# Patient Record
Sex: Male | Born: 1960 | Race: White | Hispanic: No | Marital: Single | Smoking: Never smoker
Health system: Southern US, Community
[De-identification: ages and names within clinical notes are randomized; demographics above are authoritative.]

## PROBLEM LIST (undated history)

## (undated) ENCOUNTER — Emergency Department (HOSPITAL_COMMUNITY): Payer: Self-pay

## (undated) DIAGNOSIS — Z973 Presence of spectacles and contact lenses: Secondary | ICD-10-CM

## (undated) DIAGNOSIS — M545 Low back pain, unspecified: Secondary | ICD-10-CM

## (undated) DIAGNOSIS — S37019A Minor contusion of unspecified kidney, initial encounter: Secondary | ICD-10-CM

## (undated) DIAGNOSIS — F419 Anxiety disorder, unspecified: Secondary | ICD-10-CM

## (undated) DIAGNOSIS — F319 Bipolar disorder, unspecified: Secondary | ICD-10-CM

## (undated) DIAGNOSIS — M25471 Effusion, right ankle: Secondary | ICD-10-CM

## (undated) DIAGNOSIS — G8929 Other chronic pain: Secondary | ICD-10-CM

## (undated) DIAGNOSIS — G4730 Sleep apnea, unspecified: Secondary | ICD-10-CM

## (undated) DIAGNOSIS — I5032 Chronic diastolic (congestive) heart failure: Secondary | ICD-10-CM

## (undated) DIAGNOSIS — K219 Gastro-esophageal reflux disease without esophagitis: Secondary | ICD-10-CM

## (undated) DIAGNOSIS — G4700 Insomnia, unspecified: Secondary | ICD-10-CM

## (undated) DIAGNOSIS — M25474 Effusion, right foot: Secondary | ICD-10-CM

## (undated) DIAGNOSIS — I10 Essential (primary) hypertension: Secondary | ICD-10-CM

## (undated) DIAGNOSIS — B1920 Unspecified viral hepatitis C without hepatic coma: Secondary | ICD-10-CM

## (undated) DIAGNOSIS — M25472 Effusion, left ankle: Secondary | ICD-10-CM

## (undated) DIAGNOSIS — Z86010 Personal history of colonic polyps: Secondary | ICD-10-CM

## (undated) DIAGNOSIS — R519 Headache, unspecified: Secondary | ICD-10-CM

## (undated) DIAGNOSIS — M25475 Effusion, left foot: Secondary | ICD-10-CM

## (undated) DIAGNOSIS — R51 Headache: Secondary | ICD-10-CM

## (undated) DIAGNOSIS — R296 Repeated falls: Secondary | ICD-10-CM

## (undated) DIAGNOSIS — R413 Other amnesia: Secondary | ICD-10-CM

## (undated) DIAGNOSIS — D131 Benign neoplasm of stomach: Secondary | ICD-10-CM

## (undated) HISTORY — PX: COLONOSCOPY: SHX174

## (undated) HISTORY — DX: Benign neoplasm of stomach: D13.1

## (undated) HISTORY — PX: FRACTURE SURGERY: SHX138

## (undated) HISTORY — DX: Bipolar disorder, unspecified: F31.9

## (undated) HISTORY — DX: Insomnia, unspecified: G47.00

## (undated) HISTORY — DX: Personal history of colonic polyps: Z86.010

## (undated) HISTORY — DX: Sleep apnea, unspecified: G47.30

---
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
## 1999-04-24 HISTORY — PX: ANKLE FRACTURE SURGERY: SHX122

## 1999-04-24 HISTORY — PX: FOREARM SURGERY: SHX651

## 2011-11-07 ENCOUNTER — Emergency Department (HOSPITAL_COMMUNITY): Payer: Self-pay

## 2011-11-07 ENCOUNTER — Emergency Department (HOSPITAL_COMMUNITY)
Admission: EM | Admit: 2011-11-07 | Discharge: 2011-11-07 | Disposition: A | Discharge status: Home / Self Care | Payer: Self-pay | Attending: Emergency Medicine | Admitting: Emergency Medicine

## 2011-11-07 ENCOUNTER — Encounter (HOSPITAL_COMMUNITY): Payer: Self-pay | Admitting: *Deleted

## 2011-11-07 DIAGNOSIS — K219 Gastro-esophageal reflux disease without esophagitis: Secondary | ICD-10-CM | POA: Insufficient documentation

## 2011-11-07 DIAGNOSIS — M542 Cervicalgia: Secondary | ICD-10-CM | POA: Insufficient documentation

## 2011-11-07 DIAGNOSIS — M549 Dorsalgia, unspecified: Secondary | ICD-10-CM | POA: Insufficient documentation

## 2011-11-07 DIAGNOSIS — B1920 Unspecified viral hepatitis C without hepatic coma: Secondary | ICD-10-CM | POA: Insufficient documentation

## 2011-11-07 DIAGNOSIS — M1990 Unspecified osteoarthritis, unspecified site: Secondary | ICD-10-CM

## 2011-11-07 DIAGNOSIS — W19XXXA Unspecified fall, initial encounter: Secondary | ICD-10-CM

## 2011-11-07 HISTORY — DX: Unspecified viral hepatitis C without hepatic coma: B19.20

## 2011-11-07 HISTORY — DX: Gastro-esophageal reflux disease without esophagitis: K21.9

## 2011-11-07 MED ORDER — CEPHALEXIN 500 MG PO CAPS: 500.0000 mg | ORAL_CAPSULE | Freq: Four times a day (QID) | ORAL | Status: DC

## 2011-11-07 MED ORDER — CARISOPRODOL 350 MG PO TABS: 350.0000 mg | ORAL_TABLET | Freq: Three times a day (TID) | ORAL | Status: AC

## 2011-11-07 MED ORDER — HYDROCODONE-ACETAMINOPHEN 10-325 MG PO TABS: 1.0000 | ORAL_TABLET | Freq: Three times a day (TID) | ORAL | Status: DC | PRN

## 2011-11-07 NOTE — ED Provider Notes (Signed)
History   This chart was scribed for Dot Lanes, MD by Kathreen Cornfield. The patient was seen in room TR07C/TR07C and the patient's care was started at 11:06 AM     CSN: [id number]  Arrival date & time 11/07/11  1011   First MD Initiated Contact with Patient 11/07/11 1105      Chief Complaint  Patient presents with  . Fall  . Back Pain  . Neck Pain    (Consider location/radiation/quality/duration/timing/severity/associated sxs/prior treatment) Patient is a 51 y.o. male presenting with fall, back pain, and neck pain. The history is provided by the patient. No language interpreter was used.  Fall The accident occurred more than 1 week ago. The fall occurred in unknown circumstances. He fell from an unknown height. He landed on a hard floor. There was no blood loss. The point of impact was the head. The pain is present in the neck, right shoulder and left shoulder. The pain is moderate. He was ambulatory at the scene. There was no entrapment after the fall. There was no drug use involved in the accident. There was no alcohol use involved in the accident. Pertinent negatives include no fever, no bowel incontinence, no vomiting and no loss of consciousness. The symptoms are aggravated by extension. He has tried nothing for the symptoms. The treatment provided no relief.  Back Pain  This is a new problem. The current episode started more than 1 week ago. The problem occurs constantly. The problem has been gradually worsening. The pain is associated with falling. The pain is moderate. The symptoms are aggravated by certain positions. The pain is the same all the time. Pertinent negatives include no fever, no bowel incontinence and no bladder incontinence. He has tried nothing for the symptoms. The treatment provided no relief.  Neck Pain  This is a new problem. The current episode started more than 1 week ago. The problem occurs constantly. The problem has been gradually worsening. The pain is  associated with a fall. There has been no fever. The pain is moderate. The pain is the same all the time. Pertinent negatives include no bowel incontinence and no bladder incontinence. He has tried nothing for the symptoms. The treatment provided no relief.    Christian Guerra is a 51 y.o. male who presents to the Emergency Department complaining of moderate, episodic fall onset three weeks ago with associated symptoms of left sided neck pain, radiating  shoulder pain. The pt informs the EDP that he is experiencing neck pain which radiates down into his shoulder blades. Modifying factors include certain positions or movements which intensify the pain. Pt also complains of moderate, episodic rash onset three weeks ago.   Pt denies loss of bowel control.  The pt is visiting from [place].      Past Medical History  Diagnosis Date  . Hepatitis C   . GERD (gastroesophageal reflux disease)     Past Surgical History  Procedure Date  . Appendectomy   . Ankle surgery   . Cosmetic surgery     No family history on file.  History  Substance Use Topics  . Smoking status: Never Smoker   . Smokeless tobacco: Not on file  . Alcohol Use: Yes      Review of Systems  Constitutional: Negative for fever.  HENT: Positive for neck pain.   Gastrointestinal: Negative for vomiting and bowel incontinence.  Genitourinary: Negative for bladder incontinence.  Musculoskeletal: Positive for back pain.  Neurological: Negative for loss of consciousness.  All other systems reviewed and are negative.   10 Systems reviewed and all are negative for acute change except as noted in the HPI.    Allergies  Codeine  Home Medications   Current Outpatient Rx  Name Route Sig Dispense Refill  . CELECOXIB 200 MG PO CAPS Oral Take 200 mg by mouth daily.    Marland Kitchen CLINDAMYCIN PHOSPHATE 1 % EX LOTN Topical Apply 1 application topically 2 (two) times daily.    Marland Kitchen DIPHENHYDRAMINE HCL 25 MG PO TABS Oral Take 25 mg by  mouth every 6 (six) hours as needed. For allergies    . HYDROCODONE-ACETAMINOPHEN 10-325 MG PO TABS Oral Take 1 tablet by mouth every 8 (eight) hours as needed.    . IBUPROFEN 200 MG PO TABS Oral Take 800 mg by mouth every 6 (six) hours as needed. For pain    . OMEPRAZOLE 40 MG PO CPDR Oral Take 40 mg by mouth daily.    . SERTRALINE HCL 50 MG PO TABS Oral Take 50 mg by mouth daily.    Marland Kitchen TIZANIDINE HCL 4 MG PO TABS Oral Take 4 mg by mouth every 8 (eight) hours as needed.      BP 160/86  Pulse 93  Temp 98.3 F (36.8 C) (Oral)  Resp 16  SpO2 100%  Physical Exam  Nursing note and vitals reviewed. Constitutional: He is oriented to person, place, and time. He appears well-developed and well-nourished.  HENT:  Head: Atraumatic.  Nose: Nose normal.  Neck:    Pulmonary/Chest: Effort normal.  Neurological: He is alert and oriented to person, place, and time. He has normal strength. No sensory deficit. GCS eye subscore is 4. GCS verbal subscore is 5. GCS motor subscore is 6.  Skin: Skin is warm and dry.  Psychiatric: He has a normal mood and affect. His behavior is normal.    ED Course  Procedures (including critical care time)  DIAGNOSTIC STUDIES: Oxygen Saturation is 100% on room air, normal by my interpretation.    COORDINATION OF CARE:     11:09AM- EDP at bedside discusses treatment plan concerning x-rays and pain management.   Labs Reviewed - No data to display No results found.   No diagnosis found.    MDM        I personally performed the services described in this documentation, which was scribed in my presence. The recorded information has been reviewed and considered.     Dot Lanes, MD 11/07/11 [phone]

## 2011-11-07 NOTE — ED Notes (Signed)
Patient states he fell 3 weeks ago.  [address] face forward and hit his face.  He states since the fall his neck and both shoulders are hurting since the fall.  He also complaining of back pain from his neck to mid back.  Patient is also concerned about "jock itch"  He is also requesting a refill on his keflex

## 2011-11-15 ENCOUNTER — Encounter (HOSPITAL_COMMUNITY): Payer: Self-pay | Admitting: Emergency Medicine

## 2011-11-15 ENCOUNTER — Emergency Department (HOSPITAL_COMMUNITY)
Admission: EM | Admit: 2011-11-15 | Discharge: 2011-11-15 | Disposition: A | Discharge status: Home / Self Care | Payer: Self-pay | Attending: Emergency Medicine | Admitting: Emergency Medicine

## 2011-11-15 ENCOUNTER — Emergency Department (HOSPITAL_COMMUNITY): Payer: Self-pay

## 2011-11-15 DIAGNOSIS — S139XXA Sprain of joints and ligaments of unspecified parts of neck, initial encounter: Secondary | ICD-10-CM | POA: Insufficient documentation

## 2011-11-15 DIAGNOSIS — Z79899 Other long term (current) drug therapy: Secondary | ICD-10-CM | POA: Insufficient documentation

## 2011-11-15 DIAGNOSIS — M542 Cervicalgia: Secondary | ICD-10-CM | POA: Insufficient documentation

## 2011-11-15 DIAGNOSIS — K219 Gastro-esophageal reflux disease without esophagitis: Secondary | ICD-10-CM | POA: Insufficient documentation

## 2011-11-15 DIAGNOSIS — S161XXA Strain of muscle, fascia and tendon at neck level, initial encounter: Secondary | ICD-10-CM

## 2011-11-15 DIAGNOSIS — W010XXA Fall on same level from slipping, tripping and stumbling without subsequent striking against object, initial encounter: Secondary | ICD-10-CM | POA: Insufficient documentation

## 2011-11-15 DIAGNOSIS — Z9089 Acquired absence of other organs: Secondary | ICD-10-CM | POA: Insufficient documentation

## 2011-11-15 MED ORDER — OXYCODONE HCL 5 MG PO TABS
10.0000 mg | ORAL_TABLET | Freq: Once | ORAL | Status: AC
  Administered 2011-11-15: 10 mg via ORAL
  Filled 2011-11-15: qty 2

## 2011-11-15 MED ORDER — OXYCODONE HCL 5 MG PO CAPS: 5.0000 mg | ORAL_CAPSULE | ORAL | Status: AC | PRN

## 2011-11-15 MED ORDER — NYSTATIN-TRIAMCINOLONE 100000-0.1 UNIT/GM-% EX CREA: TOPICAL_CREAM | CUTANEOUS | Status: DC

## 2011-11-15 NOTE — ED Provider Notes (Signed)
History  This chart was scribed for Orlie Dakin, MD by Lovena Le Day. This patient was seen in room TR08C/TR08C and the patient's care was started at 1105.   CSN: [id number]  Arrival date & time 11/15/11  1105   None     Chief Complaint  Patient presents with  . Back Pain  . Neck Pain    The history is provided by the patient. No language interpreter was used.   Christian Guerra is a 51 y.o. male who presents to the Emergency Department complaining of constant neck/back pain after he fell four weeks ago , when he tripped and fell, landing on his face. Complains of posterior neck pain and mid thoracic back pain. Pain is nonradiating seen here 11/07/2011 had plain films of cervical spine prescribed Vicodin which she states gives him no relief. No loss of bladder or bowel control no focal numbness or weakness while walking out of his shed. He is currently moving here from [place] and has no PCP yet. He states has tried using ice packs at home without any relief from pain and trouble sleeping. He also states multiple itchy insect bites to his BLE from two weeks ago and using OTC topical treatments. He denies any medical history. He does not smoke and states quit drinking about a week and a half ago.      Past Medical History  Diagnosis Date  . Hepatitis C   . GERD (gastroesophageal reflux disease)     Past Surgical History  Procedure Date  . Appendectomy   . Ankle surgery   . Cosmetic surgery     No family history on file.  History  Substance Use Topics  . Smoking status: Never Smoker   . Smokeless tobacco: Not on file  . Alcohol Use: Yes      Review of Systems  Constitutional: Negative.  Negative for fever.  HENT: Positive for neck pain.   Respiratory: Negative.   Cardiovascular: Negative.  Negative for chest pain.  Gastrointestinal: Negative.  Negative for abdominal pain.  Musculoskeletal: Positive for back pain.  Skin: Positive for rash. Wound: multiple insect bites  BLE.       Insect bites over lower legs which are improving with time treated with peroxide. Also complains of jock itch for several days.  Neurological: Negative.   Hematological: Negative.   Psychiatric/Behavioral: Negative.     Allergies  Codeine  Home Medications   Current Outpatient Rx  Name Route Sig Dispense Refill  . BC FAST PAIN RELIEF PO Oral Take 1 packet by mouth 2 (two) times daily as needed. For pain    . CARISOPRODOL 350 MG PO TABS Oral Take 1 tablet (350 mg total) by mouth 3 (three) times daily. 30 tablet 0  . CELECOXIB 200 MG PO CAPS Oral Take 200 mg by mouth daily.    Marland Kitchen CLINDAMYCIN PHOSPHATE 1 % EX LOTN Topical Apply 1 application topically 2 (two) times daily.    Marland Kitchen DIPHENHYDRAMINE HCL 25 MG PO TABS Oral Take 25 mg by mouth every 6 (six) hours as needed. For allergies    . FAMOTIDINE 10 MG PO TABS Oral Take 10 mg by mouth 2 (two) times daily.    Marland Kitchen HYDROCODONE-ACETAMINOPHEN 10-325 MG PO TABS Oral Take 1 tablet by mouth every 8 (eight) hours as needed. 30 tablet 0  . IBUPROFEN 200 MG PO TABS Oral Take 800 mg by mouth every 6 (six) hours as needed. For pain    . BENGAY ULTRA STRENGTH  EX Apply externally Apply 1 application topically 4 (four) times daily.    Marland Kitchen BACITRACIN-NEOMYCIN-POLYMYXIN 400-08-4998 EX OINT Topical Apply 1 application topically every 12 (twelve) hours.    . NYSTATIN-TRIAMCINOLONE 100000-0.1 UNIT/GM-% EX CREA Topical Apply 1 application topically 2 (two) times daily.    Marland Kitchen OMEPRAZOLE 40 MG PO CPDR Oral Take 40 mg by mouth daily.      BP 156/88  Pulse 107  Temp 97.8 F (36.6 C) (Oral)  Resp 16  SpO2 96%  Physical Exam  Nursing note and vitals reviewed. Constitutional: He appears well-developed and well-nourished. He appears distressed (appears uncomfortable).       Appears uncomfortable Glasgow Coma Score 15  HENT:  Head: Normocephalic and atraumatic.  Eyes: Conjunctivae are normal. Pupils are equal, round, and reactive to light.  Neck: Neck  supple. No tracheal deviation present. No thyromegaly present.  Cardiovascular: Normal rate and regular rhythm.   No murmur heard. Pulmonary/Chest: Effort normal and breath sounds normal.  Abdominal: Soft. Bowel sounds are normal. He exhibits no distension. There is no tenderness.       Obese  Musculoskeletal: Normal range of motion. He exhibits tenderness. He exhibits no edema.       Tender over cervical spine tender over mid thoracic spine lumbar spine nontender pelvis stable  Neurological: He is alert. He displays normal reflexes. Coordination normal.       Gait normal  Skin: Skin is warm and dry. No rash noted.       Pinkish rash in groin consistent with tinea cruris. Pinkish papules on lower legs consistent with healing insect bites.  Psychiatric: He has a normal mood and affect.    ED Course  Procedures (including critical care time) DIAGNOSTIC STUDIES: Oxygen Saturation is 96% on room air, adequate by my interpretation.    COORDINATION OF CARE: At 1210 PM Discussed treatment plan with patient which includes CT head/neck. Patient agrees.   Labs Reviewed - No data to display No results found.  1 50 p.m. feels improved after treatment with oxycodone. X-rays reviewed by me. No diagnosis found.    MDM  Plan prescription oxyir, Mycolog II cream referral resource guide, blood pressure recheck  Diagnosis #1 cervical strain #2 thoracic back pain #3 tinea cruris #4 elevated blood pressure   I personally performed the services described in this documentation, which was scribed in my presence. The recorded information has been reviewed and considered.        Orlie Dakin, MD 11/15/11 1355

## 2011-11-15 NOTE — ED Notes (Signed)
Pt reports fall 4 week ago continues to have neck and back pain. Pt reports using ice packs and heating pad without relief. Pt also reports not able to sleep. Pt has multiple bite marks from bugs on B/L legs onset 2 weeks ago. Pt also requesting medication for jock itch.

## 2011-11-15 NOTE — ED Notes (Signed)
Patient seen of [facility] for same injury, given pain medications and is now out of medications and has no family doctor.  Continues to have pain in the shoulder blade area and radiates to neck.  Continues to have back pain as well.

## 2011-11-16 MED ORDER — TRIAMCINOLONE ACETONIDE 0.1 % EX CREA: TOPICAL_CREAM | Freq: Two times a day (BID) | CUTANEOUS | Status: DC

## 2011-11-16 MED ORDER — NYSTATIN 100000 UNIT/GM EX CREA: TOPICAL_CREAM | CUTANEOUS | Status: DC

## 2011-11-22 ENCOUNTER — Ambulatory Visit: Payer: Self-pay | Admitting: Family

## 2012-09-19 ENCOUNTER — Ambulatory Visit: Payer: No Typology Code available for payment source | Attending: Internal Medicine | Admitting: Internal Medicine

## 2012-09-19 ENCOUNTER — Encounter: Payer: Self-pay | Admitting: Internal Medicine

## 2012-09-19 VITALS — BP 122/81 | HR 72 | Temp 98.4°F | Resp 18 | Wt 278.0 lb

## 2012-09-19 DIAGNOSIS — Z9119 Patient's noncompliance with other medical treatment and regimen: Secondary | ICD-10-CM | POA: Insufficient documentation

## 2012-09-19 DIAGNOSIS — G4733 Obstructive sleep apnea (adult) (pediatric): Secondary | ICD-10-CM | POA: Insufficient documentation

## 2012-09-19 DIAGNOSIS — B1920 Unspecified viral hepatitis C without hepatic coma: Secondary | ICD-10-CM | POA: Insufficient documentation

## 2012-09-19 DIAGNOSIS — I10 Essential (primary) hypertension: Secondary | ICD-10-CM | POA: Insufficient documentation

## 2012-09-19 DIAGNOSIS — G894 Chronic pain syndrome: Secondary | ICD-10-CM | POA: Insufficient documentation

## 2012-09-19 DIAGNOSIS — G8929 Other chronic pain: Secondary | ICD-10-CM

## 2012-09-19 DIAGNOSIS — M549 Dorsalgia, unspecified: Secondary | ICD-10-CM

## 2012-09-19 DIAGNOSIS — Z91199 Patient's noncompliance with other medical treatment and regimen due to unspecified reason: Secondary | ICD-10-CM | POA: Insufficient documentation

## 2012-09-19 DIAGNOSIS — B182 Chronic viral hepatitis C: Secondary | ICD-10-CM | POA: Insufficient documentation

## 2012-09-19 DIAGNOSIS — N63 Unspecified lump in unspecified breast: Secondary | ICD-10-CM

## 2012-09-19 DIAGNOSIS — Z79899 Other long term (current) drug therapy: Secondary | ICD-10-CM | POA: Insufficient documentation

## 2012-09-19 DIAGNOSIS — K219 Gastro-esophageal reflux disease without esophagitis: Secondary | ICD-10-CM | POA: Insufficient documentation

## 2012-09-19 DIAGNOSIS — Z0000 Encounter for general adult medical examination without abnormal findings: Secondary | ICD-10-CM | POA: Insufficient documentation

## 2012-09-19 MED ORDER — GABAPENTIN 300 MG PO CAPS: 300.0000 mg | ORAL_CAPSULE | Freq: Three times a day (TID) | ORAL | Status: DC

## 2012-09-19 MED ORDER — TIZANIDINE HCL 4 MG PO CAPS: 4.0000 mg | ORAL_CAPSULE | Freq: Three times a day (TID) | ORAL | Status: DC

## 2012-09-19 MED ORDER — LISINOPRIL 10 MG PO TABS: 10.0000 mg | ORAL_TABLET | Freq: Every day | ORAL | Status: DC
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
## 2012-09-19 NOTE — Progress Notes (Signed)
Patient here to establish care Medication refills

## 2012-09-22 NOTE — Addendum Note (Signed)
Addended by: Jonetta Osgood on: 09/22/2012 10:25 AM   Modules accepted: Orders

## 2012-09-23 ENCOUNTER — Ambulatory Visit: Payer: No Typology Code available for payment source | Attending: Family Medicine

## 2012-09-23 DIAGNOSIS — K219 Gastro-esophageal reflux disease without esophagitis: Secondary | ICD-10-CM

## 2012-09-23 DIAGNOSIS — I10 Essential (primary) hypertension: Secondary | ICD-10-CM

## 2012-09-23 DIAGNOSIS — G8929 Other chronic pain: Secondary | ICD-10-CM

## 2012-09-24 LAB — COMPREHENSIVE METABOLIC PANEL
ALT: 17 U/L (ref 0–53)
AST: 14 U/L (ref 0–37)
Albumin: 4 g/dL (ref 3.5–5.2)
Calcium: 8.7 mg/dL (ref 8.4–10.5)
Chloride: 99 mEq/L (ref 96–112)
Creat: 0.99 mg/dL (ref 0.50–1.35)
Potassium: 4.5 mEq/L (ref 3.5–5.3)
Sodium: 136 mEq/L (ref 135–145)
Total Protein: 6.9 g/dL (ref 6.0–8.3)

## 2012-09-24 LAB — LIPID PANEL
Cholesterol: 152 mg/dL (ref 0–200)
Total CHOL/HDL Ratio: 4 Ratio
VLDL: 23 mg/dL (ref 0–40)

## 2012-09-24 LAB — CBC
Platelets: 398 10*3/uL (ref 150–400)
RDW: 15.1 % (ref 11.5–15.5)
WBC: 9.7 10*3/uL (ref 4.0–10.5)

## 2012-09-24 LAB — HEPATITIS PANEL, ACUTE: Hep A IgM: NEGATIVE

## 2012-09-25 ENCOUNTER — Telehealth: Payer: Self-pay | Admitting: Family Medicine

## 2012-09-25 NOTE — Telephone Encounter (Signed)
Question about order for Dr. Ivor Reining

## 2012-09-26 ENCOUNTER — Encounter: Payer: Self-pay | Admitting: Internal Medicine

## 2012-09-26 ENCOUNTER — Ambulatory Visit: Payer: No Typology Code available for payment source | Admitting: Family Medicine

## 2012-09-26 ENCOUNTER — Ambulatory Visit (INDEPENDENT_AMBULATORY_CARE_PROVIDER_SITE_OTHER): Payer: No Typology Code available for payment source | Admitting: Internal Medicine

## 2012-09-26 VITALS — BP 130/80 | HR 90 | Temp 98.9°F | Ht 64.5 in | Wt 278.0 lb

## 2012-09-26 VITALS — BP 125/79 | HR 85 | Temp 98.2°F | Resp 16

## 2012-09-26 DIAGNOSIS — F518 Other sleep disorders not due to a substance or known physiological condition: Secondary | ICD-10-CM

## 2012-09-26 DIAGNOSIS — G8929 Other chronic pain: Secondary | ICD-10-CM | POA: Insufficient documentation

## 2012-09-26 DIAGNOSIS — IMO0001 Reserved for inherently not codable concepts without codable children: Secondary | ICD-10-CM

## 2012-09-26 DIAGNOSIS — M549 Dorsalgia, unspecified: Secondary | ICD-10-CM | POA: Insufficient documentation

## 2012-09-26 DIAGNOSIS — I10 Essential (primary) hypertension: Secondary | ICD-10-CM

## 2012-09-26 DIAGNOSIS — Z72821 Inadequate sleep hygiene: Secondary | ICD-10-CM

## 2012-09-26 LAB — CK: Total CK: 119 U/L (ref 7–232)

## 2012-09-26 NOTE — Progress Notes (Signed)
  Subjective:    Patient ID: Christian Guerra, male    DOB: 1961/02/14  MRN: [id number]  HPI  86 yowm never smoker referred by Dr Jonnie Finner 09/26/2012 to pulmonary clinic for eval of ? Sleep disorder  09/26/2012 1st pulmonary ov cc poor sleep since 1992 rec eval by sleep center in [place] but never completed it. 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  Since onset of this sleeping pattern has also developed   aching all over and requiring large doses of hydrocodone but actually denies hypersomnolence.  No obvious daytime variabilty or assoc chronic cough or cp or chest tightness, subjective wheeze overt sinus or hb symptoms. No unusual exp hx or h/o childhood pna/ asthma or premature birth to his knowledge.   Review of Systems  Constitutional: Positive for appetite change. Negative for fever, chills, activity change and unexpected weight change.  HENT: Negative for congestion, sore throat, rhinorrhea, sneezing, trouble swallowing, dental problem, voice change and postnasal drip.   Eyes: Negative for visual disturbance.  Respiratory: Negative for cough, choking and shortness of breath.   Cardiovascular: Negative for chest pain and leg swelling.  Gastrointestinal: Negative for nausea, vomiting and abdominal pain.  Genitourinary: Negative for difficulty urinating.       Acid Heartburn Indigestion  Musculoskeletal: Positive for arthralgias.  Skin: Positive for rash.  Psychiatric/Behavioral: Negative for behavioral problems and confusion.       Objective:   Physical Exam  Wt Readings from Last 3 Encounters:  09/26/12 278 lb (126.1 kg)  09/19/12 278 lb (126.1 kg)    amb obese wm nad  wnofailed to answer a single question asked in a straightforward manner, tending to go off on tangents or answer questions with ambiguous medical terms or diagnoses and seemed aggravated  when asked the same  question more than once for clarification.   HEENT: nl dentition, turbinates, and orophanx. Nl external ear canals without cough reflex   NECK :  without JVD/Nodes/TM/ nl carotid upstrokes bilaterally   LUNGS: no acc muscle use, clear to A and P bilaterally without cough on insp or exp maneuvers   CV:  RRR  no s3 or murmur or increase in P2, no edema   ABD:  soft and nontender with nl excursion in the supine position. No bruits or organomegaly, bowel sounds nl  MS:  warm without deformities, calf tenderness, cyanosis or clubbing  SKIN: warm and dry without lesions    NEURO:  alert, approp, no deficits        Assessment & Plan:

## 2012-09-26 NOTE — Patient Instructions (Signed)
Polymyositis Polymyositis is one type of inflammatory myopathy. Inflammatory myopathies are muscle diseases that involve redness, soreness, and swelling (inflammation) of the muscles. Polymyositis causes decreased muscle power. It affects the connective tissues of the body (connective tissue disease). It is often associated with diseases in which the body attacks its own cells (autoimmune diseases). Polymyositis begins gradually. It generally affects adults in their 30s to 8s. CAUSES  The cause is often unknown. Some cases are caused by a bacteria, virus, or parasite infection. This can trigger autoantibodies, which attack normal, healthy cells. SYMPTOMS   The most common symptom is muscle weakness. This often affects muscles close to the trunk of the body (proximal). Muscles not close to the trunk (distal) may also be affected later on in the disease.  Eventually, patients have trouble:  Rising from a sitting position.  Climbing stairs.  Lifting objects.  Reaching overhead.  Trouble swallowing (dysphagia) may occur.  Rarely, the muscles ache and are tender to touch. Associated conditions include:  Fingers, toes, nose, and ears that turn pale when exposed to cold (Raynaud's phenomenon).  Other connective tissue diseases. This may include lupus, rheumatoid arthritis, or scleroderma.  Heart disease. This is caused by inflammation of the heart muscle. DIAGNOSIS  Diagnosis can be difficult. It is based on symptoms and a thorough exam. Further testing may be done, including:  Blood tests to check for high levels of muscle enzymes, which suggest muscle damage, and specific autoantibodies.  Computerized magnetic scan (MRI).  Test to check electrical activity of the muscle (electromyography).  Exam of a muscle tissue sample (biopsy). TREATMENT  There is no cure. Treatment can improve muscle strength and function. The earlier treatment is started, the more effective it is. Early  treatment often means fewer complications.  Drug treatments may include:  Corticosteroids, especially prednisone. Patients often improve in about 2 to 4 weeks. However, drug therapy may be needed for years. Long-term use of corticosteroids can have serious side effects. Your caregiver may advise supplements or may prescribe other medicines.  Corticosteroid-sparing agents.  Intravenous immunoglobulin (IVIG). This contains healthy antibodies from blood donors.  Immunosuppressive therapies, such as tacrolimus.  Physical therapy is often advised. This helps stop muscles from wasting away (atrophy).  Chewing and swallowing can be more difficult later on in the disease. A registered dietitian can teach you how to make foods that are easy to eat.  Speech therapy can be helpful if your swallowing muscles are weakened. Results from therapy vary depending on the severity of the disease. Rarely, the disease may be life-threatening to patients with severe, progressive muscle weakness, dysphagia, malnutrition, pneumonia, or respiratory failure. HOME CARE INSTRUCTIONS   Stay active. An exercise routine can help you build and maintain muscle strength. It is important to get a detailed plan and recommendations from your caregiver or physical therapist before starting an exercise program.  Rest when you are tired. Do not wait until you are exhausted to rest. Learn to pace yourself. SEEK MEDICAL CARE IF:  You develop new or worsening muscle weakness. SEEK IMMEDIATE MEDICAL CARE IF:   You have trouble swallowing or speaking.  You develop shortness of breath. FOR MORE INFORMATION  The Myositis Association: www.myositis.[facility] of Neurological Disorders and Stroke: [employer].it Document Released: 03/30/2002 Document Revised: 07/02/2011 Document Reviewed: 08/11/2009 [facility] Patient Information 2014 [place], [place]. Myalgia, Adult Myalgia is the medical term for muscle pain. It is a  symptom of many things. Nearly everyone at some time in their life has this.  The most common cause for muscle pain is overuse or straining and more so when you are not in shape. Injuries and muscle bruises cause myalgias. Muscle pain without a history of injury can also be caused by a virus. It frequently comes along with the flu. Myalgia not caused by muscle strain can be present in a large number of infectious diseases. Some autoimmune diseases like lupus and fibromyalgia can cause muscle pain. Myalgia may be mild, or severe. SYMPTOMS  The symptoms of myalgia are simply muscle pain. Most of the time this is short lived and the pain goes away without treatment. DIAGNOSIS  Myalgia is diagnosed by your caregiver by taking your history. This means you tell him when the problems began, what they are, and what has been happening. If this has not been a long term problem, your caregiver may want to watch for a while to see what will happen. If it has been long term, they may want to do additional testing. TREATMENT  The treatment depends on what the underlying cause of the muscle pain is. Often anti-inflammatory medications will help. HOME CARE INSTRUCTIONS  If the pain in your muscles came from overuse, slow down your activities until the problems go away.  Myalgia from overuse of a muscle can be treated with alternating hot and cold packs on the muscle affected or with cold for the first couple days. If either heat or cold seems to make things worse, stop their use.  Apply ice to the sore area for 15-20 minutes, 3-4 times per day, while awake for the first 2 days of muscle soreness, or as directed. Put the ice in a plastic bag and place a towel between the bag of ice and your skin.  Only take over-the-counter or prescription medicines for pain, discomfort, or fever as directed by your caregiver.  Regular gentle exercise may help if you are not active.  Stretching before strenuous exercise can help  lower the risk of myalgia. It is normal when beginning an exercise regimen to feel some muscle pain after exercising. Muscles that have not been used frequently will be sore at first. If the pain is extreme, this may mean injury to a muscle. SEEK MEDICAL CARE IF:  You have an increase in muscle pain that is not relieved with medication.  You begin to run a temperature.  You develop nausea and vomiting.  You develop a stiff and painful neck.  You develop a rash.  You develop muscle pain after a tick bite.  You have continued muscle pain while working out even after you are in good condition. SEEK IMMEDIATE MEDICAL CARE IF: Any of your problems are getting worse and medications are not helping. MAKE SURE YOU:   Understand these instructions.  Will watch your condition.  Will get help right away if you are not doing well or get worse. Document Released: 03/01/2006 Document Revised: 07/02/2011 Document Reviewed: 05/21/2006 [facility] Patient Information 2014 [place], [place].

## 2012-09-26 NOTE — Progress Notes (Signed)
Follow up Lab results

## 2012-09-26 NOTE — Patient Instructions (Addendum)
Weight control is simply a matter of calorie balance which needs to be tilted in your favor by eating less and exercising more.  To get the most out of exercise, you need to be continuously aware that you are short of breath, but never out of breath, for 30 minutes daily. As you improve, it will actually be easier for you to do the same amount of exercise  in  30 minutes so always push to the level where you are short of breath.  If this does not result in gradual weight reduction then I strongly recommend you see a nutritionist with a food diary x 2 weeks so that we can work out a negative calorie balance which is universally effective in steady weight loss programs.  Think of your calorie balance like you do your bank account where in this case you want the balance to go down so you must take in less calories than you burn up.  It's just that simple:  Hard to do, but easy to understand.  Good luck!   When you wake, try reading with a dull light for 30 min and try to back bed   If your throat clearing gets worse I strongly advise you get off ace inhibitor (lisinopril) for at least a 4 week before the sleep study which you can schedule by calling  [facility] at [phone] .

## 2012-09-26 NOTE — Progress Notes (Signed)
Patient ID: Christian Guerra, male   DOB: June 22, 1960, 52 y.o.   MRN: [id number]  CC: follow up   HPI: Pt says that he is aching all over his body. He is concerned that he may have been poisoned with heavy metals.  Pt says that he is very concerned.  Pt says he is severely fatigued.  He wants to be checked for possible heavy metal poisoning.    Allergies  Allergen Reactions  . Codeine Itching   Past Medical History  Diagnosis Date  . Hepatitis C   . GERD (gastroesophageal reflux disease)    Current Outpatient Prescriptions on File Prior to Visit  Medication Sig Dispense Refill  . Aspirin-Caffeine (BC FAST PAIN RELIEF PO) Take 1 packet by mouth 2 (two) times daily as needed. For pain      . Cholecalciferol (VITAMIN D-3 PO) Take by mouth.      . doxycycline (VIBRAMYCIN) 100 MG capsule Take 100 mg by mouth 2 (two) times daily.      Marland Kitchen gabapentin (NEURONTIN) 300 MG capsule Take 1 capsule (300 mg total) by mouth 3 (three) times daily.  90 capsule  3  . HYDROcodone-acetaminophen (NORCO) 10-325 MG per tablet Take 1 tablet by mouth every 8 (eight) hours as needed.  30 tablet  0  . ibuprofen (ADVIL,MOTRIN) 200 MG tablet Take 800 mg by mouth every 6 (six) hours as needed. For pain      . lisinopril (PRINIVIL,ZESTRIL) 10 MG tablet Take 1 tablet (10 mg total) by mouth daily.  30 tablet  3  . Menthol, Topical Analgesic, (BENGAY ULTRA STRENGTH EX) Apply 1 application topically 4 (four) times daily.      Marland Kitchen neomycin-bacitracin-polymyxin (NEOSPORIN) ointment Apply 1 application topically every 12 (twelve) hours.      Marland Kitchen omeprazole (PRILOSEC) 20 MG capsule Take 20 mg by mouth daily.      Marland Kitchen tiZANidine (ZANAFLEX) 4 MG capsule Take 1 capsule (4 mg total) by mouth 3 (three) times daily.  90 capsule  3   No current facility-administered medications on file prior to visit.   No family history on file. History   Social History  . Marital Status: Single    Spouse Name: N/A    Number of Children: N/A  . Years of  Education: N/A   Occupational History  . Not on file.   Social History Main Topics  . Smoking status: Never Smoker   . Smokeless tobacco: Not on file  . Alcohol Use: Yes  . Drug Use: No  . Sexually Active:    Other Topics Concern  . Not on file   Social History Narrative  . No narrative on file    Review of Systems  Constitutional: Positive for activity change, appetite change and fatigue.  HENT: Negative for ear pain, nosebleeds, congestion, facial swelling, rhinorrhea, neck pain, neck stiffness and ear discharge.   Eyes: Negative for pain, discharge, redness, itching and visual disturbance.  Respiratory: Negative for cough, choking, chest tightness, shortness of breath, wheezing and stridor.   Cardiovascular: Negative for chest pain, palpitations and leg swelling.  Gastrointestinal: Negative for abdominal distention.  Genitourinary: Negative for dysuria, urgency, frequency, hematuria, flank pain, decreased urine volume, difficulty urinating and dyspareunia.  Musculoskeletal: body aches muscles aches and fatigue as in HPI above arthralgias and gait problem.  Neurological: Negative for dizziness, tremors, seizures, syncope, facial asymmetry, speech difficulty, weakness, light-headedness, numbness and headaches.  Hematological: Negative for adenopathy. Does not bruise/bleed easily.  Psychiatric/Behavioral: Negative for hallucinations,  behavioral problems, confusion, dysphoric mood, decreased concentration and agitation.    Objective:   Filed Vitals:   09/26/12 1141  BP: 125/79  Pulse: 85  Temp: 98.2 F (36.8 C)  Resp: 16    Physical Exam  Constitutional: Appears well-developed and well-nourished. No distress.  HENT: Normocephalic. External right and left ear normal. Oropharynx is clear and moist.  Eyes: Conjunctivae and EOM are normal. PERRLA, no scleral icterus.  Neck: Normal ROM. Neck supple. No JVD. No tracheal deviation. No thyromegaly.  CVS: RRR, S1/S2 +, no  murmurs, no gallops, no carotid bruit.  Pulmonary: Effort and breath sounds normal, no stridor, rhonchi, wheezes, rales.  Abdominal: Soft. BS +,  no distension, tenderness, rebound or guarding.  Musculoskeletal: Normal range of motion. No edema and positive muscle tenderness with palpation.  Lymphadenopathy: No lymphadenopathy noted, cervical, inguinal. Neuro: Alert. Normal reflexes, muscle tone coordination. No cranial nerve deficit. Skin: Skin is warm and dry. No rash noted. Not diaphoretic. No erythema. No pallor.  Psychiatric: Normal mood and affect. Behavior, judgment, thought content normal.   Lab Results  Component Value Date   WBC 9.7 09/23/2012   HGB 11.7* 09/23/2012   HCT 35.8* 09/23/2012   MCV 81.9 09/23/2012   PLT 398 09/23/2012   Lab Results  Component Value Date   CREATININE 0.99 09/23/2012   BUN 9 09/23/2012   NA 136 09/23/2012   K 4.5 09/23/2012   CL 99 09/23/2012   CO2 25 09/23/2012    Lab Results  Component Value Date   HGBA1C 5.4 09/23/2012   Lipid Panel     Component Value Date/Time   CHOL 152 09/23/2012 0910   TRIG 117 09/23/2012 0910   HDL 38* 09/23/2012 0910   CHOLHDL 4.0 09/23/2012 0910   VLDL 23 09/23/2012 0910   LDLCALC 91 09/23/2012 0910       Assessment and plan:   Patient Active Problem List   Diagnosis Date Noted  . Chronic back pain 09/19/2012  . Hepatitis C 09/19/2012  . GERD (gastroesophageal reflux disease) 09/19/2012  . HTN (hypertension) 09/19/2012   Reviewed labs with patient.  Check ESR, CK levels, check heavy metals screening per pt request  Follow lab results  Consider rheumatology referral if symptoms persist  Follow up in 4 months  C. Marisa Hua, MD, CDE, [place], [place]

## 2012-09-27 DIAGNOSIS — G4733 Obstructive sleep apnea (adult) (pediatric): Secondary | ICD-10-CM | POA: Insufficient documentation

## 2012-09-27 LAB — SEDIMENTATION RATE: Sed Rate: 34 mm/hr — ABNORMAL HIGH (ref 0–16)

## 2012-09-27 NOTE — Assessment & Plan Note (Signed)
Note slt hoarse with throat clearing on exam that may be acei related so rec trial off before considering any formal sleep study as it may interfere with upper airway function  Wt loss through neg cal balance also strongly encouraged to help his hbp and also possible osa

## 2012-09-27 NOTE — Assessment & Plan Note (Signed)
Although he is certainly at risk of osa, his problem is sleep quantity for sure, not sleep quality.  The problem of recurrent insomnia is discussed. Avoidance of caffeine sources is strongly encouraged. Sleep hygiene issues are reviewed.    See instructions for specific recommendations which were reviewed directly with the patient who was given a copy with highlighter outlining the key components.   Pulmonary f/u with one of our sleep doctors is prn

## 2012-09-29 ENCOUNTER — Ambulatory Visit
Admission: RE | Admit: 2012-09-29 | Discharge: 2012-09-29 | Disposition: A | Discharge status: Home / Self Care | Payer: No Typology Code available for payment source | Source: Ambulatory Visit | Attending: Internal Medicine | Admitting: Internal Medicine

## 2012-09-29 DIAGNOSIS — I10 Essential (primary) hypertension: Secondary | ICD-10-CM

## 2012-09-29 DIAGNOSIS — G8929 Other chronic pain: Secondary | ICD-10-CM

## 2012-09-29 DIAGNOSIS — N63 Unspecified lump in unspecified breast: Secondary | ICD-10-CM

## 2012-09-29 DIAGNOSIS — K219 Gastro-esophageal reflux disease without esophagitis: Secondary | ICD-10-CM

## 2012-09-29 DIAGNOSIS — M549 Dorsalgia, unspecified: Secondary | ICD-10-CM

## 2012-09-29 NOTE — Telephone Encounter (Signed)
What is the question? 

## 2012-09-30 ENCOUNTER — Telehealth: Payer: Self-pay | Admitting: Internal Medicine

## 2012-09-30 DIAGNOSIS — Z72821 Inadequate sleep hygiene: Secondary | ICD-10-CM

## 2012-09-30 NOTE — Telephone Encounter (Signed)
If your throat clearing gets worse I strongly advise you get off ace inhibitor (lisinopril) for at least a 4 week before the sleep study which you can schedule by calling [facility] at [phone] .  --  Dr. Melvyn Novas what type of sleep study do you want [place] to order for pt. thanks

## 2012-09-30 NOTE — Telephone Encounter (Signed)
Standard sleep study PSG at sleep center is fine

## 2012-09-30 NOTE — Telephone Encounter (Signed)
Order sent to [facility] and pt aware. Nothing further was needed

## 2012-09-30 NOTE — Telephone Encounter (Signed)
No need to waste money on sleep study until off acei  rec generic diovan 160 one daily x at least one month then standard sleep study can be done

## 2012-09-30 NOTE — Telephone Encounter (Signed)
[facility] calling about whether pt's lab test was put in wrong or if it was a mistake.  Order put in as 24 hr urine test, but urine had been dumped out from cup.  Please call [facility] back.

## 2012-09-30 NOTE — Telephone Encounter (Signed)
Called spoke with patient who stated that he was told that it can take up to 2-3 months for sleep study to be scheduled > would like this ordered now so that we "can be working on it" while he tries off the lisinopril.  [facility] generic diovan 115m [facility] WMelvyn Novasplease advise on your sleep study preference, thanks. Pt aware he will be contacted regarding sleep study.

## 2012-10-02 ENCOUNTER — Telehealth: Payer: Self-pay | Admitting: *Deleted

## 2012-10-02 NOTE — Telephone Encounter (Signed)
10/02/12 Spoke with  Chrys Racer at [facility] who stated that She spoke with Denise,LPN on 06/09/81  Already resolved. P.Jadea Shiffer,RN BSN [facility]

## 2012-10-02 NOTE — Telephone Encounter (Signed)
10/02/12 Spoke with patient stated he was told by Dr. Sloan Leiter that a referral would be placed for psychiatrist Because he is very anxious. Patient wanting to talk to doctor. P.Eliyana Pagliaro,RN BSN [facility]

## 2012-10-05 LAB — HEAVY METALS, RANDOM URINE

## 2012-10-06 ENCOUNTER — Encounter: Payer: Self-pay | Admitting: Internal Medicine

## 2012-10-07 ENCOUNTER — Telehealth: Payer: Self-pay | Admitting: *Deleted

## 2012-10-07 NOTE — Telephone Encounter (Signed)
10/07/12 Patient made aware that referral for psychiatrist has been placed. P.Mekhi Sonn,RN

## 2012-10-09 ENCOUNTER — Telehealth: Payer: Self-pay | Admitting: Internal Medicine

## 2012-10-13 ENCOUNTER — Telehealth: Payer: Self-pay | Admitting: *Deleted

## 2012-10-13 NOTE — Telephone Encounter (Signed)
10/13/12 Patient made aware of lab WNL  No elevated heavy metals.  P.Wiolliams,RN BSN MHA

## 2012-10-16 NOTE — Telephone Encounter (Signed)
Close  

## 2012-10-26 ENCOUNTER — Ambulatory Visit (HOSPITAL_BASED_OUTPATIENT_CLINIC_OR_DEPARTMENT_OTHER): Payer: No Typology Code available for payment source

## 2012-10-29 ENCOUNTER — Encounter: Payer: Self-pay | Admitting: Internal Medicine

## 2012-10-29 ENCOUNTER — Ambulatory Visit: Payer: No Typology Code available for payment source | Attending: Family Medicine | Admitting: Internal Medicine

## 2012-10-29 VITALS — BP 114/78 | HR 98 | Temp 98.7°F | Resp 16 | Ht 65.0 in | Wt 278.0 lb

## 2012-10-29 DIAGNOSIS — E669 Obesity, unspecified: Secondary | ICD-10-CM | POA: Insufficient documentation

## 2012-10-29 DIAGNOSIS — N62 Hypertrophy of breast: Secondary | ICD-10-CM | POA: Insufficient documentation

## 2012-10-29 DIAGNOSIS — I10 Essential (primary) hypertension: Secondary | ICD-10-CM | POA: Insufficient documentation

## 2012-10-29 DIAGNOSIS — F419 Anxiety disorder, unspecified: Secondary | ICD-10-CM

## 2012-10-29 DIAGNOSIS — Z09 Encounter for follow-up examination after completed treatment for conditions other than malignant neoplasm: Secondary | ICD-10-CM | POA: Insufficient documentation

## 2012-10-29 DIAGNOSIS — F411 Generalized anxiety disorder: Secondary | ICD-10-CM | POA: Insufficient documentation

## 2012-10-29 DIAGNOSIS — Z23 Encounter for immunization: Secondary | ICD-10-CM

## 2012-10-29 DIAGNOSIS — Z79899 Other long term (current) drug therapy: Secondary | ICD-10-CM | POA: Insufficient documentation

## 2012-10-29 DIAGNOSIS — K089 Disorder of teeth and supporting structures, unspecified: Secondary | ICD-10-CM | POA: Insufficient documentation

## 2012-10-29 DIAGNOSIS — R21 Rash and other nonspecific skin eruption: Secondary | ICD-10-CM | POA: Insufficient documentation

## 2012-10-29 DIAGNOSIS — F22 Delusional disorders: Secondary | ICD-10-CM | POA: Insufficient documentation

## 2012-10-29 DIAGNOSIS — K219 Gastro-esophageal reflux disease without esophagitis: Secondary | ICD-10-CM | POA: Insufficient documentation

## 2012-10-29 DIAGNOSIS — B1920 Unspecified viral hepatitis C without hepatic coma: Secondary | ICD-10-CM | POA: Insufficient documentation

## 2012-10-29 MED ORDER — TIZANIDINE HCL 4 MG PO CAPS: 4.0000 mg | ORAL_CAPSULE | Freq: Three times a day (TID) | ORAL | Status: DC

## 2012-10-29 NOTE — Progress Notes (Signed)
PT HERE FOR F/U TEST RESULTS AND TO DISCUSS [email or web address]

## 2012-10-29 NOTE — Progress Notes (Signed)
Patient ID: Christian Guerra, male   DOB: 1961-04-11, 52 y.o.   MRN: [id number] Patient Demographics  Christian Guerra, is a 52 y.o. male  [id number]  [id number]  DOB - Sep 11, 1960  Chief Complaint  Patient presents with  . Follow-up    lab results        Subjective:   Christian Guerra with History of gynecomastia, hepatitis C, GERD, hypertension, obesity, anxiety and delusions is here for result on his heavy metal screen tests which she requested last visit, he thinks some of his family members are poisoning him, his heavy metal urine screen is still pending after one month, he also complains of some delusions from time to time, not suicidal or homicidal, also complains of some toothache on and off and weight is chronic skin rashes and scabs which are bothering him. Denies any subjective complaints except as above, no active headache, no chest abdominal pain at this time, not short of breath. No focal weakness which is new.   Objective:    Patient Active Problem List   Diagnosis Date Noted  . Poor sleep hygiene 09/27/2012  . Chronic back pain 09/19/2012  . Hepatitis C 09/19/2012  . GERD (gastroesophageal reflux disease) 09/19/2012  . HTN (hypertension) 09/19/2012     Filed Vitals:   10/29/12 1129  BP: 114/78  Pulse: 98  Temp: 98.7 F (37.1 C)  TempSrc: Oral  Resp: 16  Height: 5' 5"  (1.651 m)  Weight: 278 lb (126.1 kg)  SpO2: 99%     Exam  Awake Alert, Oriented X 3, No new F.N deficits, anxious affect, not suicidal not homicidal [name].AT,PERRAL Supple Neck,No JVD, No cervical lymphadenopathy appriciated.  Symmetrical Chest wall movement, Good air movement bilaterally, CTAB RRR,No Gallops,Rubs or new Murmurs, No Parasternal Heave +ve B.Sounds, Abd Soft, Non tender, No organomegaly appriciated, No rebound - guarding or rigidity. No Cyanosis, Clubbing or edema, chronic old dry skin rashes and scabs, no signs of cellulitis    Data Review   CBC No results found for this  basename: WBC, HGB, HCT, PLT, MCV, MCH, MCHC, RDW, NEUTRABS, LYMPHSABS, MONOABS, EOSABS, BASOSABS, BANDABS, BANDSABD,  in the last 168 hours  Chemistries   No results found for this basename: NA, K, CL, CO2, GLUCOSE, BUN, CREATININE, GFRCGP, CALCIUM, MG, AST, ALT, ALKPHOS, BILITOT,  in the last 168 hours ------------------------------------------------------------------------------------------------------------------ No results found for this basename: HGBA1C,  in the last 72 hours ------------------------------------------------------------------------------------------------------------------ No results found for this basename: CHOL, HDL, LDLCALC, TRIG, CHOLHDL, LDLDIRECT,  in the last 72 hours ------------------------------------------------------------------------------------------------------------------ No results found for this basename: TSH, T4TOTAL, FREET3, T3FREE, THYROIDAB,  in the last 72 hours ------------------------------------------------------------------------------------------------------------------ No results found for this basename: VITAMINB12, FOLATE, FERRITIN, TIBC, IRON, RETICCTPCT,  in the last 72 hours  Coagulation profile  No results found for this basename: INR, PROTIME,  in the last 168 hours     Prior to Admission medications   Medication Sig Start Date End Date Taking? Authorizing Provider  Cholecalciferol (VITAMIN D-3 PO) Take 1 tablet by mouth daily.     Historical Provider, MD  gabapentin (NEURONTIN) 300 MG capsule Take 1 capsule (300 mg total) by mouth 3 (three) times daily. 09/19/12   Shanker Kristeen Mans, MD  HYDROcodone-acetaminophen (NORCO) 10-325 MG per tablet Take 1 tablet by mouth every 8 (eight) hours as needed. 11/07/11   Dot Lanes, MD  lisinopril (PRINIVIL,ZESTRIL) 10 MG tablet Take 1 tablet (10 mg total) by mouth daily. 09/19/12   Shanker Kristeen Mans, MD  omeprazole ([place])  20 MG capsule Take 20 mg by mouth daily.    Historical Provider, MD   tiZANidine (ZANAFLEX) 4 MG capsule Take 1 capsule (4 mg total) by mouth 3 (three) times daily. 10/29/12   Thurnell Lose, MD     Assessment & Plan   1. Patient thinks that he is getting heavy metal poisoning from one of his family members. He would not like to name the family member. His urine heavy metal screen ordered one month ago is still pending I question if lab has misplaced the specimen, requested the staff to contact lab, I'm ordering a heavy metal screen and blood today.  2. Anxiety and delusions. Patient has been referred to psychiatry.   3. Chronic dental pain. Dentistry referral made.   4. Multiple scabs on the skin. None look infected, referred to dermatology.   Routine health maintenance. Baseline labs from last time are stable, he has been referred to GI for colonoscopy, he will get a tetanus shot.      Thurnell Lose M.D on 10/29/2012 at 11:41 AM

## 2012-10-31 ENCOUNTER — Telehealth: Payer: Self-pay

## 2012-10-31 LAB — HEAVY METALS, BLOOD: Arsenic: <3 mcg/L (ref <23)

## 2012-10-31 NOTE — Telephone Encounter (Signed)
Left message on machine.

## 2012-10-31 NOTE — Progress Notes (Signed)
Quick Note:  Left patient now has heavy metal screen is negative ______

## 2012-10-31 NOTE — Telephone Encounter (Signed)
Message copied by Dorothe Pea on Fri Oct 31, 2012  4:55 PM ------      Message from: [facility], [place]      Created: Fri Oct 31, 2012  4:34 PM       Left patient now has heavy metal screen is negative ------

## 2012-11-04 ENCOUNTER — Encounter: Payer: Self-pay | Admitting: Internal Medicine

## 2012-11-18 ENCOUNTER — Ambulatory Visit (HOSPITAL_BASED_OUTPATIENT_CLINIC_OR_DEPARTMENT_OTHER): Payer: No Typology Code available for payment source | Attending: Internal Medicine | Admitting: Radiology

## 2012-11-18 VITALS — Ht 66.0 in | Wt 274.0 lb

## 2012-11-18 DIAGNOSIS — Z72821 Inadequate sleep hygiene: Secondary | ICD-10-CM

## 2012-11-18 DIAGNOSIS — G4733 Obstructive sleep apnea (adult) (pediatric): Secondary | ICD-10-CM | POA: Insufficient documentation

## 2012-11-19 ENCOUNTER — Ambulatory Visit: Payer: No Typology Code available for payment source | Attending: Family Medicine

## 2012-11-19 DIAGNOSIS — A6920 Lyme disease, unspecified: Secondary | ICD-10-CM

## 2012-11-19 LAB — RENAL FUNCTION PANEL
Albumin: 4.3 g/dL (ref 3.5–5.2)
Calcium: 9.4 mg/dL (ref 8.4–10.5)
Phosphorus: 3.1 mg/dL (ref 2.3–4.6)
Potassium: 3.7 mEq/L (ref 3.5–5.3)
Sodium: 139 mEq/L (ref 135–145)

## 2012-11-20 LAB — VITAMIN D 25 HYDROXY (VIT D DEFICIENCY, FRACTURES): Vit D, 25-Hydroxy: 42 ng/mL (ref 30–89)

## 2012-11-21 LAB — LYME ABY, WSTRN BLT IGG & IGM W/BANDS
B burgdorferi IgG Abs (IB): NEGATIVE
Lyme Disease 28 kD IgG: NONREACTIVE
Lyme Disease 30 kD IgG: NONREACTIVE
Lyme Disease 39 kD IgG: NONREACTIVE
Lyme Disease 39 kD IgM: NONREACTIVE
Lyme Disease 45 kD IgG: NONREACTIVE
Lyme Disease 93 kD IgG: NONREACTIVE

## 2012-11-24 LAB — ARTHRITIS PANEL

## 2012-11-25 ENCOUNTER — Telehealth: Payer: Self-pay | Admitting: *Deleted

## 2012-11-25 NOTE — Progress Notes (Signed)
Quick Note:  Please inform patient that tests came back negative except that uric acid was elevated.   Gerlene Fee, MD, CDE, [place], [place]   ______

## 2012-11-25 NOTE — Telephone Encounter (Signed)
11/25/12 Patient made aware of test results came back negative except that uric acid was elevated. P.Chin Wachter,RN BSN [facility]

## 2012-12-01 DIAGNOSIS — G4733 Obstructive sleep apnea (adult) (pediatric): Secondary | ICD-10-CM

## 2012-12-01 NOTE — Procedures (Signed)
NAME:  Christian Guerra, Christian Guerra NO.:  [id number]  MEDICAL RECORD NO.:  [id number]          PATIENT TYPE:  OUT  LOCATION:  SLEEP CENTER                 FACILITY:  [facility]  PHYSICIAN:  Kathee Delton, MD,FCCPDATE OF BIRTH:  11-Oct[birth year]1960  DATE OF STUDY:  11/18/2012                           NOCTURNAL POLYSOMNOGRAM  REFERRING PHYSICIAN:  Christena Deem. Wert, MD, FCCP  INDICATION FOR STUDY:  Hypersomnia with sleep apnea.  EPWORTH SLEEPINESS SCORE:  6.  MEDICATIONS:  SLEEP ARCHITECTURE:  The patient had a total sleep time of only 77 minutes with no slow-wave sleep or REM.  Sleep onset latency was prolonged at 63 minutes and sleep efficiency was very poor at 22%.  RESPIRATORY DATA:  The patient was found to have 42 apneas and 18 obstructive hypopneas during his total sleep time, giving him an apnea- hypopnea index of 47 events per hour.  The events occurred primarily in the supine position, and there was moderate snoring noted throughout.  OXYGEN DATA:  The patient had O2 desaturation as low as 79%, with his obstructive events.  CARDIAC DATA:  No clinically significant arrhythmias were seen.  MOVEMENT-PARASOMNIA:  The patient had no significant leg jerks or other abnormal behaviors noted.  IMPRESSIONS-RECOMMENDATIONS: 1. Severe obstructive sleep apnea/hypopnea syndrome, with an apnea-     hypopnea index of 47 events per hour and oxygen desaturation as low     as 79%.  Treatment for this degree of sleep apnea should focus     primarily on weight loss as well as a trial of continuous positive     airway pressure.     Kathee Delton, MD,FCCP Diplomate, [place] Board of Sleep Medicine    [facility]/MEDQ  D:  12/01/2012 08:21:58  T:  12/01/2012 08:57:46  Job:  [id number]

## 2012-12-02 ENCOUNTER — Other Ambulatory Visit: Payer: Self-pay | Admitting: Internal Medicine

## 2012-12-02 ENCOUNTER — Encounter: Payer: Self-pay | Admitting: Internal Medicine

## 2012-12-02 DIAGNOSIS — G4733 Obstructive sleep apnea (adult) (pediatric): Secondary | ICD-10-CM

## 2012-12-04 ENCOUNTER — Emergency Department (HOSPITAL_BASED_OUTPATIENT_CLINIC_OR_DEPARTMENT_OTHER): Payer: No Typology Code available for payment source

## 2012-12-04 ENCOUNTER — Encounter (HOSPITAL_BASED_OUTPATIENT_CLINIC_OR_DEPARTMENT_OTHER): Payer: Self-pay | Admitting: Emergency Medicine

## 2012-12-04 ENCOUNTER — Emergency Department (HOSPITAL_BASED_OUTPATIENT_CLINIC_OR_DEPARTMENT_OTHER)
Admission: EM | Admit: 2012-12-04 | Discharge: 2012-12-04 | Disposition: A | Discharge status: Home / Self Care | Payer: No Typology Code available for payment source | Attending: Emergency Medicine | Admitting: Emergency Medicine

## 2012-12-04 DIAGNOSIS — G8929 Other chronic pain: Secondary | ICD-10-CM | POA: Insufficient documentation

## 2012-12-04 DIAGNOSIS — R109 Unspecified abdominal pain: Secondary | ICD-10-CM

## 2012-12-04 DIAGNOSIS — R1011 Right upper quadrant pain: Secondary | ICD-10-CM | POA: Insufficient documentation

## 2012-12-04 DIAGNOSIS — Z8619 Personal history of other infectious and parasitic diseases: Secondary | ICD-10-CM | POA: Insufficient documentation

## 2012-12-04 DIAGNOSIS — M25519 Pain in unspecified shoulder: Secondary | ICD-10-CM | POA: Insufficient documentation

## 2012-12-04 DIAGNOSIS — Z9089 Acquired absence of other organs: Secondary | ICD-10-CM | POA: Insufficient documentation

## 2012-12-04 DIAGNOSIS — K219 Gastro-esophageal reflux disease without esophagitis: Secondary | ICD-10-CM | POA: Insufficient documentation

## 2012-12-04 DIAGNOSIS — I10 Essential (primary) hypertension: Secondary | ICD-10-CM | POA: Insufficient documentation

## 2012-12-04 DIAGNOSIS — Z79899 Other long term (current) drug therapy: Secondary | ICD-10-CM | POA: Insufficient documentation

## 2012-12-04 HISTORY — DX: Essential (primary) hypertension: I10

## 2012-12-04 LAB — COMPREHENSIVE METABOLIC PANEL
ALT: 23 U/L (ref 0–53)
AST: 16 U/L (ref 0–37)
Albumin: 3.5 g/dL (ref 3.5–5.2)
Calcium: 9.7 mg/dL (ref 8.4–10.5)
GFR calc Af Amer: >90 mL/min (ref >90)
Glucose, Bld: 135 mg/dL — ABNORMAL HIGH (ref 70–99)
Sodium: 136 mEq/L (ref 135–145)
Total Protein: 7.7 g/dL (ref 6.0–8.3)

## 2012-12-04 LAB — CBC WITH DIFFERENTIAL/PLATELET
Basophils Absolute: 0 10*3/uL (ref 0.0–0.1)
Basophils Relative: 0 % (ref 0–1)
Eosinophils Absolute: 0.3 10*3/uL (ref 0.0–0.7)
Eosinophils Relative: 3 % (ref 0–5)
MCH: 27.1 pg (ref 26.0–34.0)
MCV: 82.8 fL (ref 78.0–100.0)
Platelets: 301 10*3/uL (ref 150–400)
RDW: 14.8 % (ref 11.5–15.5)

## 2012-12-04 LAB — URINALYSIS, ROUTINE W REFLEX MICROSCOPIC
Hgb urine dipstick: NEGATIVE
Specific Gravity, Urine: 1.005 (ref 1.005–1.030)
Urobilinogen, UA: 0.2 mg/dL (ref 0.0–1.0)

## 2012-12-04 MED ORDER — OXYCODONE-ACETAMINOPHEN 5-325 MG PO TABS: 1.0000 | ORAL_TABLET | ORAL | Status: DC | PRN

## 2012-12-04 MED ORDER — MORPHINE SULFATE 4 MG/ML IJ SOLN
4.0000 mg | Freq: Once | INTRAMUSCULAR | Status: AC
  Administered 2012-12-04: 4 mg via INTRAVENOUS
  Filled 2012-12-04: qty 1

## 2012-12-04 MED ORDER — ONDANSETRON HCL 4 MG/2ML IJ SOLN
4.0000 mg | Freq: Once | INTRAMUSCULAR | Status: AC
  Administered 2012-12-04: 4 mg via INTRAVENOUS
  Filled 2012-12-04: qty 2

## 2012-12-04 NOTE — ED Notes (Signed)
Pt in with c/o RUQ pain and right shoulder pain x 5 days. Denies V D CP LOC SOB. Pain gets worse after eating.

## 2012-12-04 NOTE — ED Provider Notes (Signed)
CSN: [id number]     Arrival date & time 12/04/12  1622 History     First MD Initiated Contact with Patient 12/04/12 1640     Chief Complaint  Patient presents with  . Abdominal Pain    RUQ x 5 days  . Shoulder Pain    right   (Consider location/radiation/quality/duration/timing/severity/associated sxs/prior Treatment) HPI Comments: Pt is c/o ruq pain that started 5 days upon waking:pt states that he is also having shoulder pain without radiation, states that he needs to have surgery to the area:pt states that he has not had n/v/d, fever, cough, cp, or KDT:OIZT is constant:worse with eating:no history of similar symptoms:pt states that he is a drinker 40 oz every other day  Patient is a 52 y.o. male presenting with shoulder pain. The history is provided by the patient. A language interpreter was used.  Shoulder Pain    Past Medical History  Diagnosis Date  . Hepatitis C   . GERD (gastroesophageal reflux disease)   . Back pain   . Hypertension    Past Surgical History  Procedure Laterality Date  . Appendectomy    . Ankle surgery    . Testicle torsion reduction    . Arm surgery     History reviewed. No pertinent family history. History  Substance Use Topics  . Smoking status: Never Smoker   . Smokeless tobacco: Current User    Types: Snuff  . Alcohol Use: Yes     Comment: 1-2 beers every other day    Review of Systems  Constitutional: Negative.   Respiratory: Negative.   Cardiovascular: Negative.     Allergies  Codeine  Home Medications   Current Outpatient Rx  Name  Route  Sig  Dispense  Refill  . Cholecalciferol (VITAMIN D-3 PO)   Oral   Take 1 tablet by mouth daily.          Marland Kitchen gabapentin (NEURONTIN) 300 MG capsule   Oral   Take 1 capsule (300 mg total) by mouth 3 (three) times daily.   90 capsule   3   . HYDROcodone-acetaminophen (NORCO) 10-325 MG per tablet   Oral   Take 1 tablet by mouth every 8 (eight) hours as needed.   30 tablet   0   .  lisinopril (PRINIVIL,ZESTRIL) 10 MG tablet   Oral   Take 1 tablet (10 mg total) by mouth daily.   30 tablet   3   . omeprazole (PRILOSEC) 20 MG capsule   Oral   Take 20 mg by mouth daily.         Marland Kitchen tiZANidine (ZANAFLEX) 4 MG capsule   Oral   Take 1 capsule (4 mg total) by mouth 3 (three) times daily.   90 capsule   3    BP 151/92  Pulse 106  Temp(Src) 98.2 F (36.8 C) (Oral)  Resp 22  Ht 5' 6"  (1.676 m)  Wt 275 lb (124.739 kg)  BMI 44.41 kg/m2  SpO2 100% Physical Exam  Nursing note and vitals reviewed. Constitutional: He is oriented to person, place, and time. He appears well-developed and well-nourished.  HENT:  Head: Normocephalic and atraumatic.  Eyes: Conjunctivae and EOM are normal.  Neck: Normal range of motion. Neck supple.  Cardiovascular: Normal rate and regular rhythm.   Pulmonary/Chest: Effort normal and breath sounds normal.  Abdominal: Soft. Bowel sounds are normal. There is tenderness in the right upper quadrant.  Musculoskeletal: Normal range of motion.  Generalized tenderness with palpation  to the right shoulder:pt has full rom  Neurological: He is alert and oriented to person, place, and time.  Skin: Skin is warm and dry.  Psychiatric: He has a normal mood and affect.    ED Course   Procedures (including critical care time)  Labs Reviewed  CBC WITH DIFFERENTIAL - Abnormal; Notable for the following:    Hemoglobin 12.8 (*)    Neutrophils Relative % 78 (*)    Neutro Abs 8.2 (*)    All other components within normal limits  COMPREHENSIVE METABOLIC PANEL - Abnormal; Notable for the following:    Glucose, Bld 135 (*)    All other components within normal limits  LIPASE, BLOOD  URINALYSIS, ROUTINE W REFLEX MICROSCOPIC   US Abdomen Complete  12/04/2012   *RADIOLOGY REPORT*  Clinical Data:  Right upper quadrant pain  COMPLETE ABDOMINAL ULTRASOUND  Comparison:  None.  Findings: The examination is significantly limited by overlying bowel gas  related to a postprandial state.  Gallbladder:  Partially distended.  No gallstones are seen.  Common bile duct:  3.8 mm.  Liver:  Echogenic consistent with fatty infiltration.  IVC:  Appears normal.  Pancreas:  No focal abnormality seen.  Spleen:  7.6 cm.  Right Kidney:  10 cm.  No mass lesion or hydronephrosis is noted.  Left Kidney:  10.6 cm.  No mass lesion or hydronephrosis is noted.  Abdominal aorta:  Not well visualized due to the overlying bowel gas.  IMPRESSION: Somewhat limited exam although no acute abnormality is seen.  Fatty infiltration of the liver is noted.                    Original Report Authenticated By: Inez Catalina, M.D.   1. Abdominal pain     MDM  Pt is pain free at this time:no acute finding noted on ultrasound:pt is okay to go home with pain medication and follow up with pcp or here for continued symptoms:pt was given return instructions  Glendell Docker, NP 12/04/12 1813

## 2012-12-04 NOTE — ED Notes (Signed)
Pt. Able to speak clear speech and sentences with no diff. Breathing.  Pt. Reports ability to eat and no nausea vomiting or diarrhea.  No cough and no injury recently per Pt.  Reports pain in the R shoulder as well.  Pt. Has chronic pain in the R shoulder.

## 2012-12-07 NOTE — ED Provider Notes (Signed)
Medical screening examination/treatment/procedure(s) were performed by non-physician practitioner and as supervising physician I was immediately available for consultation/collaboration.   Houston Siren, MD 12/07/12 2121

## 2012-12-09 ENCOUNTER — Encounter: Payer: Self-pay | Admitting: Internal Medicine

## 2013-01-01 ENCOUNTER — Ambulatory Visit (AMBULATORY_SURGERY_CENTER): Payer: Self-pay | Admitting: *Deleted

## 2013-01-01 VITALS — Ht 66.0 in | Wt 272.2 lb

## 2013-01-01 DIAGNOSIS — Z1211 Encounter for screening for malignant neoplasm of colon: Secondary | ICD-10-CM

## 2013-01-01 NOTE — Progress Notes (Signed)
No allergies to eggs or soy. No problems with anesthesia.  Pt has "orange card" and cannot afford prep.  Pt given sample of MoviPrep

## 2013-01-07 ENCOUNTER — Institutional Professional Consult (permissible substitution): Payer: No Typology Code available for payment source | Admitting: Pulmonary Disease

## 2013-01-15 ENCOUNTER — Ambulatory Visit (AMBULATORY_SURGERY_CENTER): Payer: No Typology Code available for payment source | Admitting: Internal Medicine

## 2013-01-15 ENCOUNTER — Encounter: Payer: Self-pay | Admitting: Internal Medicine

## 2013-01-15 VITALS — BP 134/76 | HR 89 | Temp 97.8°F | Resp 19 | Ht 66.0 in | Wt 272.0 lb

## 2013-01-15 DIAGNOSIS — Z1211 Encounter for screening for malignant neoplasm of colon: Secondary | ICD-10-CM

## 2013-01-15 DIAGNOSIS — D126 Benign neoplasm of colon, unspecified: Secondary | ICD-10-CM

## 2013-01-15 MED ORDER — SODIUM CHLORIDE 0.9 % IV SOLN: 500.0000 mL | INTRAVENOUS | Status: DC

## 2013-01-15 NOTE — Patient Instructions (Addendum)
I found and removed 6 polyps that all look benign. It will probably mean that I recommend you have a colonoscopy in 3-5 years.  I will let you know pathology results and when to have another routine colonoscopy by mail.  I appreciate the opportunity to care for you. Gatha Mayer, MD, FACG  YOU HAD AN ENDOSCOPIC PROCEDURE TODAY AT [facility]: Refer to the procedure report that was given to you for any specific questions about what was found during the examination.  If the procedure report does not answer your questions, please call your gastroenterologist to clarify.  If you requested that your care partner not be given the details of your procedure findings, then the procedure report has been included in a sealed envelope for you to review at your convenience later.  YOU SHOULD EXPECT: Some feelings of bloating in the abdomen. Passage of more gas than usual.  Walking can help get rid of the air that was put into your GI tract during the procedure and reduce the bloating. If you had a lower endoscopy (such as a colonoscopy or flexible sigmoidoscopy) you may notice spotting of blood in your stool or on the toilet paper. If you underwent a bowel prep for your procedure, then you may not have a normal bowel movement for a few days.  DIET: Your first meal following the procedure should be a light meal and then it is ok to progress to your normal diet.  A half-sandwich or bowl of soup is an example of a good first meal.  Heavy or fried foods are harder to digest and may make you feel nauseous or bloated.  Likewise meals heavy in dairy and vegetables can cause extra gas to form and this can also increase the bloating.  Drink plenty of fluids but you should avoid alcoholic beverages for 24 hours.  ACTIVITY: Your care partner should take you home directly after the procedure.  You should plan to take it easy, moving slowly for the rest of the day.  You can resume normal activity the day  after the procedure however you should NOT DRIVE or use heavy machinery for 24 hours (because of the sedation medicines used during the test).    SYMPTOMS TO REPORT IMMEDIATELY: A gastroenterologist can be reached at any hour.  During normal business hours, 8:30 AM to 5:00 PM Monday through Friday, call [phone].  After hours and on weekends, please call the GI answering service at [phone] who will take a message and have the physician on call contact you.   Following lower endoscopy (colonoscopy or flexible sigmoidoscopy):  Excessive amounts of blood in the stool  Significant tenderness or worsening of abdominal pains  Swelling of the abdomen that is new, acute  Fever of 100F or higher  FOLLOW UP: If any biopsies were taken you will be contacted by phone or by letter within the next 1-3 weeks.  Call your gastroenterologist if you have not heard about the biopsies in 3 weeks.  Our staff will call the home number listed on your records the next business day following your procedure to check on you and address any questions or concerns that you may have at that time regarding the information given to you following your procedure. This is a courtesy call and so if there is no answer at the home number and we have not heard from you through the emergency physician on call, we will assume that you have returned to  your regular daily activities without incident.  SIGNATURES/CONFIDENTIALITY: You and/or your care partner have signed paperwork which will be entered into your electronic medical record.  These signatures attest to the fact that that the information above on your After Visit Summary has been reviewed and is understood.  Full responsibility of the confidentiality of this discharge information lies with you and/or your care-partner.  Polyp-handout given  Repeat colonoscopy will be determined by pathology.

## 2013-01-15 NOTE — Progress Notes (Signed)
Pt. Stated that he broke out with rash 4 days ago and that he was going to the doctor when he leaves from here to get something for the rash.

## 2013-01-15 NOTE — Progress Notes (Signed)
Called to room to assist during endoscopic procedure.  Patient ID and intended procedure confirmed with present staff. Received instructions for my participation in the procedure from the performing physician.  

## 2013-01-15 NOTE — Op Note (Signed)
[facility]  [address] [place], [id number]   COLONOSCOPY PROCEDURE REPORT  PATIENT: Christian Guerra, Christian Guerra  MR#: [id number] BIRTHDATE: Sep 25, 1960 , 52  yrs. old GENDER: Male ENDOSCOPIST: Gatha Mayer, MD, Marval Regal REFERRED BY:   Chauncy Lean, M.D. PROCEDURE DATE:  01/15/2013 PROCEDURE:   Colonoscopy with biopsy and snare polypectomy First Screening Colonoscopy - Avg.  risk and is 50 yrs.  old or older Yes.  Prior Negative Screening - Now for repeat screening. N/A  History of Adenoma - Now for follow-up colonoscopy & has been > or = to 3 yrs.  N/A  Polyps Removed Today? Yes. ASA CLASS:   Class III INDICATIONS:average risk screening and first colonoscopy. MEDICATIONS: propofol (Diprivan) 388m IV, MAC sedation, administered by CRNA, and These medications were titrated to patient response per physician's verbal order  DESCRIPTION OF PROCEDURE:   After the risks benefits and alternatives of the procedure were thoroughly explained, informed consent was obtained.  A digital rectal exam revealed no abnormalities of the rectum, A digital rectal exam revealed no prostatic nodules, and A digital rectal exam revealed the prostate was not enlarged.   The LB [id number] endoscope was introduced through the anus and advanced to the cecum, which was identified by both the appendix and ileocecal valve. No adverse events experienced.   The quality of the prep was Suprep good  The instrument was then slowly withdrawn as the colon was fully examined.   COLON FINDINGS: Six sessile and semi-pedunculated  polyps measuring 2-8 mm in size were found.  5 in cecum and one in transverse colon (distal). A polypectomy was performed with cold forceps (1 cecal polyp) and with a cold snare. (other polyps)  The resection was complete and the polyp tissue was completely retrieved.   The colon mucosa was otherwise normal.   A right colon retroflexion was performed.  Retroflexed views revealed no  abnormalities. The time to cecum=2 minutes 15 seconds.  Withdrawal time=13 minutes 50 seconds.  The scope was withdrawn and the procedure completed. COMPLICATIONS: There were no complications.  ENDOSCOPIC IMPRESSION: 1.   Six sessile polyps measuring 2-8 mm in size were found; polypectomy was performed with cold forceps and with a cold snare 2.   The colon mucosa was otherwise normal - good prep  RECOMMENDATIONS: Timing of repeat colonoscopy will be determined by pathology findings.   eSigned:  CGatha Mayer MD, FHill Country Memorial Hospital09/25/2014 12:20 PM   cc: The Patient  and PChauncy Lean MD   PATIENT NAME:  Christian Guerra, SimonesMR#: [id number]

## 2013-01-16 ENCOUNTER — Telehealth: Payer: Self-pay | Admitting: *Deleted

## 2013-01-16 NOTE — Telephone Encounter (Signed)
  Follow up Call-  Call back number 01/15/2013  Post procedure Call Back phone  # [phone]  Permission to leave phone message Yes     Patient questions:  Do you have a fever, pain , or abdominal swelling? no Pain Score  0 *  Have you tolerated food without any problems? yes  Have you been able to return to your normal activities? yes  Do you have any questions about your discharge instructions: Diet   no Medications  no Follow up visit  no  Do you have questions or concerns about your Care? no  Actions: * If pain score is 4 or above: No action needed, pain <4.

## 2013-01-20 ENCOUNTER — Encounter: Payer: Self-pay | Admitting: Internal Medicine

## 2013-01-20 DIAGNOSIS — Z860100 Personal history of colon polyps, unspecified: Secondary | ICD-10-CM

## 2013-01-20 HISTORY — DX: Personal history of colon polyps, unspecified: Z86.0100

## 2013-01-20 HISTORY — DX: Personal history of colonic polyps: Z86.010

## 2013-01-20 NOTE — Progress Notes (Signed)
Quick Note:  5 inflammatory cecal polyps and one transverse adenoma (diminutive) Repeat colon 2019 ______

## 2013-01-26 ENCOUNTER — Ambulatory Visit: Payer: No Typology Code available for payment source | Attending: Internal Medicine | Admitting: Internal Medicine

## 2013-01-26 ENCOUNTER — Encounter: Payer: Self-pay | Admitting: Internal Medicine

## 2013-01-26 VITALS — BP 156/86 | HR 119 | Temp 98.5°F | Resp 16 | Ht 66.0 in | Wt 272.0 lb

## 2013-01-26 DIAGNOSIS — L708 Other acne: Secondary | ICD-10-CM

## 2013-01-26 DIAGNOSIS — R21 Rash and other nonspecific skin eruption: Secondary | ICD-10-CM | POA: Insufficient documentation

## 2013-01-26 DIAGNOSIS — Z23 Encounter for immunization: Secondary | ICD-10-CM

## 2013-01-26 DIAGNOSIS — L709 Acne, unspecified: Secondary | ICD-10-CM

## 2013-01-26 MED ORDER — TRETINOIN 0.025 % EX CREA: TOPICAL_CREAM | Freq: Every day | CUTANEOUS | Status: DC

## 2013-01-26 MED ORDER — DOXYCYCLINE HYCLATE 100 MG PO TABS: 100.0000 mg | ORAL_TABLET | Freq: Two times a day (BID) | ORAL | Status: DC

## 2013-01-26 NOTE — Progress Notes (Signed)
Patient ID: Christian Guerra, male   DOB: August 15, 1960, 52 y.o.   MRN: [id number]    HPI: This is a 52 year old male who presents for his rash. It is present on his face and upper back. He states that he's had it for a year he takes a course of doxycycline it cleared up for the duration of the course but then always recurs. He states that he had a boil sulfate that it had to be lanced recently. 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 The patient states that he had acne when he was a teenager and was also prescribed Doxy at that time.  He also complains about generalized muscle aches which she notes have improved on the doxycycline that he is currently taking.  Allergies  Allergen Reactions  . Codeine Itching   Past Medical History  Diagnosis Date  . Hepatitis C   . GERD (gastroesophageal reflux disease)   . Back pain   . Hypertension   . Sleep apnea   . Personal history of colonic adenoma 01/20/2013   Prior to Admission medications   Medication Sig Start Date End Date Taking? Authorizing Provider  Cholecalciferol (VITAMIN D-3 PO) Take 1 tablet by mouth daily.    Yes Historical Provider, MD  Cyanocobalamin (VITAMIN B 12 PO) Take by mouth daily.   Yes Historical Provider, MD  cyclobenzaprine (FLEXERIL) 10 MG tablet Take 10 mg by mouth 3 (three) times daily as needed for muscle spasms.   Yes Historical Provider, MD  gabapentin (NEURONTIN) 300 MG capsule Take 1 capsule (300 mg total) by mouth 3 (three) times daily. 09/19/12  Yes Shanker Kristeen Mans, MD  HYDROcodone-acetaminophen (NORCO) 10-325 MG per tablet Take 1 tablet by mouth every 8 (eight) hours as needed. 11/07/11  Yes Dot Lanes, MD  IRON PO Take by mouth daily.   Yes Historical Provider, MD  lisinopril (PRINIVIL,ZESTRIL) 10 MG tablet Take 1 tablet (10 mg total) by mouth daily. 09/19/12  Yes Shanker Kristeen Mans, MD  omeprazole (PRILOSEC) 20 MG capsule Take 20 mg by mouth daily.   Yes Historical Provider, MD   Lorcaserin HCl (BELVIQ PO) Take by mouth 2 (two) times daily.    Historical Provider, MD   Apply topically at bedtime. 01/26/13   Debbe Odea, MD   Family History  Problem Relation Age of Onset  . Colon cancer Neg Hx    History   Social History  . Marital Status: Single    Spouse Name: N/A    Number of Children: 0  . Years of Education: N/A   Occupational History  . 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 Smoking status: Never Smoker   . Smokeless tobacco: Current User    Types: Snuff  . Alcohol Use: 0.0 oz/week     Comment: drinks 40 oz beer every other day/ stopped 12/15/12  . Drug Use: No  . Sexual Activity: Not on file   Other Topics Concern  . Not on file   Social History Narrative  . No narrative on file    Review of Systems ______ Constitutional: Negative for fever, chills, diaphoresis, activity change, appetite change and fatigue. ____ HENT: Negative for ear pain, nosebleeds, congestion, facial swelling, rhinorrhea, neck pain, neck stiffness and ear discharge.  ____ Eyes: Negative for pain, discharge, redness, itching and visual disturbance. ____ Respiratory: Negative for cough, choking, chest tightness, shortness of breath, wheezing and stridor.  ____ Cardiovascular:  Negative for chest pain, palpitations and leg swelling. ____ Gastrointestinal: Negative for Nausea/ Vomiting/ Diarrhea or Consitpation Genitourinary: Negative for dysuria, urgency, frequency, hematuria, flank pain, decreased urine volume, difficulty urinating and dyspareunia. ____ Musculoskeletal: Negative for back pain, joint swelling, arthralgias and gait problem. ________ Neurological: Negative for dizziness, tremors, seizures, syncope, facial asymmetry, speech difficulty, weakness, light-headedness, numbness and headaches. ____ Hematological: Negative for adenopathy. Does not bruise/bleed easily. ____ Psychiatric/Behavioral: Negative for hallucinations, behavioral problems, confusion,  dysphoric mood, decreased concentration and agitation. ______   Objective:   Filed Vitals:   01/26/13 0937  BP: 156/86  Pulse: 119  Temp: 98.5 F (36.9 C)  Resp: 16    Physical Exam ______ Constitutional: Appears well-developed and well-nourished. No distress. ____ HENT: Normocephalic. External right and left ear normal. Oropharynx is clear and moist. ____ Eyes: Conjunctivae and EOM are normal. PERRLA, no scleral icterus. ____ Neck: Normal ROM. Neck supple. No JVD. No tracheal deviation. No thyromegaly. ____ CVS: RRR, S1/S2 +, no murmurs, no gallops, no carotid bruit.  Pulmonary: Effort and breath sounds normal, no stridor, rhonchi, wheezes, rales.  Abdominal: Soft. BS +,  no distension, tenderness, rebound or guarding. ________ Musculoskeletal: Normal range of motion. No edema and no tenderness. ____ Lymphadenopathy: No lymphadenopathy noted, cervical, inguinal. Neuro: Alert. Normal reflexes, muscle tone coordination. No cranial nerve deficit. Skin: Skin is warm and dry. Small pustules and scabs consistent with acne noted on face and upper back. Significant scarring noted as well.   Psychiatric: Normal mood and affect. Behavior, judgment, thought content normal. __  Lab Results  Component Value Date   WBC 10.5 12/04/2012   HGB 12.8* 12/04/2012   HCT 39.1 12/04/2012   MCV 82.8 12/04/2012   PLT 301 12/04/2012   Lab Results  Component Value Date   CREATININE 0.90 12/04/2012   BUN 10 12/04/2012   NA 136 12/04/2012   K 3.6 12/04/2012   CL 97 12/04/2012   CO2 28 12/04/2012    Lab Results  Component Value Date   HGBA1C 5.4 09/23/2012   Lipid Panel     Component Value Date/Time   CHOL 152 09/23/2012 0910   TRIG 117 09/23/2012 0910   HDL 38* 09/23/2012 0910   CHOLHDL 4.0 09/23/2012 0910   VLDL 23 09/23/2012 0910   LDLCALC 91 09/23/2012 0910       Assessment and plan:   Patient Active Problem List   Diagnosis Date Noted  . Acne 01/26/2013  . Personal history of colonic adenoma  01/20/2013  . OSA (obstructive sleep apnea) 09/27/2012  . Chronic back pain 09/19/2012  . Hepatitis C 09/19/2012  . GERD (gastroesophageal reflux disease) 09/19/2012  . HTN (hypertension) 09/19/2012    Adult acne:  tretinoin (RETIN-A) 0.025 % cream-I've given him a coupon from [employer] which will allow him to obtain it for $42. He is advised to apply on face and upper back at bedtime. He is advised to use a sunblock for sensitive skin during the day.  He is advised to continue doxycycline while initiating the treatment with the Retin-A as it can exacerbate acne initially. He has 4 days left from his current course. I've advised him to take another 10 days and given him a prescription.  He is also advised to use salicylic acid wash to cleanse these areas.  He may return on October 29 when I will be present in the clinic.

## 2013-01-26 NOTE — Patient Instructions (Signed)
You have adult acne. The treatment is similar to adolescent acne.         Use a sun block that is for sensitive skin while using Retin-A   Salicylic Acid wash What is this medicine? SALICYCLIC ACID (SAL i SIL ik AS id) is a skin cleanser. It is used to treat and prevent acne. This medicine may be used for other purposes; ask your health care provider or pharmacist if you have questions. What should I tell my health care provider before I take this medicine? They need to know if you have any of these conditions: -kidney disease -liver disease -an unusual or allergic reaction to salicylic acid, other medicines, foods, dyes, or preservatives -pregnant or trying to get pregnant -breast-feeding How should I use this medicine? This medicine is for external use only. Do not take by mouth. Follow the directions on the label. Do not apply to raw or irritated skin. Avoid getting medicine in your eyes, lips, nose, mouth, or other sensitive areas. Use this medicine at regular intervals. Do not use more often than directed. Talk to your pediatrician regarding the use of this medicine in children. Special care may be needed. Overdosage: If you think you have taken too much of this medicine contact a poison control center or emergency room at once.  What should I watch for while using this medicine? Tell your doctor or healthcare professional if your symptoms do not start to get better or if they get worse. This medicine can make you more sensitive to the sun. Keep out of the sun. If you cannot avoid being in the sun, wear protective clothing and use sunscreen. Do not use sun lamps or tanning beds/booths.  What side effects may I notice from receiving this medicine? Side effects that you should report to your doctor or health care professional as soon as possible: -allergic reactions like skin rash, itching or hives, swelling of the face, lips, or tongue Side effects that usually do not require medical  attention (report to your doctor or health care professional if they continue or are bothersome): -skin irritation This list may not describe all possible side effects. Call your doctor for medical advice about side effects. You may report side effects to FDA at 1-800-FDA-1088. Where should I keep my medicine? Keep out of the reach of children. Store at room temperature between 15 and 30 degrees C (59 and 86 degrees F). Keep container tightly closed. Throw away any unused medicine after the expiration date. NOTE: This sheet is a summary. It may not cover all possible information. If you have questions about this medicine, talk to your doctor, pharmacist, or health care provider.  2012, [employer]vier/Gold Standard. (12/12/2007 1:37:44 PM)      Acne Acne is a skin problem that causes pimples. Acne occurs when the pores in your skin get blocked. Your pores may become red, sore, and swollen (inflamed), or infected with a common skin bacterium (Propionibacterium acnes). Acne is a common skin problem. Up to 80% of people get acne at some time. Acne is especially common from the ages of 31 to 16. Acne usually goes away over time with proper treatment. CAUSES  Your pores each contain an oil gland. The oil glands make an oily substance called sebum. Acne happens when these glands get plugged with sebum, dead skin cells, and dirt. The P. acnes bacteria that are normally found in the oil glands then multiply, causing inflammation. Acne is commonly triggered by changes in your hormones.  These hormonal changes can cause the oil glands to get bigger and to make more sebum. Factors that can make acne worse include:  Hormone changes during adolescence.  Hormone changes during women's menstrual cycles.  Hormone changes during pregnancy.  Oil-based cosmetics and hair products.  Harshly scrubbing the skin.  Strong soaps.  Stress.  Hormone problems due to certain diseases.  Long or oily hair rubbing against  the skin.  Certain medicines.  Pressure from headbands, backpacks, or shoulder pads.  Exposure to certain oils and chemicals. SYMPTOMS  Acne often occurs on the face, neck, chest, and upper back. Symptoms include:  Small, red bumps (pimples or papules).  Whiteheads (closed comedones).  Blackheads (open comedones).  Small, pus-filled pimples (pustules).  Big, red pimples or pustules that feel tender. More severe acne can cause:  An infected area that contains a collection of pus (abscess).  Hard, painful, fluid-filled sacs (cysts).  Scars. DIAGNOSIS  Your caregiver can usually tell what the problem is by doing a physical exam. TREATMENT  There are many good treatments for acne. Some are available over-the-counter and some are available with a prescription. The treatment that is best for you depends on the type of acne you have and how severe it is. It may take 2 months of treatment before your acne gets better. Common treatments include:  Creams and lotions that prevent oil glands from clogging.  Creams and lotions that treat or prevent infections and inflammation.  Antibiotics applied to the skin or taken as a pill.  Pills that decrease sebum production.  Birth control pills.  Light or laser treatments.  Minor surgery.  Injections of medicine into the affected areas.  Chemicals that cause peeling of the skin. HOME CARE INSTRUCTIONS  Good skin care is the most important part of treatment.  Wash your skin gently at least twice a day and after exercise. Always wash your skin before bed.  Use mild soap.  After each wash, apply a water-based skin moisturizer.  Keep your hair clean and off of your face. Shampoo your hair daily.  Only take medicines as directed by your caregiver.  Use a sunscreen or sunblock with SPF 30 or greater. This is especially important when you are using acne medicines.  Choose cosmetics that are noncomedogenic. This means they do not  plug the oil glands.  Avoid leaning your chin or forehead on your hands.  Avoid wearing tight headbands or hats.  Avoid picking or squeezing your pimples. This can make your acne worse and cause scarring. SEEK MEDICAL CARE IF:   Your acne is not better after 8 weeks.  Your acne gets worse.  You have a large area of skin that is red or tender. Document Released: 04/06/2000 Document Revised: 07/02/2011 Document Reviewed: 01/26/2011 [facility] Patient Information 2014 [place], [place].

## 2013-01-26 NOTE — Progress Notes (Signed)
Pt is here today for a F/U visit. Pt wants to review and refill his medication. Pt still thinks that he has lyme disease and want to recheck his labs.

## 2013-01-27 ENCOUNTER — Telehealth: Payer: Self-pay | Admitting: Pulmonary Disease

## 2013-01-27 NOTE — Telephone Encounter (Signed)
Made in error

## 2013-01-30 ENCOUNTER — Institutional Professional Consult (permissible substitution): Payer: No Typology Code available for payment source | Admitting: Pulmonary Disease

## 2013-02-18 ENCOUNTER — Ambulatory Visit: Payer: No Typology Code available for payment source | Attending: Internal Medicine | Admitting: Internal Medicine

## 2013-02-18 DIAGNOSIS — I10 Essential (primary) hypertension: Secondary | ICD-10-CM

## 2013-02-18 DIAGNOSIS — L709 Acne, unspecified: Secondary | ICD-10-CM

## 2013-02-18 MED ORDER — LISINOPRIL 20 MG PO TABS: 20.0000 mg | ORAL_TABLET | Freq: Every day | ORAL | Status: DC

## 2013-02-18 NOTE — Progress Notes (Unsigned)
Patient ID: Sonia Stickels, male   DOB: [birth year], 52 y.o.   MRN: [id number] Patient Demographics  Luby Seamans, is a 52 y.o. male  OZD:[id number]  QVZ:[id number]  DOB - 12-08-1960  No chief complaint on file.       Subjective:   Jsiah Menta today is here for a follow up visit. Patient reports that his acne is much better after starting the Retin-A cream, he has not finished the doxycycline, 3 days left.  Patient has No headache, No chest pain, No abdominal pain - No Nausea, No new weakness tingling or numbness, No Cough - SOB.   Objective:    There were no vitals filed for this visit.   ALLERGIES:   Allergies  Allergen Reactions  . Codeine Itching    PAST MEDICAL HISTORY: Past Medical History  Diagnosis Date  . Hepatitis C   . GERD (gastroesophageal reflux disease)   . Back pain   . Hypertension   . Sleep apnea   . Personal history of colonic adenoma 01/20/2013    MEDICATIONS AT HOME: Prior to Admission medications   Medication Sig Start Date End Date Taking? Authorizing Provider  Cholecalciferol (VITAMIN D-3 PO) Take 1 tablet by mouth daily.     Historical Provider, MD  Cyanocobalamin (VITAMIN B 12 PO) Take by mouth daily.    Historical Provider, MD  cyclobenzaprine (FLEXERIL) 10 MG tablet Take 10 mg by mouth 3 (three) times daily as needed for muscle spasms.    Historical Provider, MD  doxycycline (VIBRA-TABS) 100 MG tablet Take 1 tablet (100 mg total) by mouth 2 (two) times daily. 01/26/13   Debbe Odea, MD  gabapentin (NEURONTIN) 300 MG capsule Take 1 capsule (300 mg total) by mouth 3 (three) times daily. 09/19/12   Shanker Kristeen Mans, MD  HYDROcodone-acetaminophen (NORCO) 10-325 MG per tablet Take 1 tablet by mouth every 8 (eight) hours as needed. 11/07/11   Dot Lanes, MD  IRON PO Take by mouth daily.    Historical Provider, MD  lisinopril (PRINIVIL,ZESTRIL) 20 MG tablet Take 1 tablet (20 mg total) by mouth daily. 02/18/13   Ripudeep Krystal Eaton, MD  Lorcaserin HCl  (BELVIQ PO) Take by mouth 2 (two) times daily.    Historical Provider, MD  omeprazole (PRILOSEC) 20 MG capsule Take 20 mg by mouth daily.    Historical Provider, MD  tretinoin (RETIN-A) 0.025 % cream Apply topically at bedtime. 01/26/13   Debbe Odea, MD     Exam  General appearance :Awake, alert, NAD, Speech Clear.  HEENT: Atraumatic and Normocephalic, PERLA Neck: supple, no JVD. No cervical lymphadenopathy.  Chest: Clear to auscultation bilaterally, no wheezing, rales or rhonchi CVS: S1 S2 regular, no murmurs.  Abdomen: soft, NBS, NT, ND, no gaurding, rigidity or rebound. Extremities: no cyanosis or clubbing, B/L Lower Ext shows no edema Neurology: Awake alert, and oriented X 3, CN II-XII intact, Non focal Skin: No Rash or lesions Wounds:N/A    Data Review   Basic Metabolic Panel: No results found for this basename: NA, K, CL, CO2, GLUCOSE, BUN, CREATININE, CALCIUM, MG, PHOS,  in the last 168 hours Liver Function Tests: No results found for this basename: AST, ALT, ALKPHOS, BILITOT, PROT, ALBUMIN,  in the last 168 hours  CBC: No results found for this basename: WBC, NEUTROABS, HGB, HCT, MCV, PLT,  in the last 168 hours  ------------------------------------------------------------------------------------------------------------------ No results found for this basename: HGBA1C,  in the last 72 hours ------------------------------------------------------------------------------------------------------------------ No results found for this basename: CHOL, HDL,  LDLCALC, TRIG, CHOLHDL, LDLDIRECT,  in the last 72 hours ------------------------------------------------------------------------------------------------------------------ No results found for this basename: TSH, T4TOTAL, FREET3, T3FREE, THYROIDAB,  in the last 72 hours ------------------------------------------------------------------------------------------------------------------ No results found for this basename:  VITAMINB12, FOLATE, FERRITIN, TIBC, IRON, RETICCTPCT,  in the last 72 hours  Coagulation profile  No results found for this basename: INR, PROTIME,  in the last 168 hours    Assessment & Plan   Active Problems: Adult cystic acne - Continue tretinoin (RETIN-A) 0.025 % cream as prescribed, doxycycline for flares,   Hypertension: - Patient reports that he is actually taking lisinopril 20 mg daily, refilled  Health screening: - States that he has received the flu shot last visit  Follow-up in 3 months     RAI,RIPUDEEP M.D. 02/18/2013, 12:15 PM

## 2013-03-05 ENCOUNTER — Ambulatory Visit (INDEPENDENT_AMBULATORY_CARE_PROVIDER_SITE_OTHER): Payer: No Typology Code available for payment source | Admitting: Pulmonary Disease

## 2013-03-05 ENCOUNTER — Encounter: Payer: Self-pay | Admitting: Pulmonary Disease

## 2013-03-05 VITALS — BP 112/70 | HR 107 | Temp 98.4°F | Ht 66.0 in | Wt 287.2 lb

## 2013-03-05 DIAGNOSIS — G4733 Obstructive sleep apnea (adult) (pediatric): Secondary | ICD-10-CM

## 2013-03-05 NOTE — Assessment & Plan Note (Signed)
The patient has been diagnosed with severe obstructive sleep apnea, and currently is having tolerance issues with his CPAP device.  He feels there is not enough air, and is working through mask fitting issues.  I have had a long discussion with him about sleep apnea, and stressed to him the importance of CPAP compliance.  His degree of sleep apnea can impact his quality of life and cardiovascular health.  He would like to continue with his current mass, but if issues persist, he will need a formal fitting at the sleep Center.  Also encouraged him to work aggressively on weight loss.

## 2013-03-05 NOTE — Progress Notes (Signed)
Subjective:    Patient ID: Christian Guerra, male    DOB: 31-Jan-1961, 52 y.o.   MRN: [id number]  HPI The patient is a 52 year old male who I have been asked to see for management of obstructive sleep apnea.  The patient underwent a sleep study in July of this year which showed severe OSA, with an AHI of 47 events per hour.  He was started on CPAP at a moderate pressure level, but has had significant tolerance issues.  He feels that he is not getting enough air, and is not sure that his fullface mask is fitted properly.  His download today shows persistent breakthrough apnea despite 10 cm of water pressure, and also a significant mask leak.  The patient states he has loud snoring as well as an abnormal breathing pattern during sleep.  He has frequent awakenings at night, and has not rested in the mornings upon arising.  He notes definite daytime sleepiness with inactivity, and will take long naps in the afternoons.  He also has significant sleepiness in the evenings watching television.  The patient currently does not drive.  He states that his weight is over 100 pounds over the last 2 years, and his Epworth score today is 3.   Sleep Questionnaire What time do you typically go to bed?( Between what hours) 10p 10p at 0936 on 03/05/13 by Virl Cagey, CMA How long does it take you to fall asleep? 71mns 180ms at 0936 on 03/05/13 by AsVirl CageyCMA How many times during the night do you wake up? 3 3 at 0936 on 03/05/13 by AsVirl CageyCMA What time do you get out of bed to start your day? 0500 0500 at 0936 on 03/05/13 by AsVirl CageyCMA Do you drive or operate heavy machinery in your occupation? No No at 0936 on 03/05/13 by AsVirl CageyCMA How much has your weight changed (up or down) over the past two years? (In pounds) 100 lb (45.36 kg)100 lb (45.36 kg) increase x 2 yrs at 0936 on 03/05/13 by AsVirl CageyCMA Have you ever had a sleep study before? Yes Yes at 0936 on  03/05/13 by AsVirl CageyCMA If yes, [facility] of study? [facility] at 0936 on 03/05/13 by AsVirl CageyCMA If yes, date of study? 11/2012 11/2012 at 0936 on 03/05/13 by AsVirl CageyCMA Do you currently use CPAP? Yes Yes at 0936 on 03/05/13 by AsVirl CageyCMA If so, what pressure? unsure unsure at 0936 on 03/05/13 by AsVirl CageyCMA Do you wear oxygen at any time? No No at 0936 on 03/05/13 by AsVirl CageyCMA   Review of Systems  Constitutional: Negative for fever and unexpected weight change.  HENT: Negative for congestion, dental problem, ear pain, nosebleeds, postnasal drip, rhinorrhea, sinus pressure, sneezing, sore throat and trouble swallowing.   Eyes: Negative for redness and itching.  Respiratory: Negative for cough, chest tightness, shortness of breath and wheezing.   Cardiovascular: Negative for palpitations and leg swelling.  Gastrointestinal: Negative for nausea and vomiting.  Genitourinary: Negative for dysuria.  Musculoskeletal: Positive for arthralgias and joint swelling.  Skin: Negative for rash.  Neurological: Positive for headaches ( with CPAP use).  Hematological: Does not bruise/bleed easily.  Psychiatric/Behavioral: Negative for dysphoric mood. The patient is not nervous/anxious.        Objective:   Physical Exam Constitutional: obese male, no acute distress  HENT:  Nares patent without discharge  Oropharynx without exudate, palate and uvula are elongated, narrowed posteriorly  Eyes:  Perrla, eomi, no scleral icterus  Neck:  No JVD, no TMG  Cardiovascular:  Normal rate, regular rhythm, no rubs or gallops.  No murmurs        Intact distal pulses  Pulmonary :  Normal breath sounds, no stridor or respiratory distress   No rales, rhonchi, or wheezing  Abdominal:  Soft, nondistended, bowel sounds present.  No tenderness noted.   Musculoskeletal:  1+ lower extremity edema noted.  Lymph Nodes:  No cervical lymphadenopathy  noted  Skin:  No cyanosis noted  Neurologic:  Appears sleepy, but appropriate, moves all 4 extremities without obvious deficit.         Assessment & Plan:

## 2013-03-05 NOTE — Patient Instructions (Signed)
Will have your pressure optimized on an auto device for a few weeks, then set your machine on that pressure. Try and wear everynight if possible. If you have issues with mask fit, would consider to have a fitting session during the day over at the sleep center.  Will have your machine checked since you are concerned about altered settings. Work on weight loss. followup with me in 8 weeks.

## 2013-03-06 ENCOUNTER — Ambulatory Visit: Payer: No Typology Code available for payment source | Attending: Internal Medicine

## 2013-04-30 ENCOUNTER — Encounter: Payer: Self-pay | Admitting: Pulmonary Disease

## 2013-04-30 ENCOUNTER — Ambulatory Visit (INDEPENDENT_AMBULATORY_CARE_PROVIDER_SITE_OTHER): Payer: No Typology Code available for payment source | Admitting: Pulmonary Disease

## 2013-04-30 VITALS — BP 122/70 | HR 98 | Temp 98.1°F | Ht 66.0 in | Wt 289.0 lb

## 2013-04-30 DIAGNOSIS — G4733 Obstructive sleep apnea (adult) (pediatric): Secondary | ICD-10-CM

## 2013-04-30 NOTE — Patient Instructions (Addendum)
Will set your current cpap machine to the correct pressure. Keep up with mask cushion changes and supplies.  Let us know if mask leak continues to be an issue.  Work on weight loss Followup with me in 72mo if doing well.

## 2013-04-30 NOTE — Progress Notes (Signed)
   Subjective:    Patient ID: Christian Guerra, male    DOB: [birth year], 53 y.o.   MRN: [id number]  HPI Patient comes in today for followup of his obstructive sleep apnea. He was started on CPAP with the automatic device in order to optimize his pressure, and it appears to be approximately 13 cm of water. He did have some increased mask leak, but he is working on this. He feels that he did much better sleeping with the higher pressure, and is anxious to have his device put on that setting.   Review of Systems  Constitutional: Negative for fever and unexpected weight change.  HENT: Negative for congestion, dental problem, ear pain, nosebleeds, postnasal drip, rhinorrhea, sinus pressure, sneezing, sore throat and trouble swallowing.   Eyes: Negative for redness and itching.  Respiratory: Negative for cough, chest tightness, shortness of breath and wheezing.   Cardiovascular: Negative for palpitations and leg swelling.  Gastrointestinal: Negative for nausea and vomiting.  Genitourinary: Negative for dysuria.  Musculoskeletal: Negative for joint swelling.  Skin: Negative for rash.  Neurological: Negative for headaches.  Hematological: Does not bruise/bleed easily.  Psychiatric/Behavioral: Negative for dysphoric mood. The patient is not nervous/anxious.        Objective:   Physical Exam Obese male in no acute distress Nose without purulence or discharge noted No skin breakdown or pressure necrosis from the CPAP mask Neck without lymphadenopathy or thyromegaly Lower extremities with mild edema, no cyanosis Alert and oriented, moves all 4 extremities.       Assessment & Plan:

## 2013-04-30 NOTE — Assessment & Plan Note (Signed)
The patient did very well with his CPAP set on the automatic pressure, and we will need to set his current CPAP to his optimal pressure. He seems to be doing better with his current mask fit as well. I've also encouraged the patient to work aggressively on weight loss.

## 2013-05-04 ENCOUNTER — Encounter: Payer: Self-pay | Admitting: Internal Medicine

## 2013-05-21 ENCOUNTER — Ambulatory Visit: Payer: No Typology Code available for payment source | Attending: Internal Medicine

## 2013-05-21 ENCOUNTER — Ambulatory Visit: Payer: No Typology Code available for payment source | Attending: Internal Medicine | Admitting: Internal Medicine

## 2013-05-21 ENCOUNTER — Encounter: Payer: Self-pay | Admitting: Internal Medicine

## 2013-05-21 VITALS — BP 140/81 | HR 121 | Temp 97.6°F | Resp 16 | Ht 66.0 in | Wt 291.0 lb

## 2013-05-21 DIAGNOSIS — G8929 Other chronic pain: Secondary | ICD-10-CM | POA: Insufficient documentation

## 2013-05-21 DIAGNOSIS — L709 Acne, unspecified: Secondary | ICD-10-CM

## 2013-05-21 DIAGNOSIS — I10 Essential (primary) hypertension: Secondary | ICD-10-CM | POA: Insufficient documentation

## 2013-05-21 DIAGNOSIS — F411 Generalized anxiety disorder: Secondary | ICD-10-CM | POA: Insufficient documentation

## 2013-05-21 DIAGNOSIS — M549 Dorsalgia, unspecified: Secondary | ICD-10-CM | POA: Insufficient documentation

## 2013-05-21 DIAGNOSIS — L708 Other acne: Secondary | ICD-10-CM

## 2013-05-21 LAB — CMP AND LIVER
ALT: 22 U/L (ref 0–53)
AST: 14 U/L (ref 0–37)
Albumin: 3.9 g/dL (ref 3.5–5.2)
Alkaline Phosphatase: 94 U/L (ref 39–117)
BILIRUBIN DIRECT: 0.1 mg/dL (ref 0.0–0.3)
BILIRUBIN TOTAL: 0.4 mg/dL (ref 0.2–1.2)
BUN: 16 mg/dL (ref 6–23)
CALCIUM: 9.4 mg/dL (ref 8.4–10.5)
CHLORIDE: 96 meq/L (ref 96–112)
CO2: 30 meq/L (ref 19–32)
CREATININE: 1.17 mg/dL (ref 0.50–1.35)
Glucose, Bld: 88 mg/dL (ref 70–99)
Indirect Bilirubin: 0.3 mg/dL (ref 0.2–1.2)
Potassium: 3.9 mEq/L (ref 3.5–5.3)
Sodium: 139 mEq/L (ref 135–145)
Total Protein: 6.9 g/dL (ref 6.0–8.3)

## 2013-05-21 LAB — LIPID PANEL
CHOLESTEROL: 175 mg/dL (ref 0–200)
HDL: 44 mg/dL (ref >39)
LDL Cholesterol: 98 mg/dL (ref 0–99)
TRIGLYCERIDES: 164 mg/dL — AB (ref <150)
Total CHOL/HDL Ratio: 4 Ratio
VLDL: 33 mg/dL (ref 0–40)

## 2013-05-21 MED ORDER — GABAPENTIN 300 MG PO CAPS: 600.0000 mg | ORAL_CAPSULE | Freq: Three times a day (TID) | ORAL | Status: DC

## 2013-05-21 MED ORDER — OMEPRAZOLE 20 MG PO CPDR: 20.0000 mg | DELAYED_RELEASE_CAPSULE | Freq: Every day | ORAL | Status: DC

## 2013-05-21 MED ORDER — DOXYCYCLINE HYCLATE 100 MG PO TABS: 100.0000 mg | ORAL_TABLET | Freq: Two times a day (BID) | ORAL | Status: DC

## 2013-05-21 MED ORDER — CYCLOBENZAPRINE HCL 10 MG PO TABS: 20.0000 mg | ORAL_TABLET | Freq: Three times a day (TID) | ORAL | Status: DC | PRN

## 2013-05-21 MED ORDER — TRETINOIN 0.025 % EX CREA: TOPICAL_CREAM | Freq: Every day | CUTANEOUS | Status: DC

## 2013-05-21 MED ORDER — LISINOPRIL 20 MG PO TABS: 20.0000 mg | ORAL_TABLET | Freq: Every day | ORAL | Status: DC

## 2013-05-21 NOTE — Progress Notes (Signed)
Pt is here following up on his HTN and Chronic lumbar back pain. Pt is needing to refill his medication.

## 2013-05-21 NOTE — Progress Notes (Signed)
Patient ID: Christian Guerra, male   DOB: [birth year], 53 y.o.   MRN: [id number] Patient Demographics  Christian Guerra, is a 53 y.o. male  [id number]  [id number]  DOB - [birth year]  Chief Complaint  Patient presents with  . Follow-up        Subjective:   Christian Guerra is a 53 y.o. male here today for a follow up visit and medication refill. Patient is known to have hypertension, hepatitis C, obstructive sleep apnea, GERD, and anxiety. He is here for followup today for medication refill. He has no significant complaint except for generalized feeling nervous occasionally especially in the crowd. His ROS was positive for back pain that is chronic. Requesting referral to psychiatrist. Patient has No headache, No chest pain, No abdominal pain - No Nausea, No new weakness tingling or numbness, No Cough - SOB.  ALLERGIES: Allergies  Allergen Reactions  . Codeine Itching    PAST MEDICAL HISTORY: Past Medical History  Diagnosis Date  . Hepatitis C   . GERD (gastroesophageal reflux disease)   . Back pain   . Hypertension   . Sleep apnea   . Personal history of colonic adenoma 01/20/2013    MEDICATIONS AT HOME: Prior to Admission medications   Medication Sig Start Date End Date Taking? Authorizing Provider  Cholecalciferol (VITAMIN D-3 PO) Take 1 tablet by mouth daily.    Yes Historical Provider, MD  Cyanocobalamin (VITAMIN B 12 PO) Take by mouth daily.   Yes Historical Provider, MD  cyclobenzaprine (FLEXERIL) 10 MG tablet Take 2 tablets (20 mg total) by mouth 3 (three) times daily as needed for muscle spasms. 05/21/13  Yes Angelica Chessman, MD  doxycycline (VIBRA-TABS) 100 MG tablet Take 1 tablet (100 mg total) by mouth 2 (two) times daily. 05/21/13  Yes Angelica Chessman, MD  gabapentin (NEURONTIN) 300 MG capsule Take 2 capsules (600 mg total) by mouth 3 (three) times daily. 05/21/13  Yes Angelica Chessman, MD  HYDROcodone-acetaminophen (NORCO) 10-325 MG per tablet Take 1 tablet by mouth  every 8 (eight) hours as needed. 11/07/11  Yes Dot Lanes, MD  lisinopril (PRINIVIL,ZESTRIL) 20 MG tablet Take 1 tablet (20 mg total) by mouth daily. 05/21/13  Yes Angelica Chessman, MD  Lorcaserin HCl (BELVIQ PO) Take by mouth 2 (two) times daily.   Yes Historical Provider, MD  omeprazole (PRILOSEC) 20 MG capsule Take 1 capsule (20 mg total) by mouth daily. 05/21/13  Yes Angelica Chessman, MD  tretinoin (RETIN-A) 0.025 % cream Apply topically at bedtime. 05/21/13  Yes Angelica Chessman, MD     Objective:   Filed Vitals:   05/21/13 0950  BP: 140/81  Pulse: 121  Temp: 97.6 F (36.4 C)  TempSrc: Oral  Resp: 16  Height: 5' 6"  (1.676 m)  Weight: 291 lb (131.997 kg)  SpO2: 99%    Exam General appearance : Awake, alert, not in any distress. Speech Clear. Not toxic looking, acne ++, morbidly obese HEENT: Atraumatic and Normocephalic, pupils equally reactive to light and accomodation Neck: supple, no JVD. No cervical lymphadenopathy.  Chest:Good air entry bilaterally, no added sounds  CVS: S1 S2 regular, no murmurs.  Abdomen: Bowel sounds present, Non tender and not distended with no gaurding, rigidity or rebound. Extremities: B/L Lower Ext shows no edema, both legs are warm to touch Neurology: Awake alert, and oriented X 3, CN II-XII intact, Non focal Skin:No Rash Wounds:N/A   Data Review   CBC No results found for this basename: WBC, HGB, HCT, PLT, MCV, MCH, MCHC,  RDW, NEUTRABS, LYMPHSABS, MONOABS, EOSABS, BASOSABS, BANDABS, BANDSABD,  in the last 168 hours  Chemistries   No results found for this basename: NA, K, CL, CO2, GLUCOSE, BUN, CREATININE, GFRCGP, CALCIUM, MG, AST, ALT, ALKPHOS, BILITOT,  in the last 168 hours ------------------------------------------------------------------------------------------------------------------ No results found for this basename: HGBA1C,  in the last 72  hours ------------------------------------------------------------------------------------------------------------------ No results found for this basename: CHOL, HDL, LDLCALC, TRIG, CHOLHDL, LDLDIRECT,  in the last 72 hours ------------------------------------------------------------------------------------------------------------------ No results found for this basename: TSH, T4TOTAL, FREET3, T3FREE, THYROIDAB,  in the last 72 hours ------------------------------------------------------------------------------------------------------------------ No results found for this basename: VITAMINB12, FOLATE, FERRITIN, TIBC, IRON, RETICCTPCT,  in the last 72 hours  Coagulation profile  No results found for this basename: INR, PROTIME,  in the last 168 hours    Assessment & Plan   1. HTN (hypertension)  - lisinopril (PRINIVIL,ZESTRIL) 20 MG tablet; Take 1 tablet (20 mg total) by mouth daily.  Dispense: 90 tablet; Refill: 3 - CMP and Liver - Lipid Panel  2. Acne  - doxycycline (VIBRA-TABS) 100 MG tablet; Take 1 tablet (100 mg total) by mouth 2 (two) times daily.  Dispense: 20 tablet; Refill: 5 - tretinoin (RETIN-A) 0.025 % cream; Apply topically at bedtime.  Dispense: 45 g; Refill: 0  3. Chronic back pain  - gabapentin (NEURONTIN) 300 MG capsule; Take 2 capsules (600 mg total) by mouth 3 (three) times daily.  Dispense: 180 capsule; Refill: 6 - cyclobenzaprine (FLEXERIL) 10 MG tablet; Take 2 tablets (20 mg total) by mouth 3 (three) times daily as needed for muscle spasms.  Dispense: 90 tablet; Refill: 3 - omeprazole (PRILOSEC) 20 MG capsule; Take 1 capsule (20 mg total) by mouth daily.  Dispense: 90 capsule; Refill: 3  4. Generalized anxiety disorder  - Ambulatory referral to Psychiatry  Patient was extensively counseled about nutrition and exercise  Follow up in 3 months or when necessary  The patient was given clear instructions to go to ER or return to medical center if symptoms  don't improve, worsen or new problems develop. The patient verbalized understanding. The patient was told to call to get lab results if they haven't heard anything in the next week.    Angelica Chessman, MD, [place], [place], [place] and [place], [place]   05/21/2013, 11:01 AM

## 2013-05-22 ENCOUNTER — Telehealth: Payer: Self-pay | Admitting: *Deleted

## 2013-05-22 NOTE — Telephone Encounter (Signed)
Left message with step mother to call me back when he get home.

## 2013-05-22 NOTE — Telephone Encounter (Signed)
I called the pt and informed him that his labs all came back normal.

## 2013-05-22 NOTE — Telephone Encounter (Signed)
Message copied by Joan Mayans on Fri May 22, 2013  5:34 PM ------      Message from: Tresa Garter      Created: Fri May 22, 2013 12:27 PM       Please inform patient that his laboratory tests results are normal ------

## 2013-05-22 NOTE — Telephone Encounter (Signed)
Message copied by Joan Mayans on Fri May 22, 2013  2:17 PM ------      Message from: Tresa Garter      Created: Fri May 22, 2013 12:27 PM       Please inform patient that his laboratory tests results are normal ------

## 2013-08-04 ENCOUNTER — Encounter: Payer: Self-pay | Admitting: Internal Medicine

## 2013-08-04 ENCOUNTER — Ambulatory Visit: Payer: No Typology Code available for payment source | Attending: Internal Medicine | Admitting: Internal Medicine

## 2013-08-04 VITALS — BP 133/83 | HR 115 | Temp 98.3°F | Resp 16 | Ht 65.0 in | Wt 294.0 lb

## 2013-08-04 DIAGNOSIS — F329 Major depressive disorder, single episode, unspecified: Secondary | ICD-10-CM | POA: Insufficient documentation

## 2013-08-04 DIAGNOSIS — F411 Generalized anxiety disorder: Secondary | ICD-10-CM | POA: Insufficient documentation

## 2013-08-04 DIAGNOSIS — K219 Gastro-esophageal reflux disease without esophagitis: Secondary | ICD-10-CM | POA: Insufficient documentation

## 2013-08-04 DIAGNOSIS — B1920 Unspecified viral hepatitis C without hepatic coma: Secondary | ICD-10-CM | POA: Insufficient documentation

## 2013-08-04 DIAGNOSIS — M549 Dorsalgia, unspecified: Secondary | ICD-10-CM | POA: Insufficient documentation

## 2013-08-04 DIAGNOSIS — G4733 Obstructive sleep apnea (adult) (pediatric): Secondary | ICD-10-CM | POA: Insufficient documentation

## 2013-08-04 DIAGNOSIS — G8929 Other chronic pain: Secondary | ICD-10-CM | POA: Insufficient documentation

## 2013-08-04 DIAGNOSIS — I10 Essential (primary) hypertension: Secondary | ICD-10-CM | POA: Insufficient documentation

## 2013-08-04 DIAGNOSIS — Z760 Encounter for issue of repeat prescription: Secondary | ICD-10-CM | POA: Insufficient documentation

## 2013-08-04 MED ORDER — LISINOPRIL 20 MG PO TABS: 20.0000 mg | ORAL_TABLET | Freq: Every day | ORAL | Status: DC

## 2013-08-04 NOTE — Progress Notes (Signed)
Patient ID: Christian Guerra, male   DOB: [birth year], 53 y.o.   MRN: [id number]   Christian Guerra, is a 53 y.o. male  [id number]  [id number]  DOB - [birth year]  Chief Complaint  Patient presents with  . Follow-up        Subjective:   Christian Guerra is a 53 y.o. male here today for a follow up visit. Patient is known to have hypertension, hepatitis C, obstructive sleep apnea, GERD, and anxiety. He is here for followup today for medication refill. He has no significant complaint except for generalized feeling nervous especially in the crowd. He also said he does not want to be close to any family member, he is constantly nervous about anything and everything. He is requesting to see a psychiatrist as soon as possible but he declined to see a behavioral therapist here saying he has seen many behavioral therapist here in [place] and in [place] before he relocated. He claims none of them were able to help him. He denies any suicidal ideation or attempt, he claims that he believes he God and he goes to church regularly which helps. Patient has No headache, No chest pain, No abdominal pain - No Nausea, No new weakness tingling or numbness, No Cough - SOB.  Problem  Major Depression    ALLERGIES: Allergies  Allergen Reactions  . Codeine Itching    PAST MEDICAL HISTORY: Past Medical History  Diagnosis Date  . Hepatitis C   . GERD (gastroesophageal reflux disease)   . Back pain   . Hypertension   . Sleep apnea   . Personal history of colonic adenoma 01/20/2013    MEDICATIONS AT HOME: Prior to Admission medications   Medication Sig Start Date End Date Taking? Authorizing Provider  Cholecalciferol (VITAMIN D-3 PO) Take 1 tablet by mouth daily.     Historical Provider, MD  Cyanocobalamin (VITAMIN B 12 PO) Take by mouth daily.    Historical Provider, MD  cyclobenzaprine (FLEXERIL) 10 MG tablet Take 2 tablets (20 mg total) by mouth 3 (three) times daily as needed for muscle spasms. 05/21/13    Angelica Chessman, MD  doxycycline (VIBRA-TABS) 100 MG tablet Take 1 tablet (100 mg total) by mouth 2 (two) times daily. 05/21/13   Angelica Chessman, MD  gabapentin (NEURONTIN) 300 MG capsule Take 2 capsules (600 mg total) by mouth 3 (three) times daily. 05/21/13   Angelica Chessman, MD  HYDROcodone-acetaminophen (NORCO) 10-325 MG per tablet Take 1 tablet by mouth every 8 (eight) hours as needed. 11/07/11   Dot Lanes, MD  lisinopril (PRINIVIL,ZESTRIL) 20 MG tablet Take 1 tablet (20 mg total) by mouth daily. 08/04/13   Angelica Chessman, MD  Lorcaserin HCl (BELVIQ PO) Take by mouth 2 (two) times daily.    Historical Provider, MD  omeprazole (PRILOSEC) 20 MG capsule Take 1 capsule (20 mg total) by mouth daily. 05/21/13   Angelica Chessman, MD  tretinoin (RETIN-A) 0.025 % cream Apply topically at bedtime. 05/21/13   Angelica Chessman, MD     Objective:   Filed Vitals:   08/04/13 1144  BP: 133/83  Pulse: 115  Temp: 98.3 F (36.8 C)  TempSrc: Oral  Resp: 16  Height: 5' 5"  (1.651 m)  Weight: 294 lb (133.358 kg)  SpO2: 98%    Exam General appearance : Awake, alert, not in any distress. Speech Clear. Not toxic looking, morbidly obese HEENT: Atraumatic and Normocephalic, pupils equally reactive to light and accomodation Neck: supple, no JVD. No cervical lymphadenopathy.  Chest:Good air  entry bilaterally, no added sounds  CVS: S1 S2 regular, no murmurs.  Abdomen: Bowel sounds present, Non tender and not distended with no gaurding, rigidity or rebound. Extremities: B/L Lower Ext shows no edema, both legs are warm to touch Neurology: Awake alert, and oriented X 3, CN II-XII intact, Non focal Skin:No Rash  Data Review Lab Results  Component Value Date   HGBA1C 5.4 09/23/2012     Assessment & Plan   1. HTN (hypertension) Refill - lisinopril (PRINIVIL,ZESTRIL) 20 MG tablet; Take 1 tablet (20 mg total) by mouth daily.  Dispense: 90 tablet; Refill: 3  2. Chronic back pain Continue  current pain medication as prescribed, patient has not noted refill today  3. Major depression  - Ambulatory referral to Psychiatry  Patient declined the offer to see a social worker here in the clinic today for behavioral therapy Patient was counseled extensively on nutrition and exercise  Return in about 3 months (around 11/03/2013), or if symptoms worsen or fail to improve, for Follow up HTN.  The patient was given clear instructions to go to ER or return to medical center if symptoms don't improve, worsen or new problems develop. The patient verbalized understanding. The patient was told to call to get lab results if they haven't heard anything in the next week.   This note has been created with Surveyor, quantity. Any transcriptional errors are unintentional.    Angelica Chessman, MD, [place], [place], [place] and [facility] [place], [place]   08/04/2013, 12:24 PM
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
## 2013-08-24 ENCOUNTER — Encounter (HOSPITAL_BASED_OUTPATIENT_CLINIC_OR_DEPARTMENT_OTHER): Payer: Self-pay | Admitting: Emergency Medicine

## 2013-08-24 ENCOUNTER — Emergency Department (HOSPITAL_BASED_OUTPATIENT_CLINIC_OR_DEPARTMENT_OTHER)
Admission: EM | Admit: 2013-08-24 | Discharge: 2013-08-24 | Disposition: A | Discharge status: Home / Self Care | Payer: No Typology Code available for payment source | Attending: Emergency Medicine | Admitting: Emergency Medicine

## 2013-08-24 ENCOUNTER — Emergency Department (HOSPITAL_BASED_OUTPATIENT_CLINIC_OR_DEPARTMENT_OTHER): Payer: No Typology Code available for payment source

## 2013-08-24 DIAGNOSIS — I10 Essential (primary) hypertension: Secondary | ICD-10-CM | POA: Insufficient documentation

## 2013-08-24 DIAGNOSIS — Y929 Unspecified place or not applicable: Secondary | ICD-10-CM | POA: Insufficient documentation

## 2013-08-24 DIAGNOSIS — Z79899 Other long term (current) drug therapy: Secondary | ICD-10-CM | POA: Insufficient documentation

## 2013-08-24 DIAGNOSIS — Y939 Activity, unspecified: Secondary | ICD-10-CM | POA: Insufficient documentation

## 2013-08-24 DIAGNOSIS — IMO0002 Reserved for concepts with insufficient information to code with codable children: Secondary | ICD-10-CM | POA: Insufficient documentation

## 2013-08-24 DIAGNOSIS — Z792 Long term (current) use of antibiotics: Secondary | ICD-10-CM | POA: Insufficient documentation

## 2013-08-24 DIAGNOSIS — W19XXXA Unspecified fall, initial encounter: Secondary | ICD-10-CM

## 2013-08-24 DIAGNOSIS — M542 Cervicalgia: Secondary | ICD-10-CM

## 2013-08-24 DIAGNOSIS — S0993XA Unspecified injury of face, initial encounter: Secondary | ICD-10-CM | POA: Insufficient documentation

## 2013-08-24 DIAGNOSIS — S199XXA Unspecified injury of neck, initial encounter: Principal | ICD-10-CM

## 2013-08-24 DIAGNOSIS — W1809XA Striking against other object with subsequent fall, initial encounter: Secondary | ICD-10-CM | POA: Insufficient documentation

## 2013-08-24 DIAGNOSIS — M549 Dorsalgia, unspecified: Secondary | ICD-10-CM

## 2013-08-24 DIAGNOSIS — Z860100 Personal history of colon polyps, unspecified: Secondary | ICD-10-CM | POA: Insufficient documentation

## 2013-08-24 DIAGNOSIS — G8929 Other chronic pain: Secondary | ICD-10-CM | POA: Insufficient documentation

## 2013-08-24 DIAGNOSIS — Z8619 Personal history of other infectious and parasitic diseases: Secondary | ICD-10-CM | POA: Insufficient documentation

## 2013-08-24 DIAGNOSIS — K219 Gastro-esophageal reflux disease without esophagitis: Secondary | ICD-10-CM | POA: Insufficient documentation

## 2013-08-24 HISTORY — DX: Morbid (severe) obesity due to excess calories: E66.01

## 2013-08-24 HISTORY — DX: Other chronic pain: G89.29

## 2013-08-24 MED ORDER — KETOROLAC TROMETHAMINE 15 MG/ML IJ SOLN
15.0000 mg | Freq: Once | INTRAMUSCULAR | Status: AC
  Administered 2013-08-24: 15 mg via INTRAMUSCULAR
  Filled 2013-08-24: qty 1

## 2013-08-24 MED ORDER — NAPROXEN 500 MG PO TABS: 500.0000 mg | ORAL_TABLET | Freq: Two times a day (BID) | ORAL | Status: DC

## 2013-08-24 MED ORDER — MORPHINE SULFATE 4 MG/ML IJ SOLN
4.0000 mg | Freq: Once | INTRAMUSCULAR | Status: AC
  Administered 2013-08-24: 4 mg via INTRAMUSCULAR
  Filled 2013-08-24: qty 1

## 2013-08-24 MED ORDER — OXYCODONE-ACETAMINOPHEN 5-325 MG PO TABS: 2.0000 | ORAL_TABLET | ORAL | Status: DC | PRN

## 2013-08-24 NOTE — ED Notes (Signed)
Pa  at bedside. 

## 2013-08-24 NOTE — ED Provider Notes (Signed)
CSN: [id number]     Arrival date & time 08/24/13  1547 History   First MD Initiated Contact with Patient 08/24/13 1656     Chief Complaint  Patient presents with  . Fall     (Consider location/radiation/quality/duration/timing/severity/associated sxs/prior Treatment) HPI Comments: The patient is a 53 year old male with a past medical history of chronic back pain, reflux, , hypertension presented to the emergency room with a chief complaint neck pain for 2 weeks. The patient reports he fell backwards 2 weeks ago and landed on a "cinderblock", he reports back pain has resolved. Persistent neck pain with radiation to right upper extremity.  Denies decrease in grip strength.  HWT:UUEKCM, OLUGBEMIGA, MD Chronic pain: PA in [place]  Patient is a 53 y.o. male presenting with fall. The history is provided by the patient. No language interpreter was used.  Fall Associated symptoms include myalgias and neck pain. Pertinent negatives include no abdominal pain, chills, fever, joint swelling, numbness or weakness.    Past Medical History  Diagnosis Date  . Hepatitis C   . GERD (gastroesophageal reflux disease)   . Back pain   . Hypertension   . Sleep apnea   . Personal history of colonic adenoma 01/20/2013   Past Surgical History  Procedure Laterality Date  . Appendectomy  1975  . Ankle surgery Right 2001  . Testicle torsion reduction  1980  . Arm surgery Left 2001   Family History  Problem Relation Age of Onset  . Colon cancer Neg Hx   . Hypertension Father     old age--20  . Other Father     enlarged prostate   History  Substance Use Topics  . Smoking status: Never Smoker   . Smokeless tobacco: Current User    Types: Snuff  . Alcohol Use: 0.0 oz/week     Comment: occ    Review of Systems  Constitutional: Negative for fever and chills.  Gastrointestinal: Negative for abdominal pain.  Musculoskeletal: Positive for back pain, myalgias, neck pain and neck stiffness. Negative  for gait problem and joint swelling.  Skin: Negative for color change and wound.  Neurological: Negative for weakness and numbness.  All other systems reviewed and are negative.     Allergies  Codeine  Home Medications   Prior to Admission medications   Medication Sig Start Date End Date Taking? Authorizing Provider  Cholecalciferol (VITAMIN D-3 PO) Take 1 tablet by mouth daily.     Historical Provider, MD  Cyanocobalamin (VITAMIN B 12 PO) Take by mouth daily.    Historical Provider, MD  cyclobenzaprine (FLEXERIL) 10 MG tablet Take 2 tablets (20 mg total) by mouth 3 (three) times daily as needed for muscle spasms. 05/21/13   Angelica Chessman, MD  doxycycline (VIBRA-TABS) 100 MG tablet Take 1 tablet (100 mg total) by mouth 2 (two) times daily. 05/21/13   Angelica Chessman, MD  gabapentin (NEURONTIN) 300 MG capsule Take 2 capsules (600 mg total) by mouth 3 (three) times daily. 05/21/13   Angelica Chessman, MD  HYDROcodone-acetaminophen (NORCO) 10-325 MG per tablet Take 1 tablet by mouth every 8 (eight) hours as needed. 11/07/11   Dot Lanes, MD  lisinopril (PRINIVIL,ZESTRIL) 20 MG tablet Take 1 tablet (20 mg total) by mouth daily. 08/04/13   Angelica Chessman, MD  Lorcaserin HCl (BELVIQ PO) Take by mouth 2 (two) times daily.    Historical Provider, MD  omeprazole (PRILOSEC) 20 MG capsule Take 1 capsule (20 mg total) by mouth daily. 05/21/13  Angelica Chessman, MD  tretinoin (RETIN-A) 0.025 % cream Apply topically at bedtime. 05/21/13   Angelica Chessman, MD   BP 156/94  Pulse 113  Temp(Src) 98.3 F (36.8 C) (Oral)  Resp 20  Ht 5' 6"  (1.676 m)  Wt 294 lb (133.358 kg)  BMI 47.48 kg/m2  SpO2 99% Physical Exam  Nursing note and vitals reviewed. Constitutional: He is oriented to person, place, and time. He appears well-developed and well-nourished. No distress.  HENT:  Head: Normocephalic and atraumatic.  Eyes: EOM are normal. Pupils are equal, round, and reactive to light.  Neck:  Normal range of motion. Neck supple. No spinous process tenderness present.  Negative Spurling's test bilaterally.  Good grip strength.   Cardiovascular: Normal rate and regular rhythm.   Pulses:      Radial pulses are 2+ on the right side, and 2+ on the left side.  Pulmonary/Chest: Effort normal. No respiratory distress.  Abdominal: Soft.  Musculoskeletal: Normal range of motion.       Right shoulder: He exhibits normal range of motion, no tenderness and no bony tenderness.       Lumbar back: He exhibits tenderness.  No midline C-spine, T-spine, or L-spine tenderness with no step-offs, crepitus, or deformities noted.  Neurological: He is alert and oriented to person, place, and time. He displays no atrophy. No sensory deficit. He exhibits normal muscle tone. Gait normal. GCS eye subscore is 4. GCS verbal subscore is 5. GCS motor subscore is 6.  Skin: Skin is warm and dry. No rash noted. He is not diaphoretic.  Psychiatric: He has a normal mood and affect. His behavior is normal.    ED Course  Procedures (including critical care time) Labs Review Labs Reviewed - No data to display  Imaging Review Dg Cervical Spine Complete  08/24/2013   CLINICAL DATA:  Posterior neck pain, fall 2 weeks ago  EXAM: CERVICAL SPINE  4+ VIEWS  COMPARISON:  Cervical spine CT dated 11/15/2011  FINDINGS: Cervical spine is visualized to C5-6 on a lateral view and C6-7 on the bilateral oblique views.  No evidence of fracture or dislocation. Vertebral body heights are maintained. Dens appears intact. Lateral masses of C1 are symmetric.  No prevertebral soft tissue swelling.  Bilateral neural foramina are patent.  Moderate multilevel degenerative changes.  Visualized lung apices are clear.  IMPRESSION: No fracture or dislocation is seen.  Moderate degenerative changes.   Electronically Signed   By: Julian Hy M.D.   On: 08/24/2013 18:13     EKG Interpretation None      MDM   Final diagnoses:  Neck pain   Chronic back pain  Fall   Pt with chronic back pain. Presents 2 weeks after fall.  No neuro deficits on exam. Pt on chronic narcotics and flexeril, discussed NSADS. Reports Naproxen in the past without adverse reactions. XR ordered.  XR shows degenerative changes.  Discussed imaging results, and treatment plan with the patient. Advises follow up with PCP, pain specialist, and ortho/neuo specialist. Return precautions given. Reports understanding and no other concerns at this time.  Patient is stable for discharge at this time.   Requesting refill of his chronic hydrocodone so he can have medication for out of town.  Discussed following up with pain doctor. Discussed stopping normal chronic narcotic and taking percocet, not doubleing up on both.   Meds given in ED:  Medications  morphine 4 MG/ML injection 4 mg (4 mg Intramuscular Given 08/24/13 1802)  ketorolac (TORADOL) 15  MG/ML injection 15 mg (15 mg Intramuscular Given 08/24/13 1802)    Discharge Medication List as of 08/24/2013  6:30 PM    START taking these medications   Details  naproxen (NAPROSYN) 500 MG tablet Take 1 tablet (500 mg total) by mouth 2 (two) times daily. Take with food, Starting 08/24/2013, Until Discontinued, Print    oxyCODONE-acetaminophen (PERCOCET/ROXICET) 5-325 MG per tablet Take 2 tablets by mouth every 4 (four) hours as needed for severe pain., Starting 08/24/2013, Until Discontinued, Print            Lorrine Kin, PA-C 08/26/13 [place], PA-C 08/26/13 1324

## 2013-08-24 NOTE — Discharge Instructions (Signed)
Call for a follow up appointment with a Family or Primary Care Provider.  Your x-ray shows chronic vertebral changes in your neck. No fracture, no dislocation. Return if Symptoms worsen.   Take medication as prescribed.  Stopped taking your hydrocodone while taking the Percocet.  These medications both contain Tylenol, and you can overdose on Tylenol. Followup with your pain specialist, and a orthopedic or back specialist. Ice UR neck 3-4 times a day for 15-20 minutes.

## 2013-08-24 NOTE — ED Notes (Signed)
Pt reports 2 weeks ago with fall while outside, slipped backwards and fell onto jagged cinder block.  Reports pain in lower lumbar area and pains in right shoulder and elbow.  States he has been icing taking otc meds without relief.

## 2013-08-25 ENCOUNTER — Telehealth: Payer: Self-pay | Admitting: Internal Medicine

## 2013-08-25 ENCOUNTER — Telehealth: Payer: Self-pay | Admitting: *Deleted

## 2013-08-25 NOTE — Telephone Encounter (Signed)
Pt has come in today to request a referral to the orthopedics; pt is also requesting a script for his pain.; please f/u with pt as he is going out of town to [facility] tomorrow;

## 2013-08-25 NOTE — Telephone Encounter (Signed)
Patient called stating he went to the ED yesterday 08/24/2013 and needs to follow up with the MD because he will need a referral to Ortho. Provided patient phone number to scheduling. Patient declined to be transferred to scheduling. Alverda Skeans, RN

## 2013-08-26 ENCOUNTER — Ambulatory Visit: Payer: No Typology Code available for payment source | Attending: Internal Medicine | Admitting: Internal Medicine

## 2013-08-26 ENCOUNTER — Encounter: Payer: Self-pay | Admitting: Internal Medicine

## 2013-08-26 VITALS — BP 122/74 | HR 116 | Temp 98.3°F | Resp 18 | Ht 66.0 in | Wt 308.8 lb

## 2013-08-26 DIAGNOSIS — Y929 Unspecified place or not applicable: Secondary | ICD-10-CM | POA: Insufficient documentation

## 2013-08-26 DIAGNOSIS — W1809XA Striking against other object with subsequent fall, initial encounter: Secondary | ICD-10-CM | POA: Insufficient documentation

## 2013-08-26 DIAGNOSIS — G8929 Other chronic pain: Secondary | ICD-10-CM | POA: Insufficient documentation

## 2013-08-26 DIAGNOSIS — M542 Cervicalgia: Secondary | ICD-10-CM

## 2013-08-26 DIAGNOSIS — S4980XA Other specified injuries of shoulder and upper arm, unspecified arm, initial encounter: Secondary | ICD-10-CM | POA: Insufficient documentation

## 2013-08-26 DIAGNOSIS — I10 Essential (primary) hypertension: Secondary | ICD-10-CM | POA: Insufficient documentation

## 2013-08-26 DIAGNOSIS — Z79899 Other long term (current) drug therapy: Secondary | ICD-10-CM | POA: Insufficient documentation

## 2013-08-26 DIAGNOSIS — Z87828 Personal history of other (healed) physical injury and trauma: Secondary | ICD-10-CM | POA: Insufficient documentation

## 2013-08-26 DIAGNOSIS — M549 Dorsalgia, unspecified: Secondary | ICD-10-CM | POA: Insufficient documentation

## 2013-08-26 DIAGNOSIS — B1920 Unspecified viral hepatitis C without hepatic coma: Secondary | ICD-10-CM | POA: Insufficient documentation

## 2013-08-26 DIAGNOSIS — M4802 Spinal stenosis, cervical region: Secondary | ICD-10-CM | POA: Insufficient documentation

## 2013-08-26 DIAGNOSIS — K219 Gastro-esophageal reflux disease without esophagitis: Secondary | ICD-10-CM | POA: Insufficient documentation

## 2013-08-26 DIAGNOSIS — S46909A Unspecified injury of unspecified muscle, fascia and tendon at shoulder and upper arm level, unspecified arm, initial encounter: Secondary | ICD-10-CM | POA: Insufficient documentation

## 2013-08-26 DIAGNOSIS — G4730 Sleep apnea, unspecified: Secondary | ICD-10-CM | POA: Insufficient documentation

## 2013-08-26 NOTE — ED Provider Notes (Signed)
Medical screening examination/treatment/procedure(s) were performed by non-physician practitioner and as supervising physician I was immediately available for consultation/collaboration.   EKG Interpretation None        Mervin Kung, MD 08/26/13 1515

## 2013-08-26 NOTE — Patient Instructions (Signed)
Back Pain, Adult Low back pain is very common. About 1 in 5 people have back pain.The cause of low back pain is rarely dangerous. The pain often gets better over time.About half of people with a sudden onset of back pain feel better in just 2 weeks. About 8 in 10 people feel better by 6 weeks.  CAUSES Some common causes of back pain include:  Strain of the muscles or ligaments supporting the spine.  Wear and tear (degeneration) of the spinal discs.  Arthritis.  Direct injury to the back. DIAGNOSIS Most of the time, the direct cause of low back pain is not known.However, back pain can be treated effectively even when the exact cause of the pain is unknown.Answering your caregiver's questions about your overall health and symptoms is one of the most accurate ways to make sure the cause of your pain is not dangerous. If your caregiver needs more information, he or she may order lab work or imaging tests (X-rays or MRIs).However, even if imaging tests show changes in your back, this usually does not require surgery. HOME CARE INSTRUCTIONS For many people, back pain returns.Since low back pain is rarely dangerous, it is often a condition that people can learn to [facility] their own.   Remain active. It is stressful on the back to sit or stand in one place. Do not sit, drive, or stand in one place for more than 30 minutes at a time. Take short walks on level surfaces as soon as pain allows.Try to increase the length of time you walk each day.  Do not stay in bed.Resting more than 1 or 2 days can delay your recovery.  Do not avoid exercise or work.Your body is made to move.It is not dangerous to be active, even though your back may hurt.Your back will likely heal faster if you return to being active before your pain is gone.  Pay attention to your body when you bend and lift. Many people have less discomfortwhen lifting if they bend their knees, keep the load close to their bodies,and  avoid twisting. Often, the most comfortable positions are those that put less stress on your recovering back.  Find a comfortable position to sleep. Use a firm mattress and lie on your side with your knees slightly bent. If you lie on your back, put a pillow under your knees.  Only take over-the-counter or prescription medicines as directed by your caregiver. Over-the-counter medicines to reduce pain and inflammation are often the most helpful.Your caregiver may prescribe muscle relaxant drugs.These medicines help dull your pain so you can more quickly return to your normal activities and healthy exercise.  Put ice on the injured area.  Put ice in a plastic bag.  Place a towel between your skin and the bag.  Leave the ice on for 15-20 minutes, 03-04 times a day for the first 2 to 3 days. After that, ice and heat may be alternated to reduce pain and spasms.  Ask your caregiver about trying back exercises and gentle massage. This may be of some benefit.  Avoid feeling anxious or stressed.Stress increases muscle tension and can worsen back pain.It is important to recognize when you are anxious or stressed and learn ways to manage it.Exercise is a great option. SEEK MEDICAL CARE IF:  You have pain that is not relieved with rest or medicine.  You have pain that does not improve in 1 week.  You have new symptoms.  You are generally not feeling well. SEEK  IMMEDIATE MEDICAL CARE IF:   You have pain that radiates from your back into your legs.  You develop new bowel or bladder control problems.  You have unusual weakness or numbness in your arms or legs.  You develop nausea or vomiting.  You develop abdominal pain.  You feel faint. Document Released: 04/09/2005 Document Revised: 10/09/2011 Document Reviewed: 08/28/2010 [facility] Patient Information 2014 [place], [place]. Exercise to Lose Weight Exercise and a healthy diet may help you lose weight. Your doctor may suggest specific  exercises. EXERCISE IDEAS AND TIPS  Choose low-cost things you enjoy doing, such as walking, bicycling, or exercising to workout videos.  Take stairs instead of the elevator.  Walk during your lunch break.  Park your car further away from work or school.  Go to a gym or an exercise class.  Start with 5 to 10 minutes of exercise each day. Build up to 30 minutes of exercise 4 to 6 days a week.  Wear shoes with good support and comfortable clothes.  Stretch before and after working out.  Work out until you breathe harder and your heart beats faster.  Drink extra water when you exercise.  Do not do so much that you hurt yourself, feel dizzy, or get very short of breath. Exercises that burn about 150 calories:  Running 1  miles in 15 minutes.  Playing volleyball for 45 to 60 minutes.  Washing and waxing a car for 45 to 60 minutes.  Playing touch football for 45 minutes.  Walking 1  miles in 35 minutes.  Pushing a stroller 1  miles in 30 minutes.  Playing basketball for 30 minutes.  Raking leaves for 30 minutes.  Bicycling 5 miles in 30 minutes.  Walking 2 miles in 30 minutes.  Dancing for 30 minutes.  Shoveling snow for 15 minutes.  Swimming laps for 20 minutes.  Walking up stairs for 15 minutes.  Bicycling 4 miles in 15 minutes.  Gardening for 30 to 45 minutes.  Jumping rope for 15 minutes.  Washing windows or floors for 45 to 60 minutes. Document Released: 05/12/2010 Document Revised: 07/02/2011 Document Reviewed: 05/12/2010 [facility] Patient Information 2014 [place], [place].
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
## 2013-08-26 NOTE — Progress Notes (Signed)
Patient here for follow-up from ED visit on 08/24/2012 for neck pain States he fell 2 weeks ago and felt back pain prior to the neck pain starting.

## 2013-09-29 ENCOUNTER — Ambulatory Visit: Payer: No Typology Code available for payment source | Admitting: Internal Medicine

## 2013-11-02 ENCOUNTER — Ambulatory Visit: Payer: Self-pay | Admitting: Pulmonary Disease

## 2013-11-05 ENCOUNTER — Encounter: Payer: Self-pay | Admitting: Internal Medicine

## 2013-11-05 ENCOUNTER — Ambulatory Visit: Payer: No Typology Code available for payment source | Attending: Internal Medicine | Admitting: Internal Medicine

## 2013-11-05 VITALS — BP 130/82 | HR 113 | Temp 99.0°F | Resp 16 | Ht 65.0 in | Wt 302.0 lb

## 2013-11-05 DIAGNOSIS — F329 Major depressive disorder, single episode, unspecified: Secondary | ICD-10-CM | POA: Insufficient documentation

## 2013-11-05 DIAGNOSIS — M549 Dorsalgia, unspecified: Secondary | ICD-10-CM

## 2013-11-05 DIAGNOSIS — I10 Essential (primary) hypertension: Secondary | ICD-10-CM | POA: Insufficient documentation

## 2013-11-05 DIAGNOSIS — G8929 Other chronic pain: Secondary | ICD-10-CM

## 2013-11-05 DIAGNOSIS — E785 Hyperlipidemia, unspecified: Secondary | ICD-10-CM | POA: Insufficient documentation

## 2013-11-05 LAB — COMPREHENSIVE METABOLIC PANEL
ALBUMIN: 3.8 g/dL (ref 3.5–5.2)
ALT: 29 U/L (ref 0–53)
AST: 18 U/L (ref 0–37)
Alkaline Phosphatase: 93 U/L (ref 39–117)
BUN: 17 mg/dL (ref 6–23)
CALCIUM: 9.2 mg/dL (ref 8.4–10.5)
CHLORIDE: 100 meq/L (ref 96–112)
CO2: 30 meq/L (ref 19–32)
Creat: 1.3 mg/dL (ref 0.50–1.35)
Glucose, Bld: 107 mg/dL — ABNORMAL HIGH (ref 70–99)
Potassium: 4.7 mEq/L (ref 3.5–5.3)
Sodium: 137 mEq/L (ref 135–145)
Total Bilirubin: 0.4 mg/dL (ref 0.2–1.2)
Total Protein: 6.9 g/dL (ref 6.0–8.3)

## 2013-11-05 LAB — LIPID PANEL
CHOL/HDL RATIO: 3.3 ratio
Cholesterol: 156 mg/dL (ref 0–200)
HDL: 47 mg/dL (ref >39)
LDL Cholesterol: 76 mg/dL (ref 0–99)
Triglycerides: 164 mg/dL — ABNORMAL HIGH (ref <150)
VLDL: 33 mg/dL (ref 0–40)

## 2013-11-05 MED ORDER — CYCLOBENZAPRINE HCL 10 MG PO TABS: 20.0000 mg | ORAL_TABLET | Freq: Three times a day (TID) | ORAL | Status: DC | PRN

## 2013-11-05 MED ORDER — NAPROXEN 500 MG PO TABS: 500.0000 mg | ORAL_TABLET | Freq: Two times a day (BID) | ORAL | Status: DC

## 2013-11-05 MED ORDER — OMEPRAZOLE 20 MG PO CPDR: 20.0000 mg | DELAYED_RELEASE_CAPSULE | Freq: Every day | ORAL | Status: DC

## 2013-11-05 MED ORDER — LORCASERIN HCL 10 MG PO TABS
10.0000 mg | ORAL_TABLET | Freq: Two times a day (BID) | ORAL | Status: DC
Start: 2013-11-05 — End: 2014-09-06

## 2013-11-05 MED ORDER — GABAPENTIN 300 MG PO CAPS: 600.0000 mg | ORAL_CAPSULE | Freq: Three times a day (TID) | ORAL | Status: DC

## 2013-11-05 MED ORDER — LISINOPRIL 20 MG PO TABS: 20.0000 mg | ORAL_TABLET | Freq: Every day | ORAL | Status: DC

## 2013-11-05 NOTE — Progress Notes (Signed)
Patient ID: Christian Guerra, male   DOB: July 16, 1960, 53 y.o.   MRN: [id number]   CC: Refill request  HPI: Patient is very pleasant 53 year old male who presents to clinic requesting refill of medications. He reports feeling well, denies chest pain or shortness of breath, no recent sicknesses or hospitalizations. He reports compliance with medications.  Allergies  Allergen Reactions  . Codeine Itching   Past Medical History  Diagnosis Date  . Hepatitis C   . GERD (gastroesophageal reflux disease)   . Back pain   . Hypertension   . Sleep apnea   . Personal history of colonic adenoma 01/20/2013  . Morbid obesity   . Chronic pain    Current Outpatient Prescriptions on File Prior to Visit  Medication Sig Dispense Refill  . Cholecalciferol (VITAMIN D-3 PO) Take 1 tablet by mouth daily.       . Cyanocobalamin (VITAMIN B 12 PO) Take by mouth daily.      Marland Kitchen doxycycline (VIBRA-TABS) 100 MG tablet Take 1 tablet (100 mg total) by mouth 2 (two) times daily.  20 tablet  5  . oxyCODONE-acetaminophen (PERCOCET/ROXICET) 5-325 MG per tablet Take 2 tablets by mouth every 4 (four) hours as needed for severe pain.  10 tablet  0  . tretinoin (RETIN-A) 0.025 % cream Apply topically at bedtime.  45 g  0   No current facility-administered medications on file prior to visit.   Family History  Problem Relation Age of Onset  . Colon cancer Neg Hx   . Hypertension Father     old age--65  . Other Father     enlarged prostate   History   Social History  . Marital Status: Single    Spouse Name: N/A    Number of Children: 0  . Years of Education: N/A   Occupational History  . unemployeed    Social History Main Topics  . Smoking status: Never Smoker   . Smokeless tobacco: Current User    Types: Snuff  . Alcohol Use: 0.0 oz/week     Comment: occ  . Drug Use: No  . Sexual Activity: Not on file   Other Topics Concern  . Not on file   Social History Narrative  . No narrative on file    Review of  Systems  Constitutional: Negative for fever, chills, diaphoresis, activity change, appetite change and fatigue.  HENT: Negative for ear pain, nosebleeds, congestion, facial swelling, rhinorrhea, neck pain, neck stiffness and ear discharge.   Eyes: Negative for pain, discharge, redness, itching and visual disturbance.  Respiratory: Negative for cough, choking, chest tightness, shortness of breath, wheezing and stridor.   Cardiovascular: Negative for chest pain, palpitations and leg swelling.  Gastrointestinal: Negative for abdominal distention.  Genitourinary: Negative for dysuria, urgency, frequency, hematuria, flank pain, decreased urine volume, difficulty urinating and dyspareunia.  Musculoskeletal: Negative for back pain, joint swelling, arthralgias and gait problem.  Neurological: Negative for dizziness, tremors, seizures, syncope, facial asymmetry, speech difficulty, weakness, light-headedness, numbness and headaches.  Hematological: Negative for adenopathy. Does not bruise/bleed easily.  Psychiatric/Behavioral: Negative for hallucinations, behavioral problems, confusion, dysphoric mood, decreased concentration and agitation.    Objective:   Filed Vitals:   11/05/13 1109  BP: 130/82  Pulse: 113  Temp: 99 F (37.2 C)  Resp: 16    Physical Exam  Constitutional: Appears well-developed and well-nourished. No distress.  HENT: Normocephalic. External right and left ear normal. Oropharynx is clear and moist.  Eyes: Conjunctivae and EOM are normal. PERRLA,  no scleral icterus.  Neck: Normal ROM. Neck supple. No JVD. No tracheal deviation. No thyromegaly.  CVS: RRR, S1/S2 +, no murmurs, no gallops, no carotid bruit.  Pulmonary: Effort and breath sounds normal, no stridor, rhonchi, wheezes, rales.  Abdominal: Soft. BS +,  no distension, tenderness, rebound or guarding.  Musculoskeletal: Normal range of motion. No edema and no tenderness.  Lymphadenopathy: No lymphadenopathy noted,  cervical, inguinal. Neuro: Alert. Normal reflexes, muscle tone coordination. No cranial nerve deficit. Skin: Skin is warm and dry. No rash noted. Not diaphoretic. No erythema. No pallor.  Psychiatric: Normal mood and affect. Behavior, judgment, thought content normal.   Lab Results  Component Value Date   WBC 10.5 12/04/2012   HGB 12.8* 12/04/2012   HCT 39.1 12/04/2012   MCV 82.8 12/04/2012   PLT 301 12/04/2012   Lab Results  Component Value Date   CREATININE 1.17 05/21/2013   BUN 16 05/21/2013   NA 139 05/21/2013   K 3.9 05/21/2013   CL 96 05/21/2013   CO2 30 05/21/2013    Lab Results  Component Value Date   HGBA1C 5.4 09/23/2012   Lipid Panel     Component Value Date/Time   CHOL 175 05/21/2013 1105   TRIG 164* 05/21/2013 1105   HDL 44 05/21/2013 1105   CHOLHDL 4.0 05/21/2013 1105   VLDL 33 05/21/2013 1105   LDLCALC 98 05/21/2013 1105       Assessment and plan:   Patient Active Problem List   Diagnosis Date Noted  . Major depression - Clinically stable, patient is very pleasant and denies any recent flareups  08/04/2013  . HTN (hypertension) 09/19/2012  - Clinically stable, we have discussed target blood pressure range, I will provide refills are requested - Check C. meds today to ensure creatinine and electrolytes are stable and within normal limits    Hyperlipidemia - Check lipid panel today

## 2013-11-05 NOTE — Progress Notes (Signed)
Pt is here following up on his chronic back pain and HTN. Pt states that he has been having an unexplained breakout on his face.

## 2013-11-05 NOTE — Patient Instructions (Signed)
Hypertension Hypertension, commonly called high blood pressure, is when the force of blood pumping through your arteries is too strong. Your arteries are the blood vessels that carry blood from your heart throughout your body. A blood pressure reading consists of a higher number over a lower number, such as 110/72. The higher number (systolic) is the pressure inside your arteries when your heart pumps. The lower number (diastolic) is the pressure inside your arteries when your heart relaxes. Ideally you want your blood pressure below 120/80. Hypertension forces your heart to work harder to pump blood. Your arteries may become narrow or stiff. Having hypertension puts you at risk for heart disease, stroke, and other problems.  RISK FACTORS Some risk factors for high blood pressure are controllable. Others are not.  Risk factors you cannot control include:   Race. You may be at higher risk if you are African American.  Age. Risk increases with age.  Gender. Men are at higher risk than women before age 64 years. After age 37, women are at higher risk than men. Risk factors you can control include:  Not getting enough exercise or physical activity.  Being overweight.  Getting too much fat, sugar, calories, or salt in your diet.  Drinking too much alcohol. SIGNS AND SYMPTOMS Hypertension does not usually cause signs or symptoms. Extremely high blood pressure (hypertensive crisis) may cause headache, anxiety, shortness of breath, and nosebleed. DIAGNOSIS  To check if you have hypertension, your health care provider will measure your blood pressure while you are seated, with your arm held at the level of your heart. It should be measured at least twice using the same arm. Certain conditions can cause a difference in blood pressure between your right and left arms. A blood pressure reading that is higher than normal on one occasion does not mean that you need treatment. If one blood pressure reading  is high, ask your health care provider about having it checked again. TREATMENT  Treating high blood pressure includes making lifestyle changes and possibly taking medication. Living a healthy lifestyle can help lower high blood pressure. You may need to change some of your habits. Lifestyle changes may include:  Following the DASH diet. This diet is high in fruits, vegetables, and whole grains. It is low in salt, red meat, and added sugars.  Getting at least 2 1/2 hours of brisk physical activity every week.  Losing weight if necessary.  Not smoking.  Limiting alcoholic beverages.  Learning ways to reduce stress. If lifestyle changes are not enough to get your blood pressure under control, your health care provider may prescribe medicine. You may need to take more than one. Work closely with your health care provider to understand the risks and benefits. HOME CARE INSTRUCTIONS  Have your blood pressure rechecked as directed by your health care provider.   Only take medicine as directed by your health care provider. Follow the directions carefully. Blood pressure medicines must be taken as prescribed. The medicine does not work as well when you skip doses. Skipping doses also puts you at risk for problems.   Do not smoke.   Monitor your blood pressure at home as directed by your health care provider. SEEK MEDICAL CARE IF:   You think you are having a reaction to medicines taken.  You have recurrent headaches or feel dizzy.  You have swelling in your ankles.  You have trouble with your vision. SEEK IMMEDIATE MEDICAL CARE IF:  You develop a severe headache or  confusion.  You have unusual weakness, numbness, or feel faint.  You have severe chest or abdominal pain.  You vomit repeatedly.  You have trouble breathing. MAKE SURE YOU:   Understand these instructions.  Will watch your condition.  Will get help right away if you are not doing well or get  worse. Document Released: 04/09/2005 Document Revised: 04/14/2013 Document Reviewed: 01/30/2013 [facility] Patient Information 2015 [place], [place]. This information is not intended to replace advice given to you by your health care provider. Make sure you discuss any questions you have with your health care provider.

## 2013-11-11 LAB — CBC WITH DIFFERENTIAL/PLATELET
HEMATOCRIT: 40.6 % (ref 39.0–52.0)
HEMOGLOBIN: 13.3 g/dL (ref 13.0–17.0)
MCH: 27.8 pg (ref 26.0–34.0)
MCHC: 32.8 g/dL (ref 30.0–36.0)
MCV: 84.9 fL (ref 78.0–100.0)
PLATELETS: 272 10*3/uL (ref 150–400)
RBC: 4.78 MIL/uL (ref 4.22–5.81)
RDW: 15.4 % (ref 11.5–15.5)
WBC: 9.9 10*3/uL (ref 4.0–10.5)

## 2013-12-01 ENCOUNTER — Ambulatory Visit: Payer: Self-pay | Attending: Internal Medicine

## 2013-12-01 ENCOUNTER — Telehealth: Payer: Self-pay | Admitting: *Deleted

## 2013-12-01 NOTE — Telephone Encounter (Signed)
Called to return patient's call regarding medications. Patient not at home. Left a message for patient to return my call.

## 2013-12-02 ENCOUNTER — Telehealth: Payer: Self-pay | Admitting: *Deleted

## 2013-12-02 NOTE — Telephone Encounter (Signed)
Patient calling about refills on gabapentin, flexeril, and omeprazole. Informed patient that he does have refills on these medications and just go the pharmacy and let them know he needs a refill. Patient verbalized understanding. Vivia Birmingham, RN

## 2013-12-21 ENCOUNTER — Ambulatory Visit: Payer: Self-pay | Admitting: Pulmonary Disease

## 2013-12-31 ENCOUNTER — Ambulatory Visit: Payer: Self-pay | Admitting: Pulmonary Disease

## 2014-01-25 ENCOUNTER — Ambulatory Visit: Payer: Self-pay | Admitting: Pulmonary Disease

## 2014-02-15 ENCOUNTER — Ambulatory Visit: Payer: Self-pay | Admitting: Pulmonary Disease

## 2014-02-25 ENCOUNTER — Telehealth: Payer: Self-pay | Admitting: Internal Medicine

## 2014-02-25 NOTE — Telephone Encounter (Signed)
Pt. Called stating that he does not have enough medication of doxycycline (VIBRA-TABS) 100 MG tablet, because the directions are wrong. Please f/u with pt.

## 2014-03-05 ENCOUNTER — Telehealth: Payer: Self-pay | Admitting: Emergency Medicine

## 2014-03-05 NOTE — Telephone Encounter (Signed)
Left message for pt to call nurse line

## 2014-03-17 ENCOUNTER — Ambulatory Visit: Payer: Self-pay | Admitting: Pulmonary Disease

## 2014-05-19 ENCOUNTER — Ambulatory Visit: Payer: Self-pay | Attending: Internal Medicine | Admitting: Internal Medicine

## 2014-05-19 ENCOUNTER — Encounter: Payer: Self-pay | Admitting: Internal Medicine

## 2014-05-19 VITALS — BP 127/82 | HR 88 | Temp 98.0°F | Resp 16 | Ht 64.75 in | Wt 309.0 lb

## 2014-05-19 DIAGNOSIS — Z791 Long term (current) use of non-steroidal anti-inflammatories (NSAID): Secondary | ICD-10-CM | POA: Insufficient documentation

## 2014-05-19 DIAGNOSIS — G4730 Sleep apnea, unspecified: Secondary | ICD-10-CM | POA: Insufficient documentation

## 2014-05-19 DIAGNOSIS — R062 Wheezing: Secondary | ICD-10-CM | POA: Insufficient documentation

## 2014-05-19 DIAGNOSIS — Z79899 Other long term (current) drug therapy: Secondary | ICD-10-CM | POA: Insufficient documentation

## 2014-05-19 DIAGNOSIS — Z86010 Personal history of colonic polyps: Secondary | ICD-10-CM | POA: Insufficient documentation

## 2014-05-19 DIAGNOSIS — M549 Dorsalgia, unspecified: Secondary | ICD-10-CM | POA: Insufficient documentation

## 2014-05-19 DIAGNOSIS — Z23 Encounter for immunization: Secondary | ICD-10-CM | POA: Insufficient documentation

## 2014-05-19 DIAGNOSIS — G8929 Other chronic pain: Secondary | ICD-10-CM | POA: Insufficient documentation

## 2014-05-19 DIAGNOSIS — K219 Gastro-esophageal reflux disease without esophagitis: Secondary | ICD-10-CM | POA: Insufficient documentation

## 2014-05-19 DIAGNOSIS — I10 Essential (primary) hypertension: Secondary | ICD-10-CM | POA: Insufficient documentation

## 2014-05-19 DIAGNOSIS — Z6841 Body Mass Index (BMI) 40.0 and over, adult: Secondary | ICD-10-CM | POA: Insufficient documentation

## 2014-05-19 MED ORDER — NAPROXEN 500 MG PO TABS: 500.0000 mg | ORAL_TABLET | Freq: Two times a day (BID) | ORAL | Status: DC

## 2014-05-19 MED ORDER — PHENTERMINE HCL 37.5 MG PO CAPS
37.5000 mg | ORAL_CAPSULE | ORAL | Status: DC
Start: 2014-05-19 — End: 2015-01-27

## 2014-05-19 NOTE — Progress Notes (Signed)
Patient ID: Christian Guerra, male   DOB: [birth year], 54 y.o.   MRN: [id number]  CC: vaccines  HPI: Christian Guerra is a 54 y.o. male here today for a follow up visit.  Patient has past medical history of Hepatitis C, GERD, HTN, and obesity.  He presents to clinic today for a influenza and pneumonia vaccine.  Patient reports that he has had cough and chest congestion for one week.  He states that is beginning to feel better.   Patient reports that he goes to see Normajean Baxter, [place] at [facility] who manages his pain and general medical concerns.  He states that he randomly comes here when he forgets to get prescriptions filled from [facility].  He is requesting a refill of naproxen and phenetermine.    Allergies  Allergen Reactions  . Codeine Itching   Past Medical History  Diagnosis Date  . Hepatitis C   . GERD (gastroesophageal reflux disease)   . Back pain   . Hypertension   . Sleep apnea   . Personal history of colonic adenoma 01/20/2013  . Morbid obesity   . Chronic pain    Current Outpatient Prescriptions on File Prior to Visit  Medication Sig Dispense Refill  . Cholecalciferol (VITAMIN D-3 PO) Take 1 tablet by mouth daily.     . Cyanocobalamin (VITAMIN B 12 PO) Take by mouth daily.    . cyclobenzaprine (FLEXERIL) 10 MG tablet Take 2 tablets (20 mg total) by mouth 3 (three) times daily as needed for muscle spasms. 90 tablet 3  . gabapentin (NEURONTIN) 300 MG capsule Take 2 capsules (600 mg total) by mouth 3 (three) times daily. 180 capsule 6  . lisinopril (PRINIVIL,ZESTRIL) 20 MG tablet Take 1 tablet (20 mg total) by mouth daily. 90 tablet 3  . naproxen (NAPROSYN) 500 MG tablet Take 1 tablet (500 mg total) by mouth 2 (two) times daily. Take with food 60 tablet 1  . omeprazole (PRILOSEC) 20 MG capsule Take 1 capsule (20 mg total) by mouth daily. 90 capsule 3  . tretinoin (RETIN-A) 0.025 % cream Apply topically at bedtime. 45 g 0  . doxycycline (VIBRA-TABS) 100 MG tablet Take  1 tablet (100 mg total) by mouth 2 (two) times daily. (Patient not taking: Reported on 05/19/2014) 20 tablet 5  . Lorcaserin HCl (BELVIQ) 10 MG TABS 10 mg by Per post-pyloric tube route 2 (two) times daily. (Patient not taking: Reported on 05/19/2014) 60 tablet 3  . oxyCODONE-acetaminophen (PERCOCET/ROXICET) 5-325 MG per tablet Take 2 tablets by mouth every 4 (four) hours as needed for severe pain. (Patient not taking: Reported on 05/19/2014) 10 tablet 0   No current facility-administered medications on file prior to visit.   Family History  Problem Relation Age of Onset  . Colon cancer Neg Hx   . Hypertension Father     old age--75  . Other Father     enlarged prostate   History   Social History  . Marital Status: Single    Spouse Name: N/A    Number of Children: 0  . Years of Education: N/A   Occupational History  . unemployeed    Social History Main Topics  . Smoking status: Never Smoker   . Smokeless tobacco: Current User    Types: Snuff  . Alcohol Use: 0.0 oz/week     Comment: occ  . Drug Use: No  . Sexual Activity: Not on file   Other Topics Concern  . Not on file  Social History Narrative    Review of Systems  Constitutional: Negative for fever and chills.  HENT: Negative for congestion (resolved) and sore throat.   Respiratory: Positive for wheezing. Negative for cough (resolved) and shortness of breath.   Cardiovascular: Negative.   Musculoskeletal: Positive for back pain.  Neurological: Negative for dizziness and headaches.     Objective:   Filed Vitals:   05/19/14 0911  BP: 127/82  Pulse: 88  Temp: 98 F (36.7 C)  Resp: 16    Physical Exam  Constitutional: He is oriented to person, place, and time.  HENT:  Right Ear: External ear normal.  Left Ear: External ear normal.  Neck: Normal range of motion.  Cardiovascular: Normal rate, regular rhythm and normal heart sounds.   Pulmonary/Chest: He has wheezes (slight wheezing in BL lobes).   Abdominal: Soft. Bowel sounds are normal.  Neurological: He is alert and oriented to person, place, and time.     Lab Results  Component Value Date   WBC 9.9 11/05/2013   HGB 13.3 11/05/2013   HCT 40.6 11/05/2013   MCV 84.9 11/05/2013   PLT 272 11/05/2013   Lab Results  Component Value Date   CREATININE 1.30 11/05/2013   BUN 17 11/05/2013   NA 137 11/05/2013   K 4.7 11/05/2013   CL 100 11/05/2013   CO2 30 11/05/2013    Lab Results  Component Value Date   HGBA1C 5.4 09/23/2012   Lipid Panel     Component Value Date/Time   CHOL 156 11/05/2013 1142   TRIG 164* 11/05/2013 1142   HDL 47 11/05/2013 1142   CHOLHDL 3.3 11/05/2013 1142   VLDL 33 11/05/2013 1142   LDLCALC 76 11/05/2013 1142       Assessment and plan:   Christian Guerra was seen today for follow-up.  Diagnoses and associated orders for this visit:  Chronic back pain - Refill naproxen (NAPROSYN) 500 MG tablet; Take 1 tablet (500 mg total) by mouth 2 (two) times daily. Take with food  Morbid obesity - Refill phentermine 37.5 MG capsule; Take 1 capsule (37.5 mg total) by mouth every morning. Explained that he will no long receive refills of this medication from this clinic without evidence of weight loss. Explained that the medication works in with diet and proper exercise. Explained that since [facility] has been managing his chronic disease and weight loss that he should continue to follow up with them for management.  Need for prophylactic vaccination and inoculation against influenza Influenza injection received.  Explained side effects and contraindications to patient. Information sheet given to patient.  Need for prophylactic vaccination against Streptococcus pneumoniae (pneumococcus) Received, side effects and contraindications explained to patient.    Patient would follow up as needed with his [facility] provider.        Chari Manning, [facility] [phone] 05/19/2014, 9:22 AM

## 2014-05-19 NOTE — Progress Notes (Signed)
Pt is here today requesting a flu and pneumonia shot. Pt has been feeling sick for about a week w/ coughing and chest congestion.

## 2014-06-10 ENCOUNTER — Ambulatory Visit: Payer: Self-pay | Attending: Internal Medicine

## 2014-07-20 ENCOUNTER — Other Ambulatory Visit: Payer: Self-pay | Admitting: Internal Medicine

## 2014-07-21 ENCOUNTER — Other Ambulatory Visit: Payer: Self-pay | Admitting: Internal Medicine

## 2014-07-21 DIAGNOSIS — H547 Unspecified visual loss: Secondary | ICD-10-CM

## 2014-09-06 ENCOUNTER — Encounter: Payer: Self-pay | Admitting: Internal Medicine

## 2014-09-06 ENCOUNTER — Ambulatory Visit: Payer: Self-pay | Attending: Internal Medicine | Admitting: Internal Medicine

## 2014-09-06 VITALS — BP 114/73 | HR 84 | Temp 98.1°F | Ht 64.0 in | Wt 304.0 lb

## 2014-09-06 DIAGNOSIS — G629 Polyneuropathy, unspecified: Secondary | ICD-10-CM

## 2014-09-06 LAB — TSH: TSH: 0.195 u[IU]/mL — AB (ref 0.350–4.500)

## 2014-09-06 LAB — BASIC METABOLIC PANEL
BUN: 23 mg/dL (ref 6–23)
CHLORIDE: 105 meq/L (ref 96–112)
CO2: 22 meq/L (ref 19–32)
CREATININE: 1.15 mg/dL (ref 0.50–1.35)
Calcium: 8.8 mg/dL (ref 8.4–10.5)
Glucose, Bld: 91 mg/dL (ref 70–99)
Potassium: 4.4 mEq/L (ref 3.5–5.3)
Sodium: 137 mEq/L (ref 135–145)

## 2014-09-06 LAB — POCT GLYCOSYLATED HEMOGLOBIN (HGB A1C): HEMOGLOBIN A1C: 5

## 2014-09-06 LAB — VITAMIN B12: Vitamin B-12: 1987 pg/mL — ABNORMAL HIGH (ref 211–911)

## 2014-09-06 NOTE — Progress Notes (Signed)
Patient ID: Christian Guerra, male   DOB: 10-09-1960, 54 y.o.   MRN: [id number]  CC: burning in feet   HPI: Christian Guerra is a 54 y.o. male here today for a follow up visit.  Patient has past medical history of Hep C, obesity, HTN, and sleep apnea. He reports that he has always been very athletic but has been picking up more weight recently. He states that his diet has not changed but he is rapidly picking up weight. He is not exercising at the moment due to his chronic back pain. He is wanting to be tested for diabetes. He has no family history of diabetes. Denies polyuria/dipsia or blurred vision. Today he has burning in his feet for the past 4 months. The burning is in the bottom of his feet and they often swell.  Patient reports that he has had memory issues for several years. He notes a head injury when he was younger. He has been to psychiatry and has been started in [place] and has noticed that it has been working well for him. He is on Gabapentin 600 mg TID and Vicodin or chronic   Patient has No headache, No chest pain, No abdominal pain - No Nausea, No Cough - SOB.  Allergies  Allergen Reactions  . Codeine Itching   Past Medical History  Diagnosis Date  . Hepatitis C   . GERD (gastroesophageal reflux disease)   . Back pain   . Hypertension   . Sleep apnea   . Personal history of colonic adenoma 01/20/2013  . Morbid obesity   . Chronic pain    Current Outpatient Prescriptions on File Prior to Visit  Medication Sig Dispense Refill  . atenolol (TENORMIN) 25 MG tablet Take 25 mg by mouth daily.    . Cholecalciferol (VITAMIN D-3 PO) Take 1 tablet by mouth daily.     . Cyanocobalamin (VITAMIN B 12 PO) Take by mouth daily.    . cyclobenzaprine (FLEXERIL) 10 MG tablet Take 2 tablets (20 mg total) by mouth 3 (three) times daily as needed for muscle spasms. 90 tablet 3  . gabapentin (NEURONTIN) 300 MG capsule Take 2 capsules (600 mg total) by mouth 3 (three) times daily. 180 capsule 6  .  HYDROcodone-acetaminophen (NORCO) 10-325 MG per tablet Take 1 tablet by mouth 3 (three) times daily.    Marland Kitchen lisinopril (PRINIVIL,ZESTRIL) 20 MG tablet Take 1 tablet (20 mg total) by mouth daily. 90 tablet 3  . minocycline (DYNACIN) 100 MG tablet Take 100 mg by mouth 2 (two) times daily.    . naproxen (NAPROSYN) 500 MG tablet Take 1 tablet (500 mg total) by mouth 2 (two) times daily. Take with food 60 tablet 1  . omeprazole (PRILOSEC) 20 MG capsule Take 1 capsule (20 mg total) by mouth daily. 90 capsule 3  . phentermine 37.5 MG capsule Take 1 capsule (37.5 mg total) by mouth every morning. 60 capsule 0  . tretinoin (RETIN-A) 0.025 % cream Apply topically at bedtime. 45 g 0   No current facility-administered medications on file prior to visit.   Family History  Problem Relation Age of Onset  . Colon cancer Neg Hx   . Hypertension Father     old age--7  . Other Father     enlarged prostate   History   Social History  . Marital Status: Single    Spouse Name: N/A  . Number of Children: 0  . Years of Education: N/A   Occupational History  .  unemployeed    Social History Main Topics  . Smoking status: Never Smoker   . Smokeless tobacco: Current User    Types: Snuff  . Alcohol Use: No     Comment: occ  . Drug Use: No  . Sexual Activity: Not on file   Other Topics Concern  . Not on file   Social History Narrative    Review of Systems: See HPI   Objective:   Filed Vitals:   09/06/14 1438  BP: 114/73  Pulse: 84  Temp: 98.1 F (36.7 C)    Physical Exam  Cardiovascular: Normal rate, regular rhythm and normal heart sounds.   Pulmonary/Chest: Effort normal and breath sounds normal.  Musculoskeletal: He exhibits edema. He exhibits no tenderness.  Neurological: No cranial nerve deficit.     Lab Results  Component Value Date   WBC 9.9 11/05/2013   HGB 13.3 11/05/2013   HCT 40.6 11/05/2013   MCV 84.9 11/05/2013   PLT 272 11/05/2013   Lab Results  Component Value  Date   CREATININE 1.30 11/05/2013   BUN 17 11/05/2013   NA 137 11/05/2013   K 4.7 11/05/2013   CL 100 11/05/2013   CO2 30 11/05/2013    Lab Results  Component Value Date   HGBA1C 5.0 09/06/2014   Lipid Panel     Component Value Date/Time   CHOL 156 11/05/2013 1142   TRIG 164* 11/05/2013 1142   HDL 47 11/05/2013 1142   CHOLHDL 3.3 11/05/2013 1142   VLDL 33 11/05/2013 1142   LDLCALC 76 11/05/2013 1142       Assessment and plan:   Kase was seen today for burning in feet.  Diagnoses and all orders for this visit:  Neuropathy Orders: -     Vitamin B12 If b12 is normal, he will continue gabapentin daily.   Severe obesity (BMI >= 40) Orders: -     HgB A1c -     TSH -     Basic Metabolic Panel   Return in about 6 months (around 03/09/2015) for Hypertension.       Chari Manning, NP-C [facility] [phone] 09/06/2014, 2:52 PM

## 2014-09-06 NOTE — Progress Notes (Signed)
Patient has had episodes of burning in both feet-he thinks it could be gout He was started on Latuda and says it is helping with his depressed mood. He would like to be tested for diabetes and thyroid disease because he has gained a lot of weight for no apparent reason

## 2014-09-06 NOTE — Patient Instructions (Signed)

## 2014-09-22 ENCOUNTER — Telehealth: Payer: Self-pay | Admitting: *Deleted

## 2014-09-22 DIAGNOSIS — E039 Hypothyroidism, unspecified: Secondary | ICD-10-CM

## 2014-09-22 NOTE — Telephone Encounter (Signed)
Spoke to patient regarding his low thyroid blood results-transferred to [facility] to make lab visit for end of June and entered future order.  Patient reports seeing a weight loss MD at a clinic who has been giving him vitamin B12 injections.  Instructed patient to stop those and explained his levels were too high.  Patient verbalized understanding.

## 2014-09-22 NOTE — Telephone Encounter (Signed)
-----   Message from Lance Bosch, NP sent at 09/20/2014 12:59 PM EDT ----- Thyroid level is slightly low will recheck in 1 month. Please place future order for TSH and have him come back.  And out of patient has been taking vitamin B-12 supplements, if so he needs to stop ASAP levels are extremely high.

## 2014-10-18 ENCOUNTER — Ambulatory Visit: Payer: Self-pay | Attending: Internal Medicine

## 2014-10-18 DIAGNOSIS — E039 Hypothyroidism, unspecified: Secondary | ICD-10-CM

## 2014-10-18 LAB — TSH: TSH: 0.941 u[IU]/mL (ref 0.350–4.500)

## 2015-01-27 ENCOUNTER — Ambulatory Visit: Payer: Self-pay | Attending: Internal Medicine | Admitting: Internal Medicine

## 2015-01-27 ENCOUNTER — Encounter: Payer: Self-pay | Admitting: Internal Medicine

## 2015-01-27 VITALS — BP 116/74 | HR 76 | Temp 97.8°F | Resp 16 | Ht 66.0 in | Wt 314.6 lb

## 2015-01-27 DIAGNOSIS — Z6841 Body Mass Index (BMI) 40.0 and over, adult: Secondary | ICD-10-CM | POA: Insufficient documentation

## 2015-01-27 DIAGNOSIS — Z79899 Other long term (current) drug therapy: Secondary | ICD-10-CM | POA: Insufficient documentation

## 2015-01-27 DIAGNOSIS — Z23 Encounter for immunization: Secondary | ICD-10-CM | POA: Insufficient documentation

## 2015-01-27 DIAGNOSIS — J029 Acute pharyngitis, unspecified: Secondary | ICD-10-CM | POA: Insufficient documentation

## 2015-01-27 DIAGNOSIS — I10 Essential (primary) hypertension: Secondary | ICD-10-CM | POA: Insufficient documentation

## 2015-01-27 LAB — POCT RAPID STREP A (OFFICE): Rapid Strep A Screen: NEGATIVE

## 2015-01-27 MED ORDER — PHENTERMINE HCL 37.5 MG PO CAPS: 37.5000 mg | ORAL_CAPSULE | ORAL | Status: DC

## 2015-01-27 MED ORDER — ATENOLOL 25 MG PO TABS: 25.0000 mg | ORAL_TABLET | Freq: Every day | ORAL | Status: DC

## 2015-01-27 MED ORDER — LISINOPRIL 20 MG PO TABS: 20.0000 mg | ORAL_TABLET | Freq: Every day | ORAL | Status: DC

## 2015-01-27 MED ORDER — LORATADINE 10 MG PO TABS: 10.0000 mg | ORAL_TABLET | Freq: Every day | ORAL | Status: DC

## 2015-01-27 NOTE — Progress Notes (Signed)
Patient complains of having a sore throat for the past week Patient also requesting a refill on his phentermine

## 2015-01-27 NOTE — Progress Notes (Signed)
Subjective:    Patient ID: Christian Guerra, male    DOB: Jan 13, 1961, 54 y.o.   MRN: [id number]  Sore Throat  This is a new problem. The current episode started in the past 7 days. The problem has been gradually improving. Neither side of throat is experiencing more pain than the other. There has been no fever. The pain is mild. Associated symptoms include congestion and coughing. Pertinent negatives include no ear pain, headaches, hoarse voice, shortness of breath or swollen glands. He has had no exposure to strep. He has tried gargles for the symptoms. The treatment provided mild relief.  Patient is also requesting a refill on his Phenteramine. He states that he lost 25 pounds a few months ago but hurt his back again and picked up all the weight again. He states that back pain limits his exercise. He would like refills on blood pressure medication.    Review of Systems  HENT: Positive for congestion. Negative for ear pain and hoarse voice.   Respiratory: Positive for cough. Negative for shortness of breath.   Neurological: Negative for headaches.   Filed Vitals:   01/27/15 1014  BP: 116/74  Pulse: 76  Temp: 97.8 F (36.6 C)  Resp: 16       Objective:   Physical Exam  Constitutional: He is oriented to person, place, and time.  Severe obesity  HENT:  Head: Normocephalic.  Right Ear: External ear normal.  Left Ear: External ear normal.  Mouth/Throat: Oropharynx is clear and moist.  Eyes: Right eye exhibits no discharge. Left eye exhibits no discharge.  Cardiovascular: Normal rate, regular rhythm and normal heart sounds.   Pulmonary/Chest: Effort normal and breath sounds normal. He has no wheezes.  Lymphadenopathy:    He has no cervical adenopathy.  Neurological: He is alert and oriented to person, place, and time.       Assessment & Plan:  Christian Guerra was seen today for sore throat.  Diagnoses and all orders for this visit:  Sore throat -     Rapid Strep A -     Throat culture  ([facility]) -     loratadine (CLARITIN) 10 MG tablet; Take 1 tablet (10 mg total) by mouth daily. Likely viral versus allergic pharyngitis. He will begin claritin and do symptom management. Return precautions given to patient.   Essential hypertension -     lisinopril (PRINIVIL,ZESTRIL) 20 MG tablet; Take 1 tablet (20 mg total) by mouth daily. -     atenolol (TENORMIN) 25 MG tablet; Take 1 tablet (25 mg total) by mouth daily. Patient blood pressure is stable and may continue on current medication.  Education on diet, exercise, and modifiable risk factors discussed. Will obtain appropriate labs as needed. Will follow up in 6 months.   Morbid obesity, unspecified obesity type ([place]) I explained to patient that the only way I would continue him on phentermine would be if he lost greater than 10 pounds at last visit. He has actually picked up 10 pounds. I have explained that this is not a long term medication and it will not magically make him lose weight without diet changes and exercise. No refills will be given at this time. Patient states that he will likely go back to his old weight loss clinic and get refills there.   Need for influenza vaccination -     Flu Vaccine QUAD 36+ mos PF IM (Fluarix & Fluzone Quad PF)  Return in about 6 months (around 07/28/2015) for Hypertension.  Lance Bosch, NP 01/28/2015 8:57 AM

## 2015-01-27 NOTE — Patient Instructions (Signed)

## 2015-01-28 LAB — CULTURE, GROUP A STREP: ORGANISM ID, BACTERIA: NORMAL

## 2015-01-31 ENCOUNTER — Telehealth: Payer: Self-pay

## 2015-01-31 NOTE — Telephone Encounter (Signed)
-----   Message from Lance Bosch, NP sent at 01/30/2015  9:04 PM EDT ----- Normal throat culture

## 2015-01-31 NOTE — Telephone Encounter (Signed)
Patient not available Left message with family member to have him return our call

## 2016-06-06 ENCOUNTER — Ambulatory Visit: Payer: Self-pay | Attending: Physician Assistant | Admitting: Physician Assistant

## 2016-06-06 ENCOUNTER — Other Ambulatory Visit: Payer: Self-pay

## 2016-06-06 ENCOUNTER — Ambulatory Visit (HOSPITAL_COMMUNITY): Payer: Self-pay | Attending: Family Medicine

## 2016-06-06 ENCOUNTER — Encounter: Payer: Self-pay | Admitting: Physician Assistant

## 2016-06-06 ENCOUNTER — Ambulatory Visit (HOSPITAL_COMMUNITY)
Admission: RE | Admit: 2016-06-06 | Discharge: 2016-06-06 | Disposition: A | Discharge status: Home / Self Care | Payer: Self-pay | Source: Ambulatory Visit | Attending: Physician Assistant | Admitting: Physician Assistant

## 2016-06-06 VITALS — BP 152/88 | HR 71 | Temp 97.9°F | Resp 24 | Wt 384.6 lb

## 2016-06-06 DIAGNOSIS — Z9119 Patient's noncompliance with other medical treatment and regimen: Secondary | ICD-10-CM | POA: Insufficient documentation

## 2016-06-06 DIAGNOSIS — R609 Edema, unspecified: Secondary | ICD-10-CM

## 2016-06-06 DIAGNOSIS — R079 Chest pain, unspecified: Secondary | ICD-10-CM | POA: Insufficient documentation

## 2016-06-06 DIAGNOSIS — I517 Cardiomegaly: Secondary | ICD-10-CM | POA: Insufficient documentation

## 2016-06-06 DIAGNOSIS — R0601 Orthopnea: Secondary | ICD-10-CM | POA: Insufficient documentation

## 2016-06-06 DIAGNOSIS — I10 Essential (primary) hypertension: Secondary | ICD-10-CM | POA: Insufficient documentation

## 2016-06-06 DIAGNOSIS — I459 Conduction disorder, unspecified: Secondary | ICD-10-CM | POA: Insufficient documentation

## 2016-06-06 DIAGNOSIS — M7989 Other specified soft tissue disorders: Secondary | ICD-10-CM | POA: Insufficient documentation

## 2016-06-06 DIAGNOSIS — R069 Unspecified abnormalities of breathing: Secondary | ICD-10-CM | POA: Insufficient documentation

## 2016-06-06 DIAGNOSIS — R1011 Right upper quadrant pain: Secondary | ICD-10-CM

## 2016-06-06 DIAGNOSIS — Q791 Other congenital malformations of diaphragm: Secondary | ICD-10-CM | POA: Insufficient documentation

## 2016-06-06 DIAGNOSIS — B1920 Unspecified viral hepatitis C without hepatic coma: Secondary | ICD-10-CM | POA: Insufficient documentation

## 2016-06-06 LAB — COMPREHENSIVE METABOLIC PANEL
ALT: 24 U/L (ref 9–46)
AST: 26 U/L (ref 10–35)
Albumin: 4.1 g/dL (ref 3.6–5.1)
Alkaline Phosphatase: 116 U/L — ABNORMAL HIGH (ref 40–115)
BILIRUBIN TOTAL: 0.8 mg/dL (ref 0.2–1.2)
BUN: 15 mg/dL (ref 7–25)
CO2: 27 mmol/L (ref 20–31)
Calcium: 9.5 mg/dL (ref 8.6–10.3)
Chloride: 95 mmol/L — ABNORMAL LOW (ref 98–110)
Creat: 1.26 mg/dL (ref 0.70–1.33)
Glucose, Bld: 118 mg/dL — ABNORMAL HIGH (ref 65–99)
Potassium: 4.5 mmol/L (ref 3.5–5.3)
Sodium: 136 mmol/L (ref 135–146)
Total Protein: 8 g/dL (ref 6.1–8.1)

## 2016-06-06 LAB — POCT URINALYSIS DIPSTICK
BILIRUBIN UA: NEGATIVE
Blood, UA: NEGATIVE
Glucose, UA: NEGATIVE
Ketones, UA: NEGATIVE
LEUKOCYTES UA: NEGATIVE
NITRITE UA: NEGATIVE
PROTEIN UA: NEGATIVE
Spec Grav, UA: 1.015
Urobilinogen, UA: 1
pH, UA: 7

## 2016-06-06 LAB — CBC WITH DIFFERENTIAL/PLATELET
BASOS ABS: 0 {cells}/uL (ref 0–200)
Basophils Relative: 0 %
EOS ABS: 135 {cells}/uL (ref 15–500)
EOS PCT: 1 %
HCT: 42.3 % (ref 38.5–50.0)
Hemoglobin: 13.9 g/dL (ref 13.2–17.1)
Lymphocytes Relative: 15 %
Lymphs Abs: 2025 cells/uL (ref 850–3900)
MCH: 27 pg (ref 27.0–33.0)
MCHC: 32.9 g/dL (ref 32.0–36.0)
MCV: 82.1 fL (ref 80.0–100.0)
MONOS PCT: 6 %
MPV: 9 fL (ref 7.5–12.5)
Monocytes Absolute: 810 cells/uL (ref 200–950)
NEUTROS ABS: 10530 {cells}/uL — AB (ref 1500–7800)
Neutrophils Relative %: 78 %
PLATELETS: 343 10*3/uL (ref 140–400)
RBC: 5.15 MIL/uL (ref 4.20–5.80)
RDW: 16.6 % — ABNORMAL HIGH (ref 11.0–15.0)
WBC: 13.5 10*3/uL — ABNORMAL HIGH (ref 3.8–10.8)

## 2016-06-06 LAB — TSH: TSH: 2.42 mIU/L (ref 0.40–4.50)

## 2016-06-06 MED ORDER — LISINOPRIL 40 MG PO TABS: 40.0000 mg | ORAL_TABLET | Freq: Every day | ORAL | 3 refills | Status: DC

## 2016-06-06 NOTE — Patient Instructions (Addendum)
We will review labs and prescribed diuretic if there is no sign of renal failure or congestive heart failure. The diuretic will help lower your blood pressure and lower extremity edema. Take your new dose of Lisinopril 23m daily. Please go to the medical supply store and buy I advise that you call the ambulance or go to the emergency department should you have worsening symptoms.  Edema Edema is an abnormal buildup of fluids in your bodytissues. Edema is somewhatdependent on gravity to pull the fluid to the lowest place in your body. That makes the condition more common in the legs and thighs (lower extremities). Painless swelling of the feet and ankles is common and becomes more likely as you get older. It is also common in looser tissues, like around your eyes. When the affected area is squeezed, the fluid may move out of that spot and leave a dent for a few moments. This dent is called pitting. What are the causes? There are many possible causes of edema. Eating too much salt and being on your feet or sitting for a long time can cause edema in your legs and ankles. Hot weather may make edema worse. Common medical causes of edema include:  Heart failure.  Liver disease.  Kidney disease.  Weak blood vessels in your legs.  Cancer.  An injury.  Pregnancy.  Some medications.  Obesity. What are the signs or symptoms? Edema is usually painless.Your skin may look swollen or shiny. How is this diagnosed? Your health care provider may be able to diagnose edema by asking about your medical history and doing a physical exam. You may need to have tests such as X-rays, an electrocardiogram, or blood tests to check for medical conditions that may cause edema. How is this treated? Edema treatment depends on the cause. If you have heart, liver, or kidney disease, you need the treatment appropriate for these conditions. General treatment may include:  Elevation of the affected body part above  the level of your heart.  Compression of the affected body part. Pressure from elastic bandages or support stockings squeezes the tissues and forces fluid back into the blood vessels. This keeps fluid from entering the tissues.  Restriction of fluid and salt intake.  Use of a water pill (diuretic). These medications are appropriate only for some types of edema. They pull fluid out of your body and make you urinate more often. This gets rid of fluid and reduces swelling, but diuretics can have side effects. Only use diuretics as directed by your health care provider. Follow these instructions at home:  Keep the affected body part above the level of your heart when you are lying down.  Do not sit still or stand for prolonged periods.  Do not put anything directly under your knees when lying down.  Do not wear constricting clothing or garters on your upper legs.  Exercise your legs to work the fluid back into your blood vessels. This may help the swelling go down.  Wear elastic bandages or support stockings to reduce ankle swelling as directed by your health care provider.  Eat a low-salt diet to reduce fluid if your health care provider recommends it.  Only take medicines as directed by your health care provider. Contact a health care provider if:  Your edema is not responding to treatment.  You have heart, liver, or kidney disease and notice symptoms of edema.  You have edema in your legs that does not improve after elevating them.  You  have sudden and unexplained weight gain. Get help right away if:  You develop shortness of breath or chest pain.  You cannot breathe when you lie down.  You develop pain, redness, or warmth in the swollen areas.  You have heart, liver, or kidney disease and suddenly get edema.  You have a fever and your symptoms suddenly get worse. This information is not intended to replace advice given to you by your health care provider. Make sure you  discuss any questions you have with your health care provider. Document Released: 04/09/2005 Document Revised: 09/15/2015 Document Reviewed: 01/30/2013 Elsevier Interactive Patient Education  2017 [employer].

## 2016-06-06 NOTE — Progress Notes (Signed)
Subjective:  Patient ID: Christian Guerra, male    DOB: [birth year]  Age: 56 y.o. MRN: [id number]  CC: Bilateral lower extremity swelling  HPI Christian Guerra is a 56 y.o. male with a medical history of Hep C, HTN, and morbid obesity that presents with an approximate 2 week hx of bilateral lower extremity edema. Edema was severe and he could not put on his shoes. Says his capillaries in his legs "burst" from so much pressure.  Edema has significantly subsided since last week with elevation of lower extremities. He has also noted some chest pain and palpitations last week (none currently). Other symptoms include orthopnea and dyspnea.  Has also felt some RUQ discomfort last week but none currently. Thinks he may have accumulated some fluid in the abdomen last week. Says the has had single episodes of these symptoms over the years possibly totaling 3-4 episodes. The swelling and symptoms have always subsided on their own with rest. Denies headache, rash, confusion, fever, chills, nausea, vomiting, or GI/GU symptoms.      Outpatient Medications Prior to Visit  Medication Sig Dispense Refill  . atenolol (TENORMIN) 25 MG tablet Take 1 tablet (25 mg total) by mouth daily. 30 tablet 5  . Cholecalciferol (VITAMIN D-3 PO) Take 1 tablet by mouth daily.     . cyclobenzaprine (FLEXERIL) 10 MG tablet Take 2 tablets (20 mg total) by mouth 3 (three) times daily as needed for muscle spasms. 90 tablet 3  . gabapentin (NEURONTIN) 300 MG capsule Take 2 capsules (600 mg total) by mouth 3 (three) times daily. 180 capsule 6  . lurasidone (LATUDA) 40 MG TABS tablet Take 40 mg by mouth daily with breakfast.    . minocycline (DYNACIN) 100 MG tablet Take 100 mg by mouth 2 (two) times daily.    . naproxen (NAPROSYN) 500 MG tablet Take 1 tablet (500 mg total) by mouth 2 (two) times daily. Take with food 60 tablet 1  . omeprazole (PRILOSEC) 20 MG capsule Take 1 capsule (20 mg total) by mouth daily. 90 capsule 3  . Cyanocobalamin  (VITAMIN B 12 PO) Take by mouth daily.    Marland Kitchen HYDROcodone-acetaminophen (NORCO) 10-325 MG per tablet Take 1 tablet by mouth 3 (three) times daily.    Marland Kitchen loratadine (CLARITIN) 10 MG tablet Take 1 tablet (10 mg total) by mouth daily. (Patient not taking: Reported on 06/06/2016) 30 tablet 2  . tretinoin (RETIN-A) 0.025 % cream Apply topically at bedtime. (Patient not taking: Reported on 06/06/2016) 45 g 0  . lisinopril (PRINIVIL,ZESTRIL) 20 MG tablet Take 1 tablet (20 mg total) by mouth daily. (Patient not taking: Reported on 06/06/2016) 90 tablet 3   No facility-administered medications prior to visit.      ROS Review of Systems  Constitutional: Positive for malaise/fatigue (mild fatigue with exertion). Negative for chills, diaphoresis, fever and weight loss.  Eyes: Negative for blurred vision.  Respiratory: Positive for shortness of breath (with exertion). Negative for cough.   Cardiovascular: Positive for chest pain (last week, not currently), palpitations (last week, not currently), orthopnea and leg swelling. Negative for PND.  Gastrointestinal: Positive for abdominal pain (RUQ, last week) and diarrhea (last week). Negative for blood in stool, nausea and vomiting.  Genitourinary: Positive for frequency. Negative for dysuria.  Musculoskeletal: Negative for back pain.  Skin: Negative for itching and rash.       Bilateral LE with capillary rupture   Neurological: Negative for headaches.    Objective:  BP (!) 152/88 (BP Location:  Left Arm, Patient Position: Sitting, Cuff Size: Normal)   Pulse 71   Temp 97.9 F (36.6 C) (Oral)   Resp (!) 24   Wt (!) 384 lb 9.6 oz (174.5 kg)   SpO2 96%   BMI 62.08 kg/m   BP/Weight 06/06/2016 01/27/2015 09/17/7822  Systolic BP 235 361 443  Diastolic BP 88 74 73  Wt. (Lbs) 384.6 314.6 304  BMI 62.08 50.8 52.16      Physical Exam  Constitutional: He is oriented to person, place, and time.  NAD, morbidly obese, polite  HENT:  Head: Normocephalic and  atraumatic.  Eyes: Conjunctivae are normal. No scleral icterus.  Neck: Normal range of motion. Thyromegaly: difficult to assess due to body habitus.  Difficult to assess for thyromegaly or nodules due to obesity  Cardiovascular: Normal rate, regular rhythm and normal heart sounds.   Lower extremity distal pulses difficult to assess due to body habitus.  Pulmonary/Chest: Effort normal and breath sounds normal.  Abdominal: Soft. Bowel sounds are normal. There is no tenderness.  Difficult to assess for abdominal mass due to body habitus. Fluid wave difficult to assess but not apparent.  Musculoskeletal: He exhibits edema (2+ pitting edema bilaterally).  Bilateral non-pitting edema of the lower extremity  Neurological: He is alert and oriented to person, place, and time.  Skin: Skin is warm and dry. No rash noted.  Psychiatric: He has a normal mood and affect. His behavior is normal.  Vitals reviewed.    Assessment & Plan:   1. Pitting edema - Brain natriuretic peptide - Comprehensive metabolic panel - CBC with Differential/Platelet - TSH - EKG 12-Lead - Urinalysis Dipstick - Compression stockings - ECHOCARDIOGRAM COMPLETE; Future - DG Chest 2 View; Future  2. Orthopnea - Brain natriuretic peptide - EKG 12-Lead - ECHOCARDIOGRAM COMPLETE; Future - DG Chest 2 View; Future  3. Chest pain, unspecified type - No current chest pain. EKG normal - TSH - EKG 12-Lead - ECHOCARDIOGRAM COMPLETE; Future  4. Hypertension, unspecified type - Noncompliant. Will increase Lisinopril to 70m. Hold diuretic for now until review of testing for liver disease or CHF. - lisinopril (PRINIVIL,ZESTRIL) 40 MG tablet; Take 1 tablet (40 mg total) by mouth daily.  Dispense: 90 tablet; Refill: 3  5. RUQ abdominal pain - previous hx of hepatitis C. Need to assess for transaminitis. - Comprehensive metabolic panel   Meds ordered this encounter  Medications  . lisinopril (PRINIVIL,ZESTRIL) 40 MG  tablet    Sig: Take 1 tablet (40 mg total) by mouth daily.    Dispense:  90 tablet    Refill:  3    Order Specific Question:   Supervising Provider    Answer:   JTresa Garter[[id number]   Follow-up: Return in about 7 days (around 06/13/2016).   RClent DemarkPA

## 2016-06-07 LAB — BRAIN NATRIURETIC PEPTIDE: Brain Natriuretic Peptide: 9.1 pg/mL (ref <100)

## 2016-06-12 ENCOUNTER — Encounter: Payer: Self-pay | Admitting: *Deleted

## 2016-06-15 ENCOUNTER — Encounter: Payer: Self-pay | Admitting: Family Medicine

## 2016-06-15 ENCOUNTER — Ambulatory Visit: Payer: Self-pay | Attending: Family Medicine | Admitting: Family Medicine

## 2016-06-15 VITALS — BP 112/72 | HR 90 | Temp 97.7°F | Resp 18 | Ht 66.0 in | Wt 387.2 lb

## 2016-06-15 DIAGNOSIS — Z8679 Personal history of other diseases of the circulatory system: Secondary | ICD-10-CM

## 2016-06-15 DIAGNOSIS — M25561 Pain in right knee: Secondary | ICD-10-CM | POA: Insufficient documentation

## 2016-06-15 DIAGNOSIS — R202 Paresthesia of skin: Secondary | ICD-10-CM | POA: Insufficient documentation

## 2016-06-15 DIAGNOSIS — M25552 Pain in left hip: Secondary | ICD-10-CM | POA: Insufficient documentation

## 2016-06-15 DIAGNOSIS — M545 Low back pain: Secondary | ICD-10-CM | POA: Insufficient documentation

## 2016-06-15 DIAGNOSIS — M791 Myalgia: Secondary | ICD-10-CM

## 2016-06-15 DIAGNOSIS — M25562 Pain in left knee: Secondary | ICD-10-CM | POA: Insufficient documentation

## 2016-06-15 DIAGNOSIS — R200 Anesthesia of skin: Secondary | ICD-10-CM

## 2016-06-15 DIAGNOSIS — M25551 Pain in right hip: Secondary | ICD-10-CM | POA: Insufficient documentation

## 2016-06-15 DIAGNOSIS — I10 Essential (primary) hypertension: Secondary | ICD-10-CM

## 2016-06-15 DIAGNOSIS — G8929 Other chronic pain: Secondary | ICD-10-CM

## 2016-06-15 DIAGNOSIS — I517 Cardiomegaly: Secondary | ICD-10-CM | POA: Insufficient documentation

## 2016-06-15 DIAGNOSIS — M7918 Myalgia, other site: Secondary | ICD-10-CM

## 2016-06-15 LAB — BASIC METABOLIC PANEL WITH GFR
BUN: 18 mg/dL (ref 7–25)
CALCIUM: 9 mg/dL (ref 8.6–10.3)
CO2: 30 mmol/L (ref 20–31)
CREATININE: 1.13 mg/dL (ref 0.70–1.33)
Chloride: 95 mmol/L — ABNORMAL LOW (ref 98–110)
GFR, EST AFRICAN AMERICAN: 84 mL/min (ref >=60)
GFR, EST NON AFRICAN AMERICAN: 73 mL/min (ref >=60)
GLUCOSE: 107 mg/dL — AB (ref 65–99)
Potassium: 4 mmol/L (ref 3.5–5.3)
Sodium: 135 mmol/L (ref 135–146)

## 2016-06-15 LAB — HEMOGLOBIN A1C
Hgb A1c MFr Bld: 5.6 % (ref <5.7)
Mean Plasma Glucose: 114 mg/dL

## 2016-06-15 MED ORDER — IBUPROFEN 400 MG PO TABS: 400.0000 mg | ORAL_TABLET | Freq: Four times a day (QID) | ORAL | 0 refills | Status: DC | PRN

## 2016-06-15 MED ORDER — GABAPENTIN 300 MG PO CAPS: 300.0000 mg | ORAL_CAPSULE | Freq: Three times a day (TID) | ORAL | 2 refills | Status: DC

## 2016-06-15 MED ORDER — IBUPROFEN 400 MG PO TABS: 600.0000 mg | ORAL_TABLET | Freq: Four times a day (QID) | ORAL | 0 refills | Status: DC | PRN

## 2016-06-15 NOTE — Patient Instructions (Addendum)
Bring log of blood pressure to next visit.    Hypertension Hypertension is another name for high blood pressure. High blood pressure forces your heart to work harder to pump blood. A blood pressure reading has two numbers, which includes a higher number over a lower number (example: 110/72). Follow these instructions at home:  Have your blood pressure rechecked by your doctor.  Only take medicine as told by your doctor. Follow the directions carefully. The medicine does not work as well if you skip doses. Skipping doses also puts you at risk for problems.  Do not smoke.  Monitor your blood pressure at home as told by your doctor. Contact a doctor if:  You think you are having a reaction to the medicine you are taking.  You have repeat headaches or feel dizzy.  You have puffiness (swelling) in your ankles.  You have trouble with your vision. Get help right away if:  You get a very bad headache and are confused.  You feel weak, numb, or faint.  You get chest or belly (abdominal) pain.  You throw up (vomit).  You cannot breathe very well. This information is not intended to replace advice given to you by your health care provider. Make sure you discuss any questions you have with your health care provider. Document Released: 09/26/2007 Document Revised: 09/15/2015 Document Reviewed: 01/30/2013 Elsevier Interactive Patient Education  2017 [employer].

## 2016-06-15 NOTE — Progress Notes (Signed)
Subjective:  Patient ID: Christian Guerra, male    DOB: 03/27/1961  Age: 56 y.o. MRN: [id number]  CC: Establish Care   HPI Christian Guerra presents for    1. Hypertension: Reports not taking his lisinopril but he is adherent with his other antihypertensive medications. He denies any chest pain or shortness of breath. Reports bilateral lower leg swelling has decreased and improved since last office visit.  2. History of cardiomegaly: Per last office visit CXR and echocardiogram ordered. Patient did not follow-up for echocardiogram. CXR showed borderline cardiomegaly. He denies any chest pain or shortness of breath.   3. Chronic musculoskeletal pain: Chronic generalized musculoskeletal pain to the lower back and bilateral knee and hip joints. Reports paresthesias of the bilateral feet. Requests addition of ibuprofen for pain. Reports desire to lose weight and eat healthy. Declines nutritionist counseling.    Outpatient Medications Prior to Visit  Medication Sig Dispense Refill  . atenolol (TENORMIN) 25 MG tablet Take 1 tablet (25 mg total) by mouth daily. 30 tablet 5  . benztropine (COGENTIN) 0.5 MG tablet Take 0.5 mg by mouth daily.    . Cholecalciferol (VITAMIN D-3 PO) Take 1 tablet by mouth daily.     . Cyanocobalamin (VITAMIN B 12 PO) Take by mouth daily.    . cyclobenzaprine (FLEXERIL) 10 MG tablet Take 2 tablets (20 mg total) by mouth 3 (three) times daily as needed for muscle spasms. 90 tablet 3  . hydrochlorothiazide (HYDRODIURIL) 25 MG tablet Take 25 mg by mouth daily.    Marland Kitchen HYDROcodone-acetaminophen (NORCO) 10-325 MG per tablet Take 1 tablet by mouth 3 (three) times daily.    . hydrOXYzine (VISTARIL) 25 MG capsule Take 25 mg by mouth 3 (three) times daily.    Marland Kitchen lurasidone (LATUDA) 40 MG TABS tablet Take 40 mg by mouth daily with breakfast.    . minocycline (DYNACIN) 100 MG tablet Take 100 mg by mouth 2 (two) times daily.    . naproxen (NAPROSYN) 500 MG tablet Take 1 tablet (500 mg  total) by mouth 2 (two) times daily. Take with food 60 tablet 1  . omeprazole (PRILOSEC) 20 MG capsule Take 1 capsule (20 mg total) by mouth daily. 90 capsule 3  . oxyCODONE (ROXICODONE) 15 MG immediate release tablet Take 15 mg by mouth every 8 (eight) hours as needed for pain.    Marland Kitchen rOPINIRole (REQUIP) 2 MG tablet Take 2 mg by mouth at bedtime.    . sertraline (ZOLOFT) 50 MG tablet Take 50 mg by mouth daily.    . traZODone (DESYREL) 100 MG tablet Take 100 mg by mouth at bedtime.    . gabapentin (NEURONTIN) 300 MG capsule Take 2 capsules (600 mg total) by mouth 3 (three) times daily. 180 capsule 6  . lisinopril (PRINIVIL,ZESTRIL) 40 MG tablet Take 1 tablet (40 mg total) by mouth daily. 90 tablet 3  . loratadine (CLARITIN) 10 MG tablet Take 1 tablet (10 mg total) by mouth daily. (Patient not taking: Reported on 06/15/2016) 30 tablet 2  . tretinoin (RETIN-A) 0.025 % cream Apply topically at bedtime. (Patient not taking: Reported on 06/15/2016) 45 g 0   No facility-administered medications prior to visit.     ROS Review of Systems  Eyes: Negative.   Respiratory: Negative.   Cardiovascular: Negative.   Gastrointestinal: Negative.   Musculoskeletal: Positive for arthralgias, back pain and myalgias.  Skin: Negative.   Neurological: Positive for numbness (and tingling of feet).   Objective:  BP 112/72 (BP Location:  Left Arm, Patient Position: Sitting, Cuff Size: Large)   Pulse 90   Temp 97.7 F (36.5 C) (Oral)   Resp 18   Ht 5' 6"  (1.676 m)   Wt (!) 387 lb 3.2 oz (175.6 kg)   SpO2 99%   BMI 62.50 kg/m   BP/Weight 06/15/2016 06/06/2016 00/06/4915  Systolic BP 915 056 979  Diastolic BP 72 88 74  Wt. (Lbs) 387.2 384.6 314.6  BMI 62.5 62.08 50.8     Physical Exam  Constitutional:  Obese  Eyes: Conjunctivae are normal. Pupils are equal, round, and reactive to light.  Neck: No JVD present.  Cardiovascular: Normal rate, regular rhythm, normal heart sounds and intact distal pulses.     Pulmonary/Chest: Effort normal and breath sounds normal.  Abdominal: Soft. Bowel sounds are normal.  Musculoskeletal:       Lumbar back: He exhibits decreased range of motion and pain.  Neurological: He has normal reflexes.  Skin: Skin is warm and dry.  Nursing note and vitals reviewed.  Assessment & Plan:   Problem List Items Addressed This Visit      Cardiovascular and Mediastinum   HTN (hypertension) (Chronic)   Relevant Orders   Microalbumin/Creatinine Ratio, Urine (Completed)    Other Visit Diagnoses    History of cardiomegaly    -  Primary   Relevant Orders   ECHOCARDIOGRAM COMPLETE   BASIC METABOLIC PANEL WITH GFR (Completed)   Musculoskeletal pain, chronic       Relevant Medications   ibuprofen (ADVIL,MOTRIN) 400 MG tablet   gabapentin (NEURONTIN) 300 MG capsule   Numbness and tingling of both feet       Relevant Medications   gabapentin (NEURONTIN) 300 MG capsule   Other Relevant Orders   Hemoglobin A1c (Completed)      Meds ordered this encounter  Medications  . DISCONTD: ibuprofen (ADVIL,MOTRIN) 400 MG tablet    Sig: Take 1.5 tablets (600 mg total) by mouth every 6 (six) hours as needed for moderate pain.    Dispense:  30 tablet    Refill:  0    Order Specific Question:   Supervising Provider    Answer:   Tresa Garter [id number]  . ibuprofen (ADVIL,MOTRIN) 400 MG tablet    Sig: Take 1 tablet (400 mg total) by mouth every 6 (six) hours as needed for moderate pain.    Dispense:  40 tablet    Refill:  0    Order Specific Question:   Supervising Provider    Answer:   Tresa Garter [id number]  . gabapentin (NEURONTIN) 300 MG capsule    Sig: Take 1 capsule (300 mg total) by mouth 3 (three) times daily.    Dispense:  90 capsule    Refill:  2    Order Specific Question:   Supervising Provider    Answer:   Tresa Garter [[id number]    Follow-up: Return in about 1 month (around 07/13/2016) for Hypertension.   Alfonse Spruce  FNP

## 2016-06-15 NOTE — Progress Notes (Signed)
Patient is here for right shoulder  Neck, & back pain for a long term  Patient stated that was here last week & did not gave him his lisinopril prescription  Patient has eaten today  Patient has taking his meds today

## 2016-06-16 LAB — MICROALBUMIN / CREATININE URINE RATIO
Creatinine, Urine: 115 mg/dL (ref 20–370)
Microalb Creat Ratio: 3 mcg/mg creat (ref <30)
Microalb, Ur: 0.4 mg/dL

## 2016-06-21 ENCOUNTER — Other Ambulatory Visit: Payer: Self-pay | Admitting: Family Medicine

## 2016-06-21 DIAGNOSIS — D72829 Elevated white blood cell count, unspecified: Secondary | ICD-10-CM

## 2016-06-21 DIAGNOSIS — I10 Essential (primary) hypertension: Secondary | ICD-10-CM

## 2016-06-22 ENCOUNTER — Ambulatory Visit (HOSPITAL_COMMUNITY)
Admission: RE | Admit: 2016-06-22 | Discharge: 2016-06-22 | Disposition: A | Discharge status: Home / Self Care | Payer: Self-pay | Source: Ambulatory Visit | Attending: Family Medicine | Admitting: Family Medicine

## 2016-06-22 DIAGNOSIS — G4733 Obstructive sleep apnea (adult) (pediatric): Secondary | ICD-10-CM | POA: Insufficient documentation

## 2016-06-22 DIAGNOSIS — Z8679 Personal history of other diseases of the circulatory system: Secondary | ICD-10-CM | POA: Insufficient documentation

## 2016-06-22 DIAGNOSIS — B1920 Unspecified viral hepatitis C without hepatic coma: Secondary | ICD-10-CM | POA: Insufficient documentation

## 2016-06-22 DIAGNOSIS — I10 Essential (primary) hypertension: Secondary | ICD-10-CM | POA: Insufficient documentation

## 2016-06-22 DIAGNOSIS — I429 Cardiomyopathy, unspecified: Secondary | ICD-10-CM | POA: Insufficient documentation

## 2016-06-22 NOTE — Progress Notes (Signed)
  Echocardiogram 2D Echocardiogram has been performed.  Christian Guerra 06/22/2016, 8:58 AM

## 2016-06-27 ENCOUNTER — Telehealth: Payer: Self-pay

## 2016-06-27 NOTE — Telephone Encounter (Signed)
-----   Message from Alfonse Spruce, [place] sent at 06/26/2016  7:51 PM EST ----- -Echocardiogram shows your heart function and ability to pump was normal. The left side of your heart is mildly thickened this can happen over time when hypertension is not controlled.  -Continue to take your medications for blood pressure control, limit your salt intake to no more than 2 to 4 mg per day, limit intake of fried foods and foods hight in saturated and trans fats, do not smoke, increase your physical activity

## 2016-06-27 NOTE — Telephone Encounter (Signed)
CMA call to inform patient about echocardiogram results  Patient Verify DOB  Patient was aware and understood

## 2016-06-28 NOTE — Telephone Encounter (Signed)
Patient verified DOB Patient is aware of kidney function and A1C being normal. Patient is also aware of WBC being elevated. Patient was scheduled for a lab recheck of CBC/LIPID on 07/02/16 at 10:00am. No further questions at this time.

## 2016-06-28 NOTE — Telephone Encounter (Signed)
-----   Message from Alfonse Spruce, [place] sent at 06/21/2016  4:31 AM EST ----- Microalbumin/creatinine ratio level was normal. This tests for protein in your urine that can indicate early signs of kidney damage.  Kidney function normal HgbA1c is  5.6 which is normal . HgbA1c gives an average of your blood sugar levels over the last 3 months. Hgb 6.5 or higher indicates diabetes. White blood cell count was elevated from previous visit recommend scheduling follow up CBC with lab to recheck. Recommend scheduling cholesterol check with lab.

## 2016-07-02 ENCOUNTER — Ambulatory Visit: Payer: Self-pay | Attending: Family Medicine

## 2016-07-02 DIAGNOSIS — I10 Essential (primary) hypertension: Secondary | ICD-10-CM | POA: Insufficient documentation

## 2016-07-02 DIAGNOSIS — D72829 Elevated white blood cell count, unspecified: Secondary | ICD-10-CM | POA: Insufficient documentation

## 2016-07-02 LAB — LIPID PANEL
Cholesterol: 139 mg/dL (ref <200)
HDL: 34 mg/dL — ABNORMAL LOW (ref >40)
LDL CALC: 73 mg/dL (ref <100)
Total CHOL/HDL Ratio: 4.1 Ratio (ref <5.0)
Triglycerides: 159 mg/dL — ABNORMAL HIGH (ref <150)
VLDL: 32 mg/dL — ABNORMAL HIGH (ref <30)

## 2016-07-02 LAB — CBC WITH DIFFERENTIAL/PLATELET
Basophils Absolute: 0 cells/uL (ref 0–200)
Basophils Relative: 0 %
EOS ABS: 214 {cells}/uL (ref 15–500)
Eosinophils Relative: 2 %
HCT: 37.5 % — ABNORMAL LOW (ref 38.5–50.0)
Hemoglobin: 12.3 g/dL — ABNORMAL LOW (ref 13.2–17.1)
LYMPHS PCT: 14 %
Lymphs Abs: 1498 cells/uL (ref 850–3900)
MCH: 26.9 pg — ABNORMAL LOW (ref 27.0–33.0)
MCHC: 32.8 g/dL (ref 32.0–36.0)
MCV: 81.9 fL (ref 80.0–100.0)
MONO ABS: 535 {cells}/uL (ref 200–950)
MPV: 9 fL (ref 7.5–12.5)
Monocytes Relative: 5 %
NEUTROS PCT: 79 %
Neutro Abs: 8453 cells/uL — ABNORMAL HIGH (ref 1500–7800)
Platelets: 300 10*3/uL (ref 140–400)
RBC: 4.58 MIL/uL (ref 4.20–5.80)
RDW: 16 % — AB (ref 11.0–15.0)
WBC: 10.7 10*3/uL (ref 3.8–10.8)

## 2016-07-02 NOTE — Progress Notes (Signed)
Patient here for lab visit  

## 2016-07-05 ENCOUNTER — Other Ambulatory Visit: Payer: Self-pay | Admitting: Family Medicine

## 2016-07-05 DIAGNOSIS — E785 Hyperlipidemia, unspecified: Secondary | ICD-10-CM

## 2016-07-06 ENCOUNTER — Telehealth: Payer: Self-pay

## 2016-07-06 NOTE — Telephone Encounter (Signed)
CMA call to go over lab results  Patient verified DOB  Patient was aware and understood

## 2016-07-06 NOTE — Telephone Encounter (Signed)
-----   Message from Alfonse Spruce, [place] sent at 07/05/2016  1:56 PM EDT ----- -Lipid levels were elevated. This can increase your risk of heart disease overtime. Start eating a diet low in saturated fat. Limit your intake of fried foods, red meats, and whole milk. Increase physical activity. Recommend lab recheck in 6 months. -Total white blood cell count is normal. When total white blood cell count is increased it can indicate infection or inflammation.  -Increase your dietary iron intake. Good sources of iron include dark green leafy vegetables, meats, beans, and iron fortified cereals.

## 2016-07-13 ENCOUNTER — Ambulatory Visit: Payer: Self-pay | Attending: Family Medicine | Admitting: Family Medicine

## 2016-07-13 ENCOUNTER — Encounter: Payer: Self-pay | Admitting: Family Medicine

## 2016-07-13 VITALS — BP 112/67 | HR 95 | Temp 98.1°F | Resp 18 | Ht 66.0 in | Wt >= 6400 oz

## 2016-07-13 DIAGNOSIS — M7989 Other specified soft tissue disorders: Secondary | ICD-10-CM | POA: Insufficient documentation

## 2016-07-13 DIAGNOSIS — R600 Localized edema: Secondary | ICD-10-CM

## 2016-07-13 DIAGNOSIS — I10 Essential (primary) hypertension: Secondary | ICD-10-CM | POA: Insufficient documentation

## 2016-07-13 DIAGNOSIS — R0602 Shortness of breath: Secondary | ICD-10-CM | POA: Insufficient documentation

## 2016-07-13 MED ORDER — LISINOPRIL 20 MG PO TABS: 20.0000 mg | ORAL_TABLET | Freq: Every day | ORAL | 0 refills | Status: DC

## 2016-07-13 MED ORDER — HYDROCHLOROTHIAZIDE 25 MG PO TABS: 25.0000 mg | ORAL_TABLET | Freq: Every day | ORAL | 0 refills | Status: DC

## 2016-07-13 NOTE — Progress Notes (Signed)
Patient is here for f/up HTN  Patient has taking his meds for today  Patient has eaten for today  Patient complains about usual pain in neck shoulder and back

## 2016-07-13 NOTE — Progress Notes (Signed)
Subjective:  Patient ID: Christian Guerra, male    DOB: [birth year]  Age: 56 y.o. MRN: [id number]  CC: Hypertension   HPI Christian Guerra presents for  HTN:  Reports being non-adherent with lisinopril for almost a month. Reports taking lisinopril 20 mg last night and again this am. Denies any CP or SOB. He does reports some bilateral lower leg swelling.   Lower leg swelling: Reports driving yesterday for 8 hours. Denies any  increased warmth, redness, or pain in the legs. Reports swelling is greater in left than right. Reports swelling has decreased since yesterday. Reports history of chronic leg swelling.   Outpatient Medications Prior to Visit  Medication Sig Dispense Refill  . atenolol (TENORMIN) 25 MG tablet Take 1 tablet (25 mg total) by mouth daily. 30 tablet 5  . benztropine (COGENTIN) 0.5 MG tablet Take 0.5 mg by mouth daily.    . Cholecalciferol (VITAMIN D-3 PO) Take 1 tablet by mouth daily.     . Cyanocobalamin (VITAMIN B 12 PO) Take by mouth daily.    . cyclobenzaprine (FLEXERIL) 10 MG tablet Take 2 tablets (20 mg total) by mouth 3 (three) times daily as needed for muscle spasms. 90 tablet 3  . gabapentin (NEURONTIN) 300 MG capsule Take 1 capsule (300 mg total) by mouth 3 (three) times daily. 90 capsule 2  . HYDROcodone-acetaminophen (NORCO) 10-325 MG per tablet Take 1 tablet by mouth 3 (three) times daily.    . hydrOXYzine (VISTARIL) 25 MG capsule Take 25 mg by mouth 3 (three) times daily.    Marland Kitchen ibuprofen (ADVIL,MOTRIN) 400 MG tablet Take 1 tablet (400 mg total) by mouth every 6 (six) hours as needed for moderate pain. 40 tablet 0  . loratadine (CLARITIN) 10 MG tablet Take 1 tablet (10 mg total) by mouth daily. (Patient not taking: Reported on 06/15/2016) 30 tablet 2  . lurasidone (LATUDA) 40 MG TABS tablet Take 40 mg by mouth daily with breakfast.    . minocycline (DYNACIN) 100 MG tablet Take 100 mg by mouth 2 (two) times daily.    . naproxen (NAPROSYN) 500 MG tablet Take 1 tablet (500  mg total) by mouth 2 (two) times daily. Take with food 60 tablet 1  . omeprazole (PRILOSEC) 20 MG capsule Take 1 capsule (20 mg total) by mouth daily. 90 capsule 3  . oxyCODONE (ROXICODONE) 15 MG immediate release tablet Take 15 mg by mouth every 8 (eight) hours as needed for pain.    Marland Kitchen rOPINIRole (REQUIP) 2 MG tablet Take 2 mg by mouth at bedtime.    . sertraline (ZOLOFT) 50 MG tablet Take 50 mg by mouth daily.    . traZODone (DESYREL) 100 MG tablet Take 100 mg by mouth at bedtime.    . tretinoin (RETIN-A) 0.025 % cream Apply topically at bedtime. (Patient not taking: Reported on 06/15/2016) 45 g 0  . hydrochlorothiazide (HYDRODIURIL) 25 MG tablet Take 25 mg by mouth daily.    Marland Kitchen lisinopril (PRINIVIL,ZESTRIL) 40 MG tablet Take 1 tablet (40 mg total) by mouth daily. 90 tablet 3   No facility-administered medications prior to visit.     ROS Review of Systems  Eyes: Negative.   Respiratory: Negative.   Cardiovascular: Negative.   Gastrointestinal: Negative.   Skin:       Leg swelling   Objective:  BP 112/67 (BP Location: Left Arm, Patient Position: Sitting, Cuff Size: Normal)   Pulse 95   Temp 98.1 F (36.7 C) (Oral)   Resp 18  Ht 5' 6"  (1.676 m)   Wt (!) 404 lb 12.8 oz (183.6 kg)   SpO2 97%   BMI 65.34 kg/m   BP/Weight 07/13/2016 06/15/2016 04/25/1592  Systolic BP 585 929 244  Diastolic BP 67 72 88  Wt. (Lbs) 404.8 387.2 384.6  BMI 65.34 62.5 62.08    Physical Exam  Neck: No JVD present.  Cardiovascular: Normal rate, regular rhythm, normal heart sounds and intact distal pulses.   Pulmonary/Chest: Effort normal and breath sounds normal.  Abdominal: Soft. Bowel sounds are normal.  Skin: Skin is warm and dry.  BLE pitting 1+.   Nursing note and vitals reviewed.   Assessment & Plan:   Problem List Items Addressed This Visit      Cardiovascular and Mediastinum   HTN (hypertension) - Primary (Chronic)   Relevant Medications   lisinopril (PRINIVIL,ZESTRIL) 20 MG tablet     hydrochlorothiazide (HYDRODIURIL) 25 MG tablet    Other Visit Diagnoses    Lower leg edema            -Given strict return precautions if leg swelling increases, leg pain, redness, warmth, or developing SOB.         -Continue taking anti-hypertensive medications.        -Reduce fluid and salt intake, elevate legs.       Meds ordered this encounter  Medications  . lisinopril (PRINIVIL,ZESTRIL) 20 MG tablet    Sig: Take 1 tablet (20 mg total) by mouth daily.    Dispense:  90 tablet    Refill:  0    Order Specific Question:   Supervising Provider    Answer:   Tresa Garter [id number]  . hydrochlorothiazide (HYDRODIURIL) 25 MG tablet    Sig: Take 1 tablet (25 mg total) by mouth daily.    Dispense:  90 tablet    Refill:  0    Order Specific Question:   Supervising Provider    Answer:   Tresa Garter [id number]    Follow-up: Return if symptoms worsen or fail to improve. Return in about 3 months (around 10/13/2016), for Hypertension.   Alfonse Spruce FNP

## 2016-07-13 NOTE — Patient Instructions (Addendum)
Edema Edema is when you have too much fluid in your body or under your skin. Edema may make your legs, feet, and ankles swell up. Swelling is also common in looser tissues, like around your eyes. This is a common condition. It gets more common as you get older. There are many possible causes of edema. Eating too much salt (sodium) and being on your feet or sitting for a long time can cause edema in your legs, feet, and ankles. Hot weather may make edema worse. Edema is usually painless. Your skin may look swollen or shiny. Follow these instructions at home:  Keep the swollen body part raised (elevated) above the level of your heart when you are sitting or lying down.  Do not sit still or stand for a long time.  Do not wear tight clothes. Do not wear garters on your upper legs.  Exercise your legs. This can help the swelling go down.  Wear elastic bandages or support stockings as told by your doctor.  Eat a low-salt (low-sodium) diet to reduce fluid as told by your doctor.  Depending on the cause of your swelling, you may need to limit how much fluid you drink (fluid restriction).  Take over-the-counter and prescription medicines only as told by your doctor. Contact a doctor if:  Treatment is not working.  You have heart, liver, or kidney disease and have symptoms of edema.  You have sudden and unexplained weight gain. Get help right away if:  You have shortness of breath or chest pain.  You cannot breathe when you lie down.  You have pain, redness, or warmth in the swollen areas.  You have heart, liver, or kidney disease and get edema all of a sudden.  You have a fever and your symptoms get worse all of a sudden. Summary  Edema is when you have too much fluid in your body or under your skin.  Edema may make your legs, feet, and ankles swell up. Swelling is also common in looser tissues, like around your eyes.  Raise (elevate) the swollen body part above the level of your  heart when you are sitting or lying down.  Follow your doctor's instructions about diet and how much fluid you can drink (fluid restriction). This information is not intended to replace advice given to you by your health care provider. Make sure you discuss any questions you have with your health care provider. Document Released: 09/26/2007 Document Revised: 04/27/2016 Document Reviewed: 04/27/2016 Elsevier Interactive Patient Education  2017 [employer]  Fluid Restriction Some health conditions may require you to restrict your fluid intake. This means that you need to limit the amount of fluid you drink each day. When you have a fluid restriction, you must carefully measure and keep track of the amount of fluid you drink. Your health care provider will identify the specific amount of fluid you are allowed each day. This amount may depend on several things, such as:  The amount of urine you produce in a day.  How much fluid you are keeping (retaining) in your body.  Your blood pressure. What is my plan? Your health care provider recommends that you limit your fluid intake to 2.5 per day. What counts toward my fluid intake? Your fluid intake includes all liquids that you drink, as well as any foods that become liquid at room temperature. The following are examples of some fluids that you will have to restrict:  Tea, coffee, soda, lemonade, milk, water, juice, sport drinks,  and nutritional supplement beverages.  Alcoholic beverages.  Cream.  Gravy.  Ice cubes.  Soup and broth. The following are examples of foods that become liquid at room temperature. These foods will also count toward your fluid intake.  Ice cream and ice milk.  Frozen yogurt and sherbet.  Frozen ice pops.  Flavored gelatin. How do I keep track of my fluid intake? Each morning, fill a jug with the amount of water that equals the amount of fluid you are allowed for the day. You can use this water as a  guideline for fluid allowance. Each time you take in any form of fluid, including ice cubes and foods that become liquid at room temperature, pour an equal amount of water out of the container. This helps you to see how much fluid you are taking in. It also helps you to see how much of your fluid intake is left for the rest of the day. The following conversions may also be helpful in measuring your fluid intake:  1 cup equals 8 oz (240 mL).   cup equals 6 oz (180 mL).  ? cup equals 5? oz (160 mL).   cup equals 4 oz (120 mL).  ? cup equals 2? oz (80 mL).   cup equals 2 oz (60 mL).  2 Tbsp equals 1 oz (30 mL). What home care instructions should I follow while restricting fluids?  Make sure that you stay within the recommended limit each day. Always measure and keep track of your fluids, as well as any foods that turn liquid at room temperature.  Use small cups and glasses and learn to sip fluids slowly.  Add a slice of fresh lemon or lemon juice to water or ice. This helps to satisfy your thirst.  Freeze fruit juice or water in an ice cube tray. Use this as part of your fluid allowance. These cubes are useful for quenching your thirst. Measure the amount of liquid in each ice cube prior to freezing so you can subtract this amount from your day's allowance when you consume each frozen cube.  Try frozen fruits between meals, such as grapes or strawberries.  Swallow your pills along with meals or soft foods, such as applesauce or mashed potatoes. This helps you to save your fluid allowance for something that you enjoy.  Weigh yourself every day. Keeping track of your daily weight can help you and your health care provider to notice as soon as possible if you are retaining too much fluid in your body.  Weigh yourself every morning after you urinate but before you eat breakfast.  Wear the same amount of clothing each time you weigh yourself.  Write down your daily weight. Give this  weight record to your health care provider. If your weight is going up, you may be retaining too much fluid. Every 2 cups (480 mL) of fluid retained in the body becomes an extra 1 lb (0.45 kg) of body weight.  Avoid salty foods. These foods make you thirsty and make fluid control more difficult.  Brush your teeth often or rinse your mouth with mouthwash to help your dry mouth. Lemon wedges, hard sour candies, chewing gum, or breath spray may also help to moisten your mouth.  Keep the temperature in your home at a cooler level. Dry air increases thirst, so keep the air in your home as humid as possible.  Avoid being out in the hot sun, which can cause you to sweat and become thirsty. What  are some signs that I may be taking in too much fluid? You may be taking in too much fluid if:  Your weight increases. Contact your health care provider if your weight increases 3 lb or more in a day or if it increases 5 lb or more in a week.  Your face, hands, legs, feet, and belly (abdomen) start to swell.  You have trouble breathing. This information is not intended to replace advice given to you by your health care provider. Make sure you discuss any questions you have with your health care provider. Document Released: 02/04/2007 Document Revised: 09/15/2015 Document Reviewed: 09/08/2013 Elsevier Interactive Patient Education  2017 [employer].

## 2016-09-06 ENCOUNTER — Other Ambulatory Visit: Payer: Self-pay | Admitting: Family Medicine

## 2016-09-06 DIAGNOSIS — I10 Essential (primary) hypertension: Secondary | ICD-10-CM

## 2016-09-10 DIAGNOSIS — S37019A Minor contusion of unspecified kidney, initial encounter: Secondary | ICD-10-CM

## 2016-09-10 HISTORY — DX: Minor contusion of unspecified kidney, initial encounter: S37.019A

## 2016-09-11 ENCOUNTER — Emergency Department (HOSPITAL_COMMUNITY): Payer: Self-pay

## 2016-09-11 ENCOUNTER — Inpatient Hospital Stay (HOSPITAL_COMMUNITY): Payer: Self-pay

## 2016-09-11 ENCOUNTER — Inpatient Hospital Stay (HOSPITAL_COMMUNITY)
Admission: EM | Admit: 2016-09-11 | Discharge: 2016-09-15 | DRG: 682 | Disposition: A | Discharge status: Home Health | Payer: Self-pay | Attending: Internal Medicine | Admitting: Internal Medicine

## 2016-09-11 ENCOUNTER — Encounter (HOSPITAL_COMMUNITY): Payer: Self-pay | Admitting: Emergency Medicine

## 2016-09-11 DIAGNOSIS — R410 Disorientation, unspecified: Secondary | ICD-10-CM

## 2016-09-11 DIAGNOSIS — W1839XA Other fall on same level, initial encounter: Secondary | ICD-10-CM | POA: Diagnosis present

## 2016-09-11 DIAGNOSIS — E875 Hyperkalemia: Secondary | ICD-10-CM | POA: Diagnosis present

## 2016-09-11 DIAGNOSIS — R062 Wheezing: Secondary | ICD-10-CM

## 2016-09-11 DIAGNOSIS — G894 Chronic pain syndrome: Secondary | ICD-10-CM | POA: Diagnosis present

## 2016-09-11 DIAGNOSIS — M545 Low back pain: Secondary | ICD-10-CM | POA: Diagnosis present

## 2016-09-11 DIAGNOSIS — E871 Hypo-osmolality and hyponatremia: Secondary | ICD-10-CM | POA: Diagnosis present

## 2016-09-11 DIAGNOSIS — F17290 Nicotine dependence, other tobacco product, uncomplicated: Secondary | ICD-10-CM | POA: Diagnosis present

## 2016-09-11 DIAGNOSIS — R0602 Shortness of breath: Secondary | ICD-10-CM

## 2016-09-11 DIAGNOSIS — G9341 Metabolic encephalopathy: Secondary | ICD-10-CM | POA: Diagnosis present

## 2016-09-11 DIAGNOSIS — I5032 Chronic diastolic (congestive) heart failure: Secondary | ICD-10-CM

## 2016-09-11 DIAGNOSIS — Z79891 Long term (current) use of opiate analgesic: Secondary | ICD-10-CM

## 2016-09-11 DIAGNOSIS — G9340 Encephalopathy, unspecified: Secondary | ICD-10-CM | POA: Diagnosis present

## 2016-09-11 DIAGNOSIS — Z79899 Other long term (current) drug therapy: Secondary | ICD-10-CM

## 2016-09-11 DIAGNOSIS — Y92002 Bathroom of unspecified non-institutional (private) residence single-family (private) house as the place of occurrence of the external cause: Secondary | ICD-10-CM

## 2016-09-11 DIAGNOSIS — M6282 Rhabdomyolysis: Secondary | ICD-10-CM | POA: Diagnosis present

## 2016-09-11 DIAGNOSIS — W19XXXA Unspecified fall, initial encounter: Secondary | ICD-10-CM

## 2016-09-11 DIAGNOSIS — I110 Hypertensive heart disease with heart failure: Secondary | ICD-10-CM | POA: Diagnosis present

## 2016-09-11 DIAGNOSIS — G4733 Obstructive sleep apnea (adult) (pediatric): Secondary | ICD-10-CM | POA: Diagnosis present

## 2016-09-11 DIAGNOSIS — Z8249 Family history of ischemic heart disease and other diseases of the circulatory system: Secondary | ICD-10-CM

## 2016-09-11 DIAGNOSIS — M549 Dorsalgia, unspecified: Secondary | ICD-10-CM

## 2016-09-11 DIAGNOSIS — N179 Acute kidney failure, unspecified: Principal | ICD-10-CM

## 2016-09-11 DIAGNOSIS — B182 Chronic viral hepatitis C: Secondary | ICD-10-CM | POA: Diagnosis present

## 2016-09-11 DIAGNOSIS — T796XXA Traumatic ischemia of muscle, initial encounter: Secondary | ICD-10-CM | POA: Diagnosis present

## 2016-09-11 DIAGNOSIS — Z6841 Body Mass Index (BMI) 40.0 and over, adult: Secondary | ICD-10-CM

## 2016-09-11 DIAGNOSIS — F411 Generalized anxiety disorder: Secondary | ICD-10-CM | POA: Diagnosis present

## 2016-09-11 DIAGNOSIS — R531 Weakness: Secondary | ICD-10-CM

## 2016-09-11 DIAGNOSIS — I10 Essential (primary) hypertension: Secondary | ICD-10-CM | POA: Diagnosis present

## 2016-09-11 DIAGNOSIS — R292 Abnormal reflex: Secondary | ICD-10-CM

## 2016-09-11 DIAGNOSIS — G253 Myoclonus: Secondary | ICD-10-CM | POA: Diagnosis present

## 2016-09-11 DIAGNOSIS — G8929 Other chronic pain: Secondary | ICD-10-CM | POA: Diagnosis present

## 2016-09-11 DIAGNOSIS — R296 Repeated falls: Secondary | ICD-10-CM | POA: Diagnosis present

## 2016-09-11 DIAGNOSIS — R200 Anesthesia of skin: Secondary | ICD-10-CM

## 2016-09-11 DIAGNOSIS — K219 Gastro-esophageal reflux disease without esophagitis: Secondary | ICD-10-CM | POA: Diagnosis present

## 2016-09-11 HISTORY — DX: Minor contusion of unspecified kidney, initial encounter: S37.019A

## 2016-09-11 HISTORY — DX: Headache: R51

## 2016-09-11 HISTORY — DX: Other chronic pain: G89.29

## 2016-09-11 HISTORY — DX: Chronic diastolic (congestive) heart failure: I50.32

## 2016-09-11 HISTORY — DX: Low back pain: M54.5

## 2016-09-11 HISTORY — DX: Anxiety disorder, unspecified: F41.9

## 2016-09-11 HISTORY — DX: Headache, unspecified: R51.9

## 2016-09-11 HISTORY — DX: Other amnesia: R41.3

## 2016-09-11 HISTORY — DX: Low back pain, unspecified: M54.50

## 2016-09-11 HISTORY — DX: Repeated falls: R29.6

## 2016-09-11 LAB — URINALYSIS, ROUTINE W REFLEX MICROSCOPIC
BILIRUBIN URINE: NEGATIVE
Bacteria, UA: NONE SEEN
Glucose, UA: NEGATIVE mg/dL
KETONES UR: NEGATIVE mg/dL
LEUKOCYTES UA: NEGATIVE
Nitrite: NEGATIVE
PH: 5 (ref 5.0–8.0)
Protein, ur: NEGATIVE mg/dL
SPECIFIC GRAVITY, URINE: 1.012 (ref 1.005–1.030)

## 2016-09-11 LAB — CBC WITH DIFFERENTIAL/PLATELET
BASOS ABS: 0 10*3/uL (ref 0.0–0.1)
BASOS PCT: 0 %
EOS PCT: 2 %
Eosinophils Absolute: 0.2 10*3/uL (ref 0.0–0.7)
HCT: 31.6 % — ABNORMAL LOW (ref 39.0–52.0)
Hemoglobin: 10 g/dL — ABNORMAL LOW (ref 13.0–17.0)
Lymphocytes Relative: 11 %
Lymphs Abs: 1.3 10*3/uL (ref 0.7–4.0)
MCH: 25.8 pg — ABNORMAL LOW (ref 26.0–34.0)
MCHC: 31.6 g/dL (ref 30.0–36.0)
MCV: 81.7 fL (ref 78.0–100.0)
MONO ABS: 0.7 10*3/uL (ref 0.1–1.0)
Monocytes Relative: 6 %
Neutro Abs: 9.7 10*3/uL — ABNORMAL HIGH (ref 1.7–7.7)
Neutrophils Relative %: 81 %
PLATELETS: 244 10*3/uL (ref 150–400)
RBC: 3.87 MIL/uL — AB (ref 4.22–5.81)
RDW: 15.8 % — AB (ref 11.5–15.5)
WBC: 11.9 10*3/uL — AB (ref 4.0–10.5)

## 2016-09-11 LAB — COMPREHENSIVE METABOLIC PANEL
ALT: 28 U/L (ref 17–63)
AST: 63 U/L — AB (ref 15–41)
Albumin: 3.1 g/dL — ABNORMAL LOW (ref 3.5–5.0)
Alkaline Phosphatase: 111 U/L (ref 38–126)
Anion gap: 10 (ref 5–15)
BUN: 82 mg/dL — AB (ref 6–20)
CHLORIDE: 100 mmol/L — AB (ref 101–111)
CO2: 22 mmol/L (ref 22–32)
Calcium: 8.2 mg/dL — ABNORMAL LOW (ref 8.9–10.3)
Creatinine, Ser: 4.3 mg/dL — ABNORMAL HIGH (ref 0.61–1.24)
GFR calc Af Amer: 16 mL/min — ABNORMAL LOW (ref >60)
GFR calc non Af Amer: 14 mL/min — ABNORMAL LOW (ref >60)
Glucose, Bld: 96 mg/dL (ref 65–99)
POTASSIUM: 5.4 mmol/L — AB (ref 3.5–5.1)
SODIUM: 132 mmol/L — AB (ref 135–145)
Total Bilirubin: 0.7 mg/dL (ref 0.3–1.2)
Total Protein: 6.7 g/dL (ref 6.5–8.1)

## 2016-09-11 LAB — RAPID URINE DRUG SCREEN, HOSP PERFORMED
Amphetamines: NOT DETECTED
BENZODIAZEPINES: NOT DETECTED
Barbiturates: NOT DETECTED
COCAINE: NOT DETECTED
Opiates: POSITIVE — AB
TETRAHYDROCANNABINOL: NOT DETECTED

## 2016-09-11 LAB — SODIUM, URINE, RANDOM: Sodium, Ur: 56 mmol/L

## 2016-09-11 LAB — CREATININE, SERUM
Creatinine, Ser: 3.53 mg/dL — ABNORMAL HIGH (ref 0.61–1.24)
GFR calc non Af Amer: 18 mL/min — ABNORMAL LOW (ref >60)
GFR, EST AFRICAN AMERICAN: 21 mL/min — AB (ref >60)

## 2016-09-11 LAB — CREATININE, URINE, RANDOM: Creatinine, Urine: 153.31 mg/dL

## 2016-09-11 LAB — AMMONIA: Ammonia: 16 umol/L (ref 9–35)

## 2016-09-11 LAB — OSMOLALITY, URINE: OSMOLALITY UR: 381 mosm/kg (ref 300–900)

## 2016-09-11 LAB — VITAMIN B12: VITAMIN B 12: 799 pg/mL (ref 180–914)

## 2016-09-11 LAB — OSMOLALITY: OSMOLALITY: 305 mosm/kg — AB (ref 275–295)

## 2016-09-11 LAB — CK
CK TOTAL: 3010 U/L — AB (ref 49–397)
Total CK: 2402 U/L — ABNORMAL HIGH (ref 49–397)

## 2016-09-11 LAB — ETHANOL

## 2016-09-11 LAB — BRAIN NATRIURETIC PEPTIDE: B Natriuretic Peptide: 96.9 pg/mL (ref 0.0–100.0)

## 2016-09-11 LAB — LACTIC ACID, PLASMA: LACTIC ACID, VENOUS: 0.8 mmol/L (ref 0.5–1.9)

## 2016-09-11 MED ORDER — ROPINIROLE HCL 1 MG PO TABS
4.0000 mg | ORAL_TABLET | Freq: Three times a day (TID) | ORAL | Status: DC
  Administered 2016-09-11 – 2016-09-15 (×11): 4 mg via ORAL
  Filled 2016-09-11 (×11): qty 4

## 2016-09-11 MED ORDER — OXYCODONE HCL 5 MG PO TABS
15.0000 mg | ORAL_TABLET | Freq: Three times a day (TID) | ORAL | Status: DC | PRN
  Administered 2016-09-11 – 2016-09-12 (×2): 15 mg via ORAL
  Filled 2016-09-11 (×2): qty 3

## 2016-09-11 MED ORDER — SODIUM POLYSTYRENE SULFONATE 15 GM/60ML PO SUSP: 30.0000 g | Freq: Once | ORAL | Status: DC

## 2016-09-11 MED ORDER — LORAZEPAM 2 MG/ML IJ SOLN
1.0000 mg | Freq: Once | INTRAMUSCULAR | Status: AC
  Administered 2016-09-12: 1 mg via INTRAVENOUS
  Filled 2016-09-11: qty 1

## 2016-09-11 MED ORDER — MINOCYCLINE HCL 100 MG PO CAPS
100.0000 mg | ORAL_CAPSULE | Freq: Three times a day (TID) | ORAL | Status: DC
  Administered 2016-09-11 – 2016-09-15 (×11): 100 mg via ORAL
  Filled 2016-09-11 (×12): qty 1

## 2016-09-11 MED ORDER — ONDANSETRON HCL 4 MG/2ML IJ SOLN: 4.0000 mg | Freq: Four times a day (QID) | INTRAMUSCULAR | Status: DC | PRN

## 2016-09-11 MED ORDER — HEPARIN SODIUM (PORCINE) 5000 UNIT/ML IJ SOLN
5000.0000 [IU] | Freq: Three times a day (TID) | INTRAMUSCULAR | Status: DC
  Administered 2016-09-11 – 2016-09-15 (×11): 5000 [IU] via SUBCUTANEOUS
  Filled 2016-09-11 (×9): qty 1

## 2016-09-11 MED ORDER — BENZTROPINE MESYLATE 1 MG PO TABS
0.5000 mg | ORAL_TABLET | Freq: Every day | ORAL | Status: DC
  Administered 2016-09-11 – 2016-09-14 (×4): 0.5 mg via ORAL
  Filled 2016-09-11 (×4): qty 1

## 2016-09-11 MED ORDER — ACETAMINOPHEN 650 MG RE SUPP: 650.0000 mg | Freq: Four times a day (QID) | RECTAL | Status: DC | PRN

## 2016-09-11 MED ORDER — PREDNISOLONE ACETATE 1 % OP SUSP
1.0000 [drp] | Freq: Four times a day (QID) | OPHTHALMIC | Status: DC
  Administered 2016-09-12 – 2016-09-13 (×5): 1 [drp] via OPHTHALMIC
  Filled 2016-09-11: qty 1

## 2016-09-11 MED ORDER — SODIUM CHLORIDE 0.9 % IV SOLN
INTRAVENOUS | Status: AC
  Administered 2016-09-11 – 2016-09-12 (×2): via INTRAVENOUS

## 2016-09-11 MED ORDER — HYDROXYZINE PAMOATE 25 MG PO CAPS: 25.0000 mg | ORAL_CAPSULE | Freq: Three times a day (TID) | ORAL | Status: DC

## 2016-09-11 MED ORDER — LURASIDONE HCL 40 MG PO TABS
120.0000 mg | ORAL_TABLET | Freq: Every day | ORAL | Status: DC
  Administered 2016-09-11 – 2016-09-15 (×5): 120 mg via ORAL
  Filled 2016-09-11 (×5): qty 3

## 2016-09-11 MED ORDER — ONDANSETRON HCL 4 MG PO TABS: 4.0000 mg | ORAL_TABLET | Freq: Four times a day (QID) | ORAL | Status: DC | PRN

## 2016-09-11 MED ORDER — CYCLOBENZAPRINE HCL 5 MG PO TABS: 15.0000 mg | ORAL_TABLET | Freq: Three times a day (TID) | ORAL | Status: DC | PRN

## 2016-09-11 MED ORDER — SODIUM CHLORIDE 0.9% FLUSH
3.0000 mL | Freq: Two times a day (BID) | INTRAVENOUS | Status: DC
  Administered 2016-09-12 – 2016-09-14 (×6): 3 mL via INTRAVENOUS

## 2016-09-11 MED ORDER — TRAZODONE HCL 50 MG PO TABS
50.0000 mg | ORAL_TABLET | Freq: Every day | ORAL | Status: DC
  Administered 2016-09-11 – 2016-09-12 (×2): 50 mg via ORAL
  Filled 2016-09-11 (×2): qty 1

## 2016-09-11 MED ORDER — ATENOLOL 50 MG PO TABS
50.0000 mg | ORAL_TABLET | Freq: Every day | ORAL | Status: DC
  Administered 2016-09-12 – 2016-09-15 (×4): 50 mg via ORAL
  Filled 2016-09-11 (×4): qty 1

## 2016-09-11 MED ORDER — ACETAMINOPHEN 325 MG PO TABS
650.0000 mg | ORAL_TABLET | Freq: Four times a day (QID) | ORAL | Status: DC | PRN
  Administered 2016-09-12: 650 mg via ORAL
  Filled 2016-09-11: qty 2

## 2016-09-11 MED ORDER — SERTRALINE HCL 50 MG PO TABS
50.0000 mg | ORAL_TABLET | Freq: Every day | ORAL | Status: DC
  Administered 2016-09-12: 50 mg via ORAL
  Filled 2016-09-11: qty 1

## 2016-09-11 MED ORDER — PANTOPRAZOLE SODIUM 40 MG PO TBEC
40.0000 mg | DELAYED_RELEASE_TABLET | Freq: Every day | ORAL | Status: DC
Start: 2016-09-11 — End: 2016-09-15
  Administered 2016-09-12 – 2016-09-15 (×4): 40 mg via ORAL
  Filled 2016-09-11 (×4): qty 1

## 2016-09-11 MED ORDER — ALBUTEROL SULFATE (2.5 MG/3ML) 0.083% IN NEBU
2.5000 mg | INHALATION_SOLUTION | Freq: Four times a day (QID) | RESPIRATORY_TRACT | Status: DC
  Administered 2016-09-11 – 2016-09-12 (×2): 2.5 mg via RESPIRATORY_TRACT
  Filled 2016-09-11 (×2): qty 3

## 2016-09-11 MED ORDER — OXYCODONE HCL 5 MG PO TABS: 15.0000 mg | ORAL_TABLET | Freq: Two times a day (BID) | ORAL | Status: DC

## 2016-09-11 NOTE — H&P (Signed)
History and Physical    Christian Guerra FYB:[id number] DOB: 02-08-1961 DOA: 09/11/2016  PCP: Alfonse Spruce, FNP Patient coming from: home  Chief Complaint: fall  HPI: Christian Guerra is a 56 y.o. male with medical history significant for hepatitis C, GERD, hypertension, obstructive sleep apnea, morbid obesity, diastolic dysfunction chronic pain presents to the emergency department chief complaint of fall. Initial evaluation reveals acute encephalopathy, acute renal failure, rhabdomyolysis, hyperkalemia.  Information is obtained from the patient and the chart noting that information from patient may be unreliable due to acute encephalopathy. Patient reports multiple falls this morning because "I could not stand up". He reports his legs were "too weak". He also reports some numbness and tingling of his lower extremities. When he fell this morning he had the back of his head is unsure if he lost consciousness. Presently complains of a headache neck pain low back pain. He denies chest pain palpitation shortness of breath. He reports a intermittent productive cough and some mild increased lower extremity edema. Reportedly patient fell 3 days ago as well but did not his head or lose consciousness. Family reports bilateral lower extremity weakness as the cause of the fall. There is been no reports of any fever chills nausea vomiting diarrhea. No reports of decreased oral intake. Patient denies dysuria hematuria frequency or urgency.   ED Course: In emergency department has a max temp 99.1 blood pressure slightly soft at the low end of normal is not hypoxic. Appears impaired. In the emergency department he receives IV fluids and is evaluated by neurology who recommended an MRI  Review of Systems: As per HPI otherwise all other systems reviewed and are negative.   Ambulatory Status: Weights with a cane and/or walker with an unsteady gait is fairly independent with ADLs lives at home his family  Past  Medical History:  Diagnosis Date  . Back pain   . Chronic pain   . Diastolic dysfunction with chronic heart failure ([place])   . GERD (gastroesophageal reflux disease)   . Hepatitis C   . Hypertension   . Morbid obesity ([place])   . Personal history of colonic adenoma 01/20/2013  . Sleep apnea     Past Surgical History:  Procedure Laterality Date  . ANKLE SURGERY Right 2001  . APPENDECTOMY  1975  . arm surgery Left 2001  . TESTICLE TORSION REDUCTION  1980    Social History   Social History  . Marital status: Single    Spouse name: N/A  . Number of children: 0  . Years of education: N/A   Occupational History  . unemployeed    Social History Main Topics  . Smoking status: Never Smoker  . Smokeless tobacco: Current User    Types: Snuff  . Alcohol use No     Comment: occ  . Drug use: No  . Sexual activity: Not on file   Other Topics Concern  . Not on file   Social History Narrative  . No narrative on file    Allergies  Allergen Reactions  . Codeine Itching    Family History  Problem Relation Age of Onset  . Hypertension Father        old age--7  . Other Father        enlarged prostate  . Colon cancer Neg Hx     Prior to Admission medications   Medication Sig Start Date End Date Taking? Authorizing Provider  atenolol (TENORMIN) 50 MG tablet Take 50 mg by mouth daily.  Yes [provider]  benztropine (COGENTIN) 0.5 MG tablet Take 0.5 mg by mouth at bedtime.    Yes [provider]  cyclobenzaprine (FLEXERIL) 10 MG tablet Take 2 tablets (20 mg total) by mouth 3 (three) times daily as needed for muscle spasms. Patient taking differently: Take 30 mg by mouth 3 (three) times daily.  11/05/13  Yes Theodis Blaze, MD  gabapentin (NEURONTIN) 800 MG tablet Take 800 mg by mouth every 8 (eight) hours.   Yes [provider]  hydrochlorothiazide (HYDRODIURIL) 25 MG tablet Take 1 tablet (25 mg total) by mouth daily. 07/13/16  Yes Hairston,  Maylon Peppers, FNP  hydrOXYzine (VISTARIL) 25 MG capsule Take 25 mg by mouth 3 (three) times daily.   Yes [provider]  lisinopril (PRINIVIL,ZESTRIL) 20 MG tablet TAKE 1 TABLET BY MOUTH ONCE DAILY 09/06/16  Yes Hairston, Mandesia R, FNP  Lurasidone HCl 120 MG TABS Take 120 mg by mouth daily.   Yes [provider]  minocycline (DYNACIN) 100 MG tablet Take 100 mg by mouth 3 (three) times daily.    Yes [provider]  naproxen (NAPROSYN) 500 MG tablet Take 1 tablet (500 mg total) by mouth 2 (two) times daily. Take with food 05/19/14  Yes Lance Bosch, NP  omeprazole (PRILOSEC) 20 MG capsule Take 1 capsule (20 mg total) by mouth daily. 11/05/13  Yes Theodis Blaze, MD  oxyCODONE (ROXICODONE) 15 MG immediate release tablet Take 15 mg by mouth 3 (three) times daily.    Yes [provider]  rOPINIRole (REQUIP) 4 MG tablet Take 4 mg by mouth 3 (three) times daily.   Yes [provider]  sertraline (ZOLOFT) 100 MG tablet Take 100 mg by mouth daily.   Yes [provider]  traZODone (DESYREL) 100 MG tablet Take 100 mg by mouth at bedtime.   Yes [provider]  triamcinolone cream (KENALOG) 0.1 % Apply 1 application topically as needed (Rash).   Yes [provider]  prednisoLONE acetate (PRED FORTE) 1 % ophthalmic suspension Place 1 drop into both eyes 4 (four) times daily.    [provider]    Physical Exam: Vitals:   09/11/16 1530 09/11/16 1600 09/11/16 1732 09/11/16 1733  BP: (!) 108/96 (!) 98/49 112/68   Pulse:    96  Resp: 13 16 14 15   Temp:      TempSrc:      SpO2:    98%     General:  Appears morbidly obese, lethargic but cooperative, does not appear uncomfortable  Eyes:  PERRL, EOMI, normal lids, iris ENT:  grossly normal hearing, lips & tongue, his membranes of his mouth are pink somewhat dry Neck:  no LAD, masses or thyromegaly Cardiovascular:  RRR, no m/r/g. 1+ LE edema with chronic venous stasis  changes, LE tender to palpation  Respiratory:  Mild increased work of breathing with conversation. Faint end-expiratory are wheeze. Respirations shallow breath sounds distant Abdomen:  soft, ntnd, NABS Skin:  no rash or induration seen on limited exam Musculoskeletal:  grossly normal tone BUE/BLE, good ROM, no bony abnormality Psychiatric:  grossly normal mood and affect, speech fluent and appropriate, AOx3 Neurologic:  CN 2-12 grossly intact, moves all extremities in coordinated fashion, sensation intact  Labs on Admission: I have personally reviewed following labs and imaging studies  CBC:  Recent Labs Lab 09/11/16 1435  WBC 11.9*  NEUTROABS 9.7*  HGB 10.0*  HCT 31.6*  MCV 81.7  PLT 244  Basic Metabolic Panel:  Recent Labs Lab 09/11/16 1435  NA 132*  K 5.4*  CL 100*  CO2 22  GLUCOSE 96  BUN 82*  CREATININE 4.30*  CALCIUM 8.2*   GFR: CrCl cannot be calculated (Unknown ideal weight.). Liver Function Tests:  Recent Labs Lab 09/11/16 1435  AST 63*  ALT 28  ALKPHOS 111  BILITOT 0.7  PROT 6.7  ALBUMIN 3.1*   No results for input(s): LIPASE, AMYLASE in the last 168 hours. No results for input(s): AMMONIA in the last 168 hours. Coagulation Profile: No results for input(s): INR, PROTIME in the last 168 hours. Cardiac Enzymes:  Recent Labs Lab 09/11/16 1435  CKTOTAL 3,010*   BNP (last 3 results) No results for input(s): PROBNP in the last 8760 hours. HbA1C: No results for input(s): HGBA1C in the last 72 hours. CBG: No results for input(s): GLUCAP in the last 168 hours. Lipid Profile: No results for input(s): CHOL, HDL, LDLCALC, TRIG, CHOLHDL, LDLDIRECT in the last 72 hours. Thyroid Function Tests: No results for input(s): TSH, T4TOTAL, FREET4, T3FREE, THYROIDAB in the last 72 hours. Anemia Panel: No results for input(s): VITAMINB12, FOLATE, FERRITIN, TIBC, IRON, RETICCTPCT in the last 72 hours. Urine analysis:    Component Value Date/Time    COLORURINE YELLOW 09/11/2016 [place] 09/11/2016 1405   LABSPEC 1.012 09/11/2016 1405   PHURINE 5.0 09/11/2016 1405   GLUCOSEU NEGATIVE 09/11/2016 1405   HGBUR MODERATE (A) 09/11/2016 1405   BILIRUBINUR NEGATIVE 09/11/2016 1405   BILIRUBINUR negative 06/06/2016 [place] 09/11/2016 1405   PROTEINUR NEGATIVE 09/11/2016 1405   UROBILINOGEN 1.0 06/06/2016 1012   UROBILINOGEN 0.2 12/04/2012 1755   NITRITE NEGATIVE 09/11/2016 1405   LEUKOCYTESUR NEGATIVE 09/11/2016 1405    Creatinine Clearance: CrCl cannot be calculated (Unknown ideal weight.).  Sepsis Labs: @LABRCNTIP (procalcitonin:4,lacticidven:4) )No results found for this or any previous visit (from the past 240 hour(s)).   Radiological Exams on Admission: Dg Lumbar Spine Complete  Result Date: 09/11/2016 CLINICAL DATA:  Recent falls with lower back pain EXAM: LUMBAR SPINE - COMPLETE 4+ VIEW COMPARISON:  None. FINDINGS: Five lumbar type vertebral bodies are well visualized. Vertebral body height is well maintained. No pars defects or anterolisthesis is seen. Mild osteophytic changes are noted at thoracolumbar junction. No soft tissue changes are seen. IMPRESSION: No acute abnormality noted. Electronically Signed   By: Inez Catalina M.D.   On: 09/11/2016 13:14   Ct Head Wo Contrast  Result Date: 09/11/2016 CLINICAL DATA:  Multiple recent falls and loss of consciousness. Posterior neck pain. History of hypertension, hepatitis-C. EXAM: CT HEAD WITHOUT CONTRAST CT CERVICAL SPINE WITHOUT CONTRAST TECHNIQUE: Multidetector CT imaging of the head and cervical spine was performed following the standard protocol without intravenous contrast. Multiplanar CT image reconstructions of the cervical spine were also generated. COMPARISON:  Cervical spine radiographs Aug 24, 2013 and CT cervical spine November 15, 2011 FINDINGS: CT HEAD FINDINGS BRAIN: No intraparenchymal hemorrhage, mass effect nor midline shift. The ventricles  and sulci are normal. No acute large vascular territory infarcts. No abnormal extra-axial fluid collections. Basal cisterns are patent. VASCULAR: Unremarkable. SKULL/SOFT TISSUES: No skull fracture. No significant soft tissue swelling. ORBITS/SINUSES: The included ocular globes and orbital contents are normal.Trace paranasal sinus mucosal thickening and scattered subcentimeter mucosal retention cyst. OTHER: None. CT CERVICAL SPINE FINDINGS Large body habitus results in overall noisy image quality. ALIGNMENT: Straightened lordosis. Vertebral bodies in alignment. SKULL BASE AND VERTEBRAE: Cervical vertebral bodies and posterior elements are  intact. Intervertebral disc heights preserved bridging ventral osteophytes C4 through C7. Segmental calcification the posterior longitudinal ligament. No destructive bony lesions. C1-2 articulation maintained. Longus coli insertional enthesopathy. SOFT TISSUES AND SPINAL CANAL: Nonacute, limited assessment due to body habitus. DISC LEVELS: Mild canal stenosis C2-3, C4-5, C5-6 and C6-7 due to ossified posterior longitudinal ligament, possible mild canal stenosis at C6-7. No significant osseous neural foraminal narrowing. UPPER CHEST: Lung apices are clear. OTHER: None. IMPRESSION: CT HEAD: Negative noncontrast CT HEAD for age. CT CERVICAL SPINE: Habitus limited examination. No acute fracture or malalignment. Findings of DISH and OPLL. Multilevel mild osseous canal stenosis without significant neural foraminal narrowing. Electronically Signed   By: Elon Alas M.D.   On: 09/11/2016 14:16   Ct Cervical Spine Wo Contrast  Result Date: 09/11/2016 CLINICAL DATA:  Multiple recent falls and loss of consciousness. Posterior neck pain. History of hypertension, hepatitis-C. EXAM: CT HEAD WITHOUT CONTRAST CT CERVICAL SPINE WITHOUT CONTRAST TECHNIQUE: Multidetector CT imaging of the head and cervical spine was performed following the standard protocol without intravenous contrast.  Multiplanar CT image reconstructions of the cervical spine were also generated. COMPARISON:  Cervical spine radiographs Aug 24, 2013 and CT cervical spine November 15, 2011 FINDINGS: CT HEAD FINDINGS BRAIN: No intraparenchymal hemorrhage, mass effect nor midline shift. The ventricles and sulci are normal. No acute large vascular territory infarcts. No abnormal extra-axial fluid collections. Basal cisterns are patent. VASCULAR: Unremarkable. SKULL/SOFT TISSUES: No skull fracture. No significant soft tissue swelling. ORBITS/SINUSES: The included ocular globes and orbital contents are normal.Trace paranasal sinus mucosal thickening and scattered subcentimeter mucosal retention cyst. OTHER: None. CT CERVICAL SPINE FINDINGS Large body habitus results in overall noisy image quality. ALIGNMENT: Straightened lordosis. Vertebral bodies in alignment. SKULL BASE AND VERTEBRAE: Cervical vertebral bodies and posterior elements are intact. Intervertebral disc heights preserved bridging ventral osteophytes C4 through C7. Segmental calcification the posterior longitudinal ligament. No destructive bony lesions. C1-2 articulation maintained. Longus coli insertional enthesopathy. SOFT TISSUES AND SPINAL CANAL: Nonacute, limited assessment due to body habitus. DISC LEVELS: Mild canal stenosis C2-3, C4-5, C5-6 and C6-7 due to ossified posterior longitudinal ligament, possible mild canal stenosis at C6-7. No significant osseous neural foraminal narrowing. UPPER CHEST: Lung apices are clear. OTHER: None. IMPRESSION: CT HEAD: Negative noncontrast CT HEAD for age. CT CERVICAL SPINE: Habitus limited examination. No acute fracture or malalignment. Findings of DISH and OPLL. Multilevel mild osseous canal stenosis without significant neural foraminal narrowing. Electronically Signed   By: Elon Alas M.D.   On: 09/11/2016 14:16    EKG: Independently reviewed. Normal sinus rhythm Low voltage, precordial leads No significant change since  last tracing  Assessment/Plan Principal Problem:   Acute encephalopathy Active Problems:   Chronic back pain   Hepatitis C   GERD (gastroesophageal reflux disease)   HTN (hypertension)   OSA (obstructive sleep apnea)   Generalized anxiety disorder   Acute kidney injury (HCC)   Diastolic dysfunction with chronic heart failure ([facility])   Rhabdomyolysis   #1. Acute encephalopathy. CT of the head without acute abnormality. No focal deficits. MRI ordered by neurology patient unable to lay still. Likely related to metabolic derangements in setting of acute renal failure and rhadomylosis -Admit to telemetry -Fairly vigorous IV fluids -Hold nephrotoxins -Obtain a renal ultrasound -Monitor urine output -Decrease sedating medications -Obtain a B-12 folate RPR  #2. Acute kidney injury.etiology uncertain. May be related to oversedation decreased oral intake with soft blood pressure. Home medications include lisinopril hydrochlorothiazide. -See #1 -IV fluids -Hold  nephrotoxins -Monitor urine output -Obtain a renal ultrasound -Nephrology consult requested  #3. Rhabdomyolysis. CK greater than 3000. Unclear how long patient on ground after his fall today. Previous falls witnessed -IV fluids as noted above -Recheck CK  #4. Hypertension. Blood pressure fairly soft in the emergency department. Home medications include atenolol. -Hold lisinopril and hydrochlorothiazide -resume atenolol tomorrow with parameters -monitor  #5. Chronic pain. Patient with a history of low back pain. Home medications include, Neurontin, Flexeril, Roxicodone IR, Naprosyn, Norco -He is in complaining of pain at point of admission -Decrease sedating medications -Physical therapy -Monitor  #6. Obstructive sleep apnea. Patient refuses C Pap according to chart.  7. Obesity bmi  Greater than 38 -nutritional consult  #8. Diastolic dysfunction. Echo in December 2017 with an EF 55-60% mild LVH grade 2 diastolic  dysfunction. I medications include hydrochlorothiazide and lisinopril as well as atenolol -Monitor intake and output -Obtain daily weights -Continue beta blocker -monitor closely given need for IV fluids.      DVT prophylaxis: heparin  Code Status: full  Family Communication: none present  Disposition Plan: home  Consults called: [place] Admission status: inpatient    Radene Gunning MD [facility]  If 7PM-7AM, please contact night-coverage [email or web address] Password [email or web address]  09/11/2016, 5:50 PM

## 2016-09-11 NOTE — ED Notes (Signed)
Patient transported to X-ray 

## 2016-09-11 NOTE — Consult Note (Signed)
Reason for Consult: Acute kidney injury Referring Physician: Linna Darner [facility])  HPI:  (History obtained primarily from patient's relative at bedside) 56 year old Caucasian man with past medical history significant for morbid obesity, diastolic dysfunction with CHF, OSA/OHS, chronic back pain/multifocal pain, hypertension, hepatitis C infection status post treatment and what appears to be normal renal function at baseline with a creatinine of 1.1 back in February, 2018. He was brought to the emergency room with increasing falls and numbness of his legs this morning. Per the patient, he had some nausea, vomiting and diarrhea over the weekend and reports have been compliantly taking his lisinopril and HCTZ. Over the past few days, has also been taking increasing amounts of ibuprofen for aches and pains along with aspirin. He denies any chest pain or shortness of breath and denies any fever or chills.  He denies any prior history of kidney injury, history of nephrolithiasis or history of recurrent UTIs except isolated events in childhood.  Past Medical History:  Diagnosis Date  . Back pain   . Chronic pain   . Diastolic dysfunction with chronic heart failure ([place])   . GERD (gastroesophageal reflux disease)   . Hepatitis C   . Hypertension   . Morbid obesity ([place])   . Personal history of colonic adenoma 01/20/2013  . Sleep apnea     Past Surgical History:  Procedure Laterality Date  . ANKLE SURGERY Right 2001  . APPENDECTOMY  1975  . arm surgery Left 2001  . TESTICLE TORSION REDUCTION  1980    Family History  Problem Relation Age of Onset  . Hypertension Father        old age--40  . Other Father        enlarged prostate  . Colon cancer Neg Hx     Social History:  reports that he has never smoked. His smokeless tobacco use includes Snuff. He reports that he does not drink alcohol or use drugs.  Allergies:  Allergies  Allergen Reactions  . Codeine Itching    Medications:   Scheduled: . albuterol  2.5 mg Nebulization Q6H  . [START ON 09/12/2016] atenolol  50 mg Oral Daily  . benztropine  0.5 mg Oral QHS  . heparin  5,000 Units Subcutaneous Q8H  . Lurasidone HCl  120 mg Oral Daily  . minocycline  100 mg Oral TID  . pantoprazole  40 mg Oral Daily  . prednisoLONE acetate  1 drop Both Eyes QID  . rOPINIRole  4 mg Oral TID  . [START ON 09/12/2016] sertraline  50 mg Oral Daily  . sodium chloride flush  3 mL Intravenous Q12H  . sodium polystyrene  30 g Oral Once  . traZODone  50 mg Oral QHS    BMP Latest Ref Rng & Units 09/11/2016 06/15/2016 06/06/2016  Glucose 65 - 99 mg/dL 96 107(H) 118(H)  BUN 6 - 20 mg/dL 82(H) 18 15  Creatinine 0.61 - 1.24 mg/dL 4.30(H) 1.13 1.26  Sodium 135 - 145 mmol/L 132(L) 135 136  Potassium 3.5 - 5.1 mmol/L 5.4(H) 4.0 4.5  Chloride 101 - 111 mmol/L 100(L) 95(L) 95(L)  CO2 22 - 32 mmol/L 22 30 27   Calcium 8.9 - 10.3 mg/dL 8.2(L) 9.0 9.5   CBC Latest Ref Rng & Units 09/11/2016 07/02/2016 06/06/2016  WBC 4.0 - 10.5 K/uL 11.9(H) 10.7 13.5(H)  Hemoglobin 13.0 - 17.0 g/dL 10.0(L) 12.3(L) 13.9  Hematocrit 39.0 - 52.0 % 31.6(L) 37.5(L) 42.3  Platelets 150 - 400 K/uL 244 300 343  Dg Lumbar Spine Complete  Result Date: 09/11/2016 CLINICAL DATA:  Recent falls with lower back pain EXAM: LUMBAR SPINE - COMPLETE 4+ VIEW COMPARISON:  None. FINDINGS: Five lumbar type vertebral bodies are well visualized. Vertebral body height is well maintained. No pars defects or anterolisthesis is seen. Mild osteophytic changes are noted at thoracolumbar junction. No soft tissue changes are seen. IMPRESSION: No acute abnormality noted. Electronically Signed   By: Inez Catalina M.D.   On: 09/11/2016 13:14   Ct Head Wo Contrast  Result Date: 09/11/2016 CLINICAL DATA:  Multiple recent falls and loss of consciousness. Posterior neck pain. History of hypertension, hepatitis-C. EXAM: CT HEAD WITHOUT CONTRAST CT CERVICAL SPINE WITHOUT CONTRAST TECHNIQUE: Multidetector  CT imaging of the head and cervical spine was performed following the standard protocol without intravenous contrast. Multiplanar CT image reconstructions of the cervical spine were also generated. COMPARISON:  Cervical spine radiographs Aug 24, 2013 and CT cervical spine November 15, 2011 FINDINGS: CT HEAD FINDINGS BRAIN: No intraparenchymal hemorrhage, mass effect nor midline shift. The ventricles and sulci are normal. No acute large vascular territory infarcts. No abnormal extra-axial fluid collections. Basal cisterns are patent. VASCULAR: Unremarkable. SKULL/SOFT TISSUES: No skull fracture. No significant soft tissue swelling. ORBITS/SINUSES: The included ocular globes and orbital contents are normal.Trace paranasal sinus mucosal thickening and scattered subcentimeter mucosal retention cyst. OTHER: None. CT CERVICAL SPINE FINDINGS Large body habitus results in overall noisy image quality. ALIGNMENT: Straightened lordosis. Vertebral bodies in alignment. SKULL BASE AND VERTEBRAE: Cervical vertebral bodies and posterior elements are intact. Intervertebral disc heights preserved bridging ventral osteophytes C4 through C7. Segmental calcification the posterior longitudinal ligament. No destructive bony lesions. C1-2 articulation maintained. Longus coli insertional enthesopathy. SOFT TISSUES AND SPINAL CANAL: Nonacute, limited assessment due to body habitus. DISC LEVELS: Mild canal stenosis C2-3, C4-5, C5-6 and C6-7 due to ossified posterior longitudinal ligament, possible mild canal stenosis at C6-7. No significant osseous neural foraminal narrowing. UPPER CHEST: Lung apices are clear. OTHER: None. IMPRESSION: CT HEAD: Negative noncontrast CT HEAD for age. CT CERVICAL SPINE: Habitus limited examination. No acute fracture or malalignment. Findings of DISH and OPLL. Multilevel mild osseous canal stenosis without significant neural foraminal narrowing. Electronically Signed   By: Elon Alas M.D.   On: 09/11/2016 14:16    Ct Cervical Spine Wo Contrast  Result Date: 09/11/2016 CLINICAL DATA:  Multiple recent falls and loss of consciousness. Posterior neck pain. History of hypertension, hepatitis-C. EXAM: CT HEAD WITHOUT CONTRAST CT CERVICAL SPINE WITHOUT CONTRAST TECHNIQUE: Multidetector CT imaging of the head and cervical spine was performed following the standard protocol without intravenous contrast. Multiplanar CT image reconstructions of the cervical spine were also generated. COMPARISON:  Cervical spine radiographs Aug 24, 2013 and CT cervical spine November 15, 2011 FINDINGS: CT HEAD FINDINGS BRAIN: No intraparenchymal hemorrhage, mass effect nor midline shift. The ventricles and sulci are normal. No acute large vascular territory infarcts. No abnormal extra-axial fluid collections. Basal cisterns are patent. VASCULAR: Unremarkable. SKULL/SOFT TISSUES: No skull fracture. No significant soft tissue swelling. ORBITS/SINUSES: The included ocular globes and orbital contents are normal.Trace paranasal sinus mucosal thickening and scattered subcentimeter mucosal retention cyst. OTHER: None. CT CERVICAL SPINE FINDINGS Large body habitus results in overall noisy image quality. ALIGNMENT: Straightened lordosis. Vertebral bodies in alignment. SKULL BASE AND VERTEBRAE: Cervical vertebral bodies and posterior elements are intact. Intervertebral disc heights preserved bridging ventral osteophytes C4 through C7. Segmental calcification the posterior longitudinal ligament. No destructive bony lesions. C1-2 articulation maintained. Longus coli insertional enthesopathy.  SOFT TISSUES AND SPINAL CANAL: Nonacute, limited assessment due to body habitus. DISC LEVELS: Mild canal stenosis C2-3, C4-5, C5-6 and C6-7 due to ossified posterior longitudinal ligament, possible mild canal stenosis at C6-7. No significant osseous neural foraminal narrowing. UPPER CHEST: Lung apices are clear. OTHER: None. IMPRESSION: CT HEAD: Negative noncontrast CT HEAD for  age. CT CERVICAL SPINE: Habitus limited examination. No acute fracture or malalignment. Findings of DISH and OPLL. Multilevel mild osseous canal stenosis without significant neural foraminal narrowing. Electronically Signed   By: Elon Alas M.D.   On: 09/11/2016 14:16    Review of Systems  Constitutional: Positive for malaise/fatigue. Negative for fever.  HENT: Negative.   Eyes: Negative.   Respiratory: Negative.   Cardiovascular: Negative.   Gastrointestinal: Positive for diarrhea, nausea and vomiting.  Genitourinary: Negative.   Musculoskeletal: Positive for back pain and falls.  Skin: Positive for rash.       Skin rash over legs  Neurological: Positive for dizziness and weakness.   Blood pressure 112/68, pulse 96, temperature 99.1 F (37.3 C), temperature source Oral, resp. rate 15, SpO2 98 %. Physical Exam  Nursing note and vitals reviewed. Constitutional: He appears well-developed. No distress.  HENT:  Head: Normocephalic and atraumatic.  Right Ear: External ear normal.  Dry oral mucosa/lips  Eyes: Conjunctivae and EOM are normal. Pupils are equal, round, and reactive to light. No scleral icterus.  Neck: Normal range of motion. No JVD present. No thyromegaly present.  Cardiovascular: Normal rate, regular rhythm and normal heart sounds.  Exam reveals no friction rub.   No murmur heard. Respiratory: Effort normal and breath sounds normal. He has no wheezes. He has no rales.  GI: Soft. Bowel sounds are normal. He exhibits distension. There is no tenderness. There is no rebound and no guarding.  Musculoskeletal: Normal range of motion. He exhibits edema.  Trace to 1+ ankle edema  Neurological:  Confused and intermittently somnolent  Skin: Skin is warm and dry. Rash noted.  Bilateral lower extremity-bluish pigmented ?Old purpura.      Assessment/Plan: 1. Acute kidney injury: History, timeline of events and available database including relative hypotension and physical  exam findings suggestive of intravascular volume contraction (probably amplified by ongoing ACE inhibitor/thiazide use in the face of NSAID use) that likely led to his acute kidney injury. Urine analysis does not show findings pointing towards glomerulonephritis and screening for this will be deferred at this time. I will check urine electrolytes including urine sodium, creatinine and UN to corroborate suspicion of prerenal azotemia. Start isotonic intravenous fluids and obtain renal ultrasound. 2. Encephalopathy: Suspected to be pharmacologically induced and further amplified by acute kidney injury-old offensive agents including cyclobenzaprine and gabapentin at this time. Attempt forced diuresis with intravenous fluids. 3. Hyperkalemia: Mild secondary to acute kidney injury and likely on account of ongoing ACE inhibitor-status post Kayexalate in the emergency room, monitor with intravenous fluids. 4. Hyponatremia: Secondary to free water excretion defect in acute kidney injury-monitor with isotonic fluids. 5. History of diastolic heart failure: Continue close monitoring for any signs of tipping him into volume overload with ongoing intravenous fluids.  Cydnie Deason K. 09/11/2016, 6:10 PM

## 2016-09-11 NOTE — Consult Note (Signed)
NEURO HOSPITALIST CONSULT NOTE   Requestig physician: Dr. Billy Fischer   Reason for Consult: Confusion   History obtained from:  Patient     HPI:                                                                                                                                          Christian Guerra is an 56 y.o. male who is brought to the hospital secondary to a fall and confusion. Much of the history is hard to obtain as the patient is confused at this time and cannot remember the specifics of what happened. Patient states that approximately 4 days ago he was at [facility] he had been sitting down for quite a while when he stood up he felt as though his legs were numb and he was going to faint and then fell to the ground passing out and hitting his head. He states he did hit his head and he did lose consciousness for a short period of time. He cannot give me very good specifics on this but does feel that he hit his neck. Today he was in his bathroom when he stood up again he felt dizzy, felt as though his legs gave out from underneath him and is now complaining of bilateral tingling in his fingers and hand. He is very confused and cannot give a good history. It is for this reason that neurology was consultative.  Pertinent labs: Sodium 132, potassium 5.4, creatinine 4.3, calcium 8.2, white blood cell count 11.9, BUN 82  Of note patient's creatinine on 09/06/2014 was 1.5, on 06/15/2016 his creatinine is 1.13 today his creatinine is 4.30 in addition patient is on a total of 2400 mg of Neurontin daily.  Past Medical History:  Diagnosis Date  . Back pain   . Chronic pain   . GERD (gastroesophageal reflux disease)   . Hepatitis C   . Hypertension   . Morbid obesity ([place])   . Personal history of colonic adenoma 01/20/2013  . Sleep apnea     Past Surgical History:  Procedure Laterality Date  . ANKLE SURGERY Right 2001  . APPENDECTOMY  1975  . arm surgery Left 2001  . TESTICLE  TORSION REDUCTION  1980    Family History  Problem Relation Age of Onset  . Hypertension Father        old age--27  . Other Father        enlarged prostate  . Colon cancer Neg Hx     Social History:  reports that he has never smoked. His smokeless tobacco use includes Snuff. He reports that he does not drink alcohol or use drugs.  Allergies  Allergen Reactions  . Codeine Itching    MEDICATIONS:  No current facility-administered medications for this encounter.    Current Outpatient Prescriptions  Medication Sig Dispense Refill  . atenolol (TENORMIN) 50 MG tablet Take 50 mg by mouth daily.    . benztropine (COGENTIN) 0.5 MG tablet Take 0.5 mg by mouth at bedtime.     . cyclobenzaprine (FLEXERIL) 10 MG tablet Take 2 tablets (20 mg total) by mouth 3 (three) times daily as needed for muscle spasms. (Patient taking differently: Take 30 mg by mouth 3 (three) times daily. ) 90 tablet 3  . gabapentin (NEURONTIN) 800 MG tablet Take 800 mg by mouth every 8 (eight) hours.    . hydrochlorothiazide (HYDRODIURIL) 25 MG tablet Take 1 tablet (25 mg total) by mouth daily. 90 tablet 0  . hydrOXYzine (VISTARIL) 25 MG capsule Take 25 mg by mouth 3 (three) times daily.    Marland Kitchen ibuprofen (ADVIL,MOTRIN) 400 MG tablet Take 1 tablet (400 mg total) by mouth every 6 (six) hours as needed for moderate pain. (Patient taking differently: Take 400-800 mg by mouth every 6 (six) hours as needed for moderate pain. ) 40 tablet 0  . lisinopril (PRINIVIL,ZESTRIL) 20 MG tablet TAKE 1 TABLET BY MOUTH ONCE DAILY 90 tablet 0  . Lurasidone HCl 120 MG TABS Take 120 mg by mouth daily.    . minocycline (DYNACIN) 100 MG tablet Take 100 mg by mouth 3 (three) times daily.     . naproxen (NAPROSYN) 500 MG tablet Take 1 tablet (500 mg total) by mouth 2 (two) times daily. Take with food 60 tablet 1  . omeprazole (PRILOSEC)  20 MG capsule Take 1 capsule (20 mg total) by mouth daily. 90 capsule 3  . oxyCODONE (ROXICODONE) 15 MG immediate release tablet Take 15 mg by mouth 3 (three) times daily.     Marland Kitchen rOPINIRole (REQUIP) 4 MG tablet Take 4 mg by mouth 3 (three) times daily.    . sertraline (ZOLOFT) 100 MG tablet Take 100 mg by mouth daily.    . traZODone (DESYREL) 100 MG tablet Take 100 mg by mouth at bedtime.    . triamcinolone cream (KENALOG) 0.1 % Apply 1 application topically as needed (Rash).    . loratadine (CLARITIN) 10 MG tablet Take 1 tablet (10 mg total) by mouth daily. (Patient not taking: Reported on 09/11/2016) 30 tablet 2  . prednisoLONE acetate (PRED FORTE) 1 % ophthalmic suspension Place 1 drop into both eyes 4 (four) times daily.    Marland Kitchen tretinoin (RETIN-A) 0.025 % cream Apply topically at bedtime. (Patient not taking: Reported on 09/11/2016) 45 g 0      ROS:                                                                                                                                       History obtained from the patient  General ROS: negative for - chills, fatigue, fever, night sweats, weight gain or weight loss  Psychological ROS: negative for - behavioral disorder, hallucinations, memory difficulties, mood swings or suicidal ideation Ophthalmic ROS: negative for - blurry vision, double vision, eye pain or loss of vision ENT ROS: negative for - epistaxis, nasal discharge, oral lesions, sore throat, tinnitus or vertigo Allergy and Immunology ROS: negative for - hives or itchy/watery eyes Hematological and Lymphatic ROS: negative for - bleeding problems, bruising or swollen lymph nodes Endocrine ROS: negative for - galactorrhea, hair pattern changes, polydipsia/polyuria or temperature intolerance Respiratory ROS: negative for - cough, hemoptysis, shortness of breath or wheezing Cardiovascular ROS: negative for - chest pain, dyspnea on exertion, edema or irregular heartbeat Gastrointestinal ROS:  negative for - abdominal pain, diarrhea, hematemesis, nausea/vomiting or stool incontinence Genito-Urinary ROS: negative for - dysuria, hematuria, incontinence or urinary frequency/urgency Musculoskeletal ROS: negative for - joint swelling or muscular weakness Neurological ROS: as noted in HPI Dermatological ROS: negative for rash and skin lesion changes   Blood pressure 119/65, pulse 94, temperature 99.1 F (37.3 C), temperature source Oral, resp. rate 18, SpO2 98 %.   Neurologic Examination:                                                                                                      HEENT-  Normocephalic, no lesions, without obvious abnormality.  Normal external eye and conjunctiva.  Normal TM's bilaterally.  Normal auditory canals and external ears. Normal external nose, mucus membranes and septum.  Normal pharynx. Cardiovascular- S1, S2 normal, pulses palpable throughout   Lungs- chest clear, no wheezing, rales, normal symmetric air entry Abdomen- soft, non-tender; bowel sounds normal; no masses,  no organomegaly Extremities- no edema Lymph-no adenopathy palpable Patient has pain throughout all extremities and back with all movements Skin-warm dry and intact  Neurological Examination Mental Status: Alert, oriented to hospital, month and year however he did take him some time to be able to come up with the correct answers. When asked about the history he has a very hard time recalling events and the history gets convoluted with edema days the past and which days events actually occurred.  Speech fluent without evidence of aphasia.   Cranial Nerves: II: Visual fields grossly normal,  III,IV, VI: ptosis not present, extra-ocular motions intact bilaterally pupils equal, round, reactive to light and accommodation--patient has pain that is subjected with eye movements V,VII: smile symmetric, facial light touch sensation normal bilaterally VIII: hearing normal bilaterally IX,X:  uvula rises symmetrically XI: bilateral shoulder shrug XII: midline tongue extension Motor: Patient is able to move all extremities antigravity with upper extremity is greater than lower extremities. He does have a tremor with upper extremities but no asterixis Sensory: Bilateral lower extremity decreased sensation to pinprick light touch and vibration up to his knee Deep Tendon Reflexes: Patient has brisk with hyperreflexia at bilateral knees and ankles with clonus in his left foot. Upper extremities are decreased with 1+. Plantars: Right: downgoing   Left: downgoing Cerebellar: normal finger-to-nose,  Gait: Not tested      Lab Results: Basic Metabolic Panel:  Recent Labs Lab 09/11/16 1435  NA 132*  K 5.4*  CL 100*  CO2 22  GLUCOSE 96  BUN 82*  CREATININE 4.30*  CALCIUM 8.2*    Liver Function Tests:  Recent Labs Lab 09/11/16 1435  AST 63*  ALT 28  ALKPHOS 111  BILITOT 0.7  PROT 6.7  ALBUMIN 3.1*   No results for input(s): LIPASE, AMYLASE in the last 168 hours. No results for input(s): AMMONIA in the last 168 hours.  CBC:  Recent Labs Lab 09/11/16 1435  WBC 11.9*  NEUTROABS 9.7*  HGB 10.0*  HCT 31.6*  MCV 81.7  PLT 244    Cardiac Enzymes: No results for input(s): CKTOTAL, CKMB, CKMBINDEX, TROPONINI in the last 168 hours.  Lipid Panel: No results for input(s): CHOL, TRIG, HDL, CHOLHDL, VLDL, LDLCALC in the last 168 hours.  CBG: No results for input(s): GLUCAP in the last 168 hours.  Microbiology: Results for orders placed or performed in visit on 01/27/15  Throat culture Randell Loop)     Status: None   Collection Time: 01/27/15 10:23 AM  Result Value Ref Range Status   Organism ID, Bacteria Normal Upper Respiratory Flora  Final   Organism ID, Bacteria No Beta Hemolytic Streptococci Isolated  Final    Coagulation Studies: No results for input(s): LABPROT, INR in the last 72 hours.  Imaging: Dg Lumbar Spine Complete  Result Date:  09/11/2016 CLINICAL DATA:  Recent falls with lower back pain EXAM: LUMBAR SPINE - COMPLETE 4+ VIEW COMPARISON:  None. FINDINGS: Five lumbar type vertebral bodies are well visualized. Vertebral body height is well maintained. No pars defects or anterolisthesis is seen. Mild osteophytic changes are noted at thoracolumbar junction. No soft tissue changes are seen. IMPRESSION: No acute abnormality noted. Electronically Signed   By: Inez Catalina M.D.   On: 09/11/2016 13:14   Ct Head Wo Contrast  Result Date: 09/11/2016 CLINICAL DATA:  Multiple recent falls and loss of consciousness. Posterior neck pain. History of hypertension, hepatitis-C. EXAM: CT HEAD WITHOUT CONTRAST CT CERVICAL SPINE WITHOUT CONTRAST TECHNIQUE: Multidetector CT imaging of the head and cervical spine was performed following the standard protocol without intravenous contrast. Multiplanar CT image reconstructions of the cervical spine were also generated. COMPARISON:  Cervical spine radiographs Aug 24, 2013 and CT cervical spine November 15, 2011 FINDINGS: CT HEAD FINDINGS BRAIN: No intraparenchymal hemorrhage, mass effect nor midline shift. The ventricles and sulci are normal. No acute large vascular territory infarcts. No abnormal extra-axial fluid collections. Basal cisterns are patent. VASCULAR: Unremarkable. SKULL/SOFT TISSUES: No skull fracture. No significant soft tissue swelling. ORBITS/SINUSES: The included ocular globes and orbital contents are normal.Trace paranasal sinus mucosal thickening and scattered subcentimeter mucosal retention cyst. OTHER: None. CT CERVICAL SPINE FINDINGS Large body habitus results in overall noisy image quality. ALIGNMENT: Straightened lordosis. Vertebral bodies in alignment. SKULL BASE AND VERTEBRAE: Cervical vertebral bodies and posterior elements are intact. Intervertebral disc heights preserved bridging ventral osteophytes C4 through C7. Segmental calcification the posterior longitudinal ligament. No destructive  bony lesions. C1-2 articulation maintained. Longus coli insertional enthesopathy. SOFT TISSUES AND SPINAL CANAL: Nonacute, limited assessment due to body habitus. DISC LEVELS: Mild canal stenosis C2-3, C4-5, C5-6 and C6-7 due to ossified posterior longitudinal ligament, possible mild canal stenosis at C6-7. No significant osseous neural foraminal narrowing. UPPER CHEST: Lung apices are clear. OTHER: None. IMPRESSION: CT HEAD: Negative noncontrast CT HEAD for age. CT CERVICAL SPINE: Habitus limited examination. No acute fracture or malalignment. Findings of DISH and OPLL. Multilevel mild osseous canal stenosis without significant neural foraminal narrowing. Electronically  Signed   By: Elon Alas M.D.   On: 09/11/2016 14:16   Ct Cervical Spine Wo Contrast  Result Date: 09/11/2016 CLINICAL DATA:  Multiple recent falls and loss of consciousness. Posterior neck pain. History of hypertension, hepatitis-C. EXAM: CT HEAD WITHOUT CONTRAST CT CERVICAL SPINE WITHOUT CONTRAST TECHNIQUE: Multidetector CT imaging of the head and cervical spine was performed following the standard protocol without intravenous contrast. Multiplanar CT image reconstructions of the cervical spine were also generated. COMPARISON:  Cervical spine radiographs Aug 24, 2013 and CT cervical spine November 15, 2011 FINDINGS: CT HEAD FINDINGS BRAIN: No intraparenchymal hemorrhage, mass effect nor midline shift. The ventricles and sulci are normal. No acute large vascular territory infarcts. No abnormal extra-axial fluid collections. Basal cisterns are patent. VASCULAR: Unremarkable. SKULL/SOFT TISSUES: No skull fracture. No significant soft tissue swelling. ORBITS/SINUSES: The included ocular globes and orbital contents are normal.Trace paranasal sinus mucosal thickening and scattered subcentimeter mucosal retention cyst. OTHER: None. CT CERVICAL SPINE FINDINGS Large body habitus results in overall noisy image quality. ALIGNMENT: Straightened lordosis.  Vertebral bodies in alignment. SKULL BASE AND VERTEBRAE: Cervical vertebral bodies and posterior elements are intact. Intervertebral disc heights preserved bridging ventral osteophytes C4 through C7. Segmental calcification the posterior longitudinal ligament. No destructive bony lesions. C1-2 articulation maintained. Longus coli insertional enthesopathy. SOFT TISSUES AND SPINAL CANAL: Nonacute, limited assessment due to body habitus. DISC LEVELS: Mild canal stenosis C2-3, C4-5, C5-6 and C6-7 due to ossified posterior longitudinal ligament, possible mild canal stenosis at C6-7. No significant osseous neural foraminal narrowing. UPPER CHEST: Lung apices are clear. OTHER: None. IMPRESSION: CT HEAD: Negative noncontrast CT HEAD for age. CT CERVICAL SPINE: Habitus limited examination. No acute fracture or malalignment. Findings of DISH and OPLL. Multilevel mild osseous canal stenosis without significant neural foraminal narrowing. Electronically Signed   By: Elon Alas M.D.   On: 09/11/2016 14:16       Assessment and plan per attending neurologist  Etta Quill PA-C [facility] [phone]  09/11/2016, 3:30 PM   Assessment/Plan: 56 year old male with fall to try suspect is due to asterixis. His encephalopathy and myoclonus/asterixis could certainly be explained by his gabapentin dose in the setting of acute renal failure. Other ideologies could be investigated including hyperammonemia. If he is able to obtain an MRI of the brain and cervical spine then I think that this would be reasonable, otherwise I would treat this per [place] Saliva is a metabolic encephalopathy. Narcotics can also cause myoclonus. His elevated CK could certainly be due to traumatic injury given that he reports that he fell and injured himself.  1) MRI brain and cervical spine 2) ammonia 3) hold gabapentin 4) treatment of renal failure per internal medicine 5) neurology will continue to follow  Roland Rack, MD [facility] [phone]  If 7pm- 7am, please page neurology on call as listed in [place].

## 2016-09-11 NOTE — ED Notes (Addendum)
Pt states this morning, his L arm went numb and both his legs became while walking from the bathroom. He then fell and hit back of head on shower with possible LOC. Awake, VSS, pt altered per father who is at bedside

## 2016-09-11 NOTE — ED Notes (Signed)
Patient transported to MRI 

## 2016-09-11 NOTE — Progress Notes (Signed)
Christian Guerra is a 56 y.o. male patient admitted from ED awake, alert - oriented  X 3 - no acute distress noted.  VSS - Blood pressure 112/68, pulse 96, temperature 99.1 F (37.3 C), temperature source Oral, resp. rate 15, SpO2 98 %.    IV in place, occlusive dsg intact without redness.  Orientation to room, and floor completed with information packet given to patient/family.  Patient declined safety video at this time.  Admission INP armband ID verified with patient/family, and in place.   SR up x 1, fall assessment complete, with patient unable to verbalize understanding of risk associated with falls, and understanding to call nsg before up out of bed. Bed alarm on. Call light within reach, patient able to voice, and demonstrate understanding. Performed Skin assessment with Judson Roch, RN moisture associated redness around skin folds underneath arms and groin area. Abrasion on right lower leg and generalized scabes all over body.      Will cont to eval and treat per MD orders.  Karolee Ohs, RN 09/11/2016 7:00 PM

## 2016-09-11 NOTE — ED Triage Notes (Signed)
Pt sts fall x 2 this am; pt sts thinks he tripped on rug; pt answers questions but appears to be lethargic; pt sts hit back of head and had LOC

## 2016-09-11 NOTE — ED Provider Notes (Signed)
[facility] DEPT Provider Note   CSN: [id number] Arrival date & time: 09/11/16  1007     History   Chief Complaint Chief Complaint  Patient presents with  . Fall    HPI Christian Guerra is a 56 y.o. male.  HPI   Pt is a 56 yo male with PMH of Hep C, GERD, HTN, OSA and morbid obesity who presents to the ED with complaint of fall. Pt reports When he first got up this morning as he stood up he was unable to remain standing up and resulted in squatting down onto his buttocks. He states he then stood up and was walking to use the restroom but notes while he was standing in the bathroom he fell due to his legs feeling numb which resulted in him hitting the back of his head on the shower. Patient endorses LOC. Patient reports since the fall he has had a headache, neck pain, low back pain and has felt more confused. He also reports having weakness to his left hand with associated decreased grip strength. Father at bedside reports that patient appears confused and not at his baseline mental status. Father also reports patient having a fall 3 days ago when they were eating at [employer]. He reports when he stood up from the booth his legs gave out on him resulting in him falling to the ground, denies head injury or LOC. Patient also notes he has had intermittent numbness to bilateral hands and reports currently having a tingling sensation to his left hand. Denies fever, visual changes, dizziness, chest pain, shortness of breath, abdominal pain, vomiting, loss of control of bowel or bladder, saddle anesthesia. Patient endorses also having history of seizures but notes he has not had seizures in years and reports this episode is not consistent with his typical seizures. He states he has been taking all of his home medications as prescribed. Denies use of anticoagulants.  Past Medical History:  Diagnosis Date  . Back pain   . Chronic pain   . GERD (gastroesophageal reflux disease)   . Hepatitis C   .  Hypertension   . Morbid obesity ([place])   . Personal history of colonic adenoma 01/20/2013  . Sleep apnea     Patient Active Problem List   Diagnosis Date Noted  . Acute kidney injury ([place]) 09/11/2016  . Acute encephalopathy 09/11/2016  . Major depression 08/04/2013  . Generalized anxiety disorder 05/21/2013  . Acne 01/26/2013  . Personal history of colonic adenoma 01/20/2013  . OSA (obstructive sleep apnea) 09/27/2012  . Chronic back pain 09/19/2012  . Hepatitis C 09/19/2012  . GERD (gastroesophageal reflux disease) 09/19/2012  . HTN (hypertension) 09/19/2012    Past Surgical History:  Procedure Laterality Date  . ANKLE SURGERY Right 2001  . APPENDECTOMY  1975  . arm surgery Left 2001  . TESTICLE TORSION REDUCTION  1980       Home Medications    Prior to Admission medications   Medication Sig Start Date End Date Taking? Authorizing Provider  atenolol (TENORMIN) 50 MG tablet Take 50 mg by mouth daily.   Yes [provider]  benztropine (COGENTIN) 0.5 MG tablet Take 0.5 mg by mouth at bedtime.    Yes [provider]  cyclobenzaprine (FLEXERIL) 10 MG tablet Take 2 tablets (20 mg total) by mouth 3 (three) times daily as needed for muscle spasms. Patient taking differently: Take 30 mg by mouth 3 (three) times daily.  11/05/13  Yes Theodis Blaze, MD  gabapentin (NEURONTIN) 800 MG tablet Take 800 mg by mouth every 8 (eight) hours.   Yes [provider]  hydrochlorothiazide (HYDRODIURIL) 25 MG tablet Take 1 tablet (25 mg total) by mouth daily. 07/13/16  Yes Hairston, Maylon Peppers, FNP  hydrOXYzine (VISTARIL) 25 MG capsule Take 25 mg by mouth 3 (three) times daily.   Yes [provider]  ibuprofen (ADVIL,MOTRIN) 400 MG tablet Take 1 tablet (400 mg total) by mouth every 6 (six) hours as needed for moderate pain. Patient taking differently: Take 400-800 mg by mouth every 6 (six) hours as needed for moderate pain.  06/15/16  Yes Hairston, Mandesia R, FNP    lisinopril (PRINIVIL,ZESTRIL) 20 MG tablet TAKE 1 TABLET BY MOUTH ONCE DAILY 09/06/16  Yes Hairston, Mandesia R, FNP  Lurasidone HCl 120 MG TABS Take 120 mg by mouth daily.   Yes [provider]  minocycline (DYNACIN) 100 MG tablet Take 100 mg by mouth 3 (three) times daily.    Yes [provider]  naproxen (NAPROSYN) 500 MG tablet Take 1 tablet (500 mg total) by mouth 2 (two) times daily. Take with food 05/19/14  Yes Lance Bosch, NP  omeprazole (PRILOSEC) 20 MG capsule Take 1 capsule (20 mg total) by mouth daily. 11/05/13  Yes Theodis Blaze, MD  oxyCODONE (ROXICODONE) 15 MG immediate release tablet Take 15 mg by mouth 3 (three) times daily.    Yes [provider]  rOPINIRole (REQUIP) 4 MG tablet Take 4 mg by mouth 3 (three) times daily.   Yes [provider]  sertraline (ZOLOFT) 100 MG tablet Take 100 mg by mouth daily.   Yes [provider]  traZODone (DESYREL) 100 MG tablet Take 100 mg by mouth at bedtime.   Yes [provider]  triamcinolone cream (KENALOG) 0.1 % Apply 1 application topically as needed (Rash).   Yes [provider]  loratadine (CLARITIN) 10 MG tablet Take 1 tablet (10 mg total) by mouth daily. Patient not taking: Reported on 09/11/2016 01/27/15   Lance Bosch, NP  prednisoLONE acetate (PRED FORTE) 1 % ophthalmic suspension Place 1 drop into both eyes 4 (four) times daily.    [provider]  tretinoin (RETIN-A) 0.025 % cream Apply topically at bedtime. Patient not taking: Reported on 09/11/2016 05/21/13   Tresa Garter, MD    Family History Family History  Problem Relation Age of Onset  . Hypertension Father        old age--5  . Other Father        enlarged prostate  . Colon cancer Neg Hx     Social History Social History  Substance Use Topics  . Smoking status: Never Smoker  . Smokeless tobacco: Current User    Types: Snuff  . Alcohol use No     Comment: occ     Allergies    Codeine   Review of Systems Review of Systems  Musculoskeletal: Positive for back pain and neck pain.  Neurological: Positive for tremors, weakness, numbness and headaches.  Psychiatric/Behavioral: Positive for confusion.     Physical Exam Updated Vital Signs BP (!) 108/96   Pulse 94   Temp 99.1 F (37.3 C) (Oral)   Resp 13   SpO2 98%   Physical Exam  Constitutional: He is oriented to person, place, and time. He appears well-developed and well-nourished.  Morbidly obese  HENT:  Head: Normocephalic and atraumatic. Head is without raccoon's eyes, without Battle's sign, without abrasion, without contusion and without  laceration.  Right Ear: Tympanic membrane normal. No hemotympanum.  Left Ear: Tympanic membrane normal. No hemotympanum.  Nose: Nose normal. No sinus tenderness, nasal deformity, septal deviation or nasal septal hematoma. No epistaxis.  Mouth/Throat: Uvula is midline, oropharynx is clear and moist and mucous membranes are normal. No oropharyngeal exudate, posterior oropharyngeal edema, posterior oropharyngeal erythema or tonsillar abscesses. No tonsillar exudate.  Eyes: Conjunctivae and EOM are normal. Pupils are equal, round, and reactive to light. Right eye exhibits no discharge. Left eye exhibits no discharge. No scleral icterus.  Neck: Normal range of motion. Neck supple.  Cardiovascular: Normal rate, regular rhythm, normal heart sounds and intact distal pulses.   Pulmonary/Chest: Effort normal and breath sounds normal. No respiratory distress. He has no wheezes. He has no rales. He exhibits no tenderness.  Abdominal: Soft. Bowel sounds are normal. He exhibits no distension and no mass. There is no tenderness. There is no rebound and no guarding. No hernia.  Musculoskeletal: Normal range of motion. He exhibits no edema, tenderness or deformity.  Mild TTP over cervical and lumbar midline spine. No TTP over thoracic spine midline  Full ROM of bilateral upper and  lower extremities with 5/5 strength. Equal grip strength bilaterally. 2+ radial and PT pulses. Sensation grossly intact.   Neurological: He is alert and oriented to person, place, and time. He has normal strength. No cranial nerve deficit or sensory deficit. Coordination and gait normal.  Pt A&Ox3 but appears confused during exam and asks multiple times to repeat questions or commands. Pt with intermittent jerking spasms to bilateral arms. Spontaneously moving all 4 extremities with FROM and 5/5 strength.   Skin: Skin is warm and dry. He is not diaphoretic.  Nursing note and vitals reviewed.    ED Treatments / Results  Labs (all labs ordered are listed, but only abnormal results are displayed) Labs Reviewed  CBC WITH DIFFERENTIAL/PLATELET - Abnormal; Notable for the following:       Result Value   WBC 11.9 (*)    RBC 3.87 (*)    Hemoglobin 10.0 (*)    HCT 31.6 (*)    MCH 25.8 (*)    RDW 15.8 (*)    Neutro Abs 9.7 (*)    All other components within normal limits  COMPREHENSIVE METABOLIC PANEL - Abnormal; Notable for the following:    Sodium 132 (*)    Potassium 5.4 (*)    Chloride 100 (*)    BUN 82 (*)    Creatinine, Ser 4.30 (*)    Calcium 8.2 (*)    Albumin 3.1 (*)    AST 63 (*)    GFR calc non Af Amer 14 (*)    GFR calc Af Amer 16 (*)    All other components within normal limits  URINALYSIS, ROUTINE W REFLEX MICROSCOPIC - Abnormal; Notable for the following:    Hgb urine dipstick MODERATE (*)    Squamous Epithelial / LPF 0-5 (*)    All other components within normal limits  RAPID URINE DRUG SCREEN, HOSP PERFORMED - Abnormal; Notable for the following:    Opiates POSITIVE (*)    All other components within normal limits  ETHANOL  CK    EKG  EKG Interpretation  Date/Time:  Tuesday Sep 11 2016 14:51:36 EDT Ventricular Rate:  97 PR Interval:    QRS Duration: 96 QT Interval:  346 QTC Calculation: 440 R Axis:   66 Text Interpretation:  Normal sinus rhythm Low  voltage, precordial leads No significant change  since last tracing Confirmed by [facility] MD, [place] ([id number]) on 09/11/2016 3:44:39 PM       Radiology Dg Lumbar Spine Complete  Result Date: 09/11/2016 CLINICAL DATA:  Recent falls with lower back pain EXAM: LUMBAR SPINE - COMPLETE 4+ VIEW COMPARISON:  None. FINDINGS: Five lumbar type vertebral bodies are well visualized. Vertebral body height is well maintained. No pars defects or anterolisthesis is seen. Mild osteophytic changes are noted at thoracolumbar junction. No soft tissue changes are seen. IMPRESSION: No acute abnormality noted. Electronically Signed   By: Inez Catalina M.D.   On: 09/11/2016 13:14   Ct Head Wo Contrast  Result Date: 09/11/2016 CLINICAL DATA:  Multiple recent falls and loss of consciousness. Posterior neck pain. History of hypertension, hepatitis-C. EXAM: CT HEAD WITHOUT CONTRAST CT CERVICAL SPINE WITHOUT CONTRAST TECHNIQUE: Multidetector CT imaging of the head and cervical spine was performed following the standard protocol without intravenous contrast. Multiplanar CT image reconstructions of the cervical spine were also generated. COMPARISON:  Cervical spine radiographs Aug 24, 2013 and CT cervical spine November 15, 2011 FINDINGS: CT HEAD FINDINGS BRAIN: No intraparenchymal hemorrhage, mass effect nor midline shift. The ventricles and sulci are normal. No acute large vascular territory infarcts. No abnormal extra-axial fluid collections. Basal cisterns are patent. VASCULAR: Unremarkable. SKULL/SOFT TISSUES: No skull fracture. No significant soft tissue swelling. ORBITS/SINUSES: The included ocular globes and orbital contents are normal.Trace paranasal sinus mucosal thickening and scattered subcentimeter mucosal retention cyst. OTHER: None. CT CERVICAL SPINE FINDINGS Large body habitus results in overall noisy image quality. ALIGNMENT: Straightened lordosis. Vertebral bodies in alignment. SKULL BASE AND VERTEBRAE: Cervical vertebral  bodies and posterior elements are intact. Intervertebral disc heights preserved bridging ventral osteophytes C4 through C7. Segmental calcification the posterior longitudinal ligament. No destructive bony lesions. C1-2 articulation maintained. Longus coli insertional enthesopathy. SOFT TISSUES AND SPINAL CANAL: Nonacute, limited assessment due to body habitus. DISC LEVELS: Mild canal stenosis C2-3, C4-5, C5-6 and C6-7 due to ossified posterior longitudinal ligament, possible mild canal stenosis at C6-7. No significant osseous neural foraminal narrowing. UPPER CHEST: Lung apices are clear. OTHER: None. IMPRESSION: CT HEAD: Negative noncontrast CT HEAD for age. CT CERVICAL SPINE: Habitus limited examination. No acute fracture or malalignment. Findings of DISH and OPLL. Multilevel mild osseous canal stenosis without significant neural foraminal narrowing. Electronically Signed   By: Elon Alas M.D.   On: 09/11/2016 14:16   Ct Cervical Spine Wo Contrast  Result Date: 09/11/2016 CLINICAL DATA:  Multiple recent falls and loss of consciousness. Posterior neck pain. History of hypertension, hepatitis-C. EXAM: CT HEAD WITHOUT CONTRAST CT CERVICAL SPINE WITHOUT CONTRAST TECHNIQUE: Multidetector CT imaging of the head and cervical spine was performed following the standard protocol without intravenous contrast. Multiplanar CT image reconstructions of the cervical spine were also generated. COMPARISON:  Cervical spine radiographs Aug 24, 2013 and CT cervical spine November 15, 2011 FINDINGS: CT HEAD FINDINGS BRAIN: No intraparenchymal hemorrhage, mass effect nor midline shift. The ventricles and sulci are normal. No acute large vascular territory infarcts. No abnormal extra-axial fluid collections. Basal cisterns are patent. VASCULAR: Unremarkable. SKULL/SOFT TISSUES: No skull fracture. No significant soft tissue swelling. ORBITS/SINUSES: The included ocular globes and orbital contents are normal.Trace paranasal sinus  mucosal thickening and scattered subcentimeter mucosal retention cyst. OTHER: None. CT CERVICAL SPINE FINDINGS Large body habitus results in overall noisy image quality. ALIGNMENT: Straightened lordosis. Vertebral bodies in alignment. SKULL BASE AND VERTEBRAE: Cervical vertebral bodies and posterior elements are intact. Intervertebral disc heights preserved bridging ventral osteophytes  C4 through C7. Segmental calcification the posterior longitudinal ligament. No destructive bony lesions. C1-2 articulation maintained. Longus coli insertional enthesopathy. SOFT TISSUES AND SPINAL CANAL: Nonacute, limited assessment due to body habitus. DISC LEVELS: Mild canal stenosis C2-3, C4-5, C5-6 and C6-7 due to ossified posterior longitudinal ligament, possible mild canal stenosis at C6-7. No significant osseous neural foraminal narrowing. UPPER CHEST: Lung apices are clear. OTHER: None. IMPRESSION: CT HEAD: Negative noncontrast CT HEAD for age. CT CERVICAL SPINE: Habitus limited examination. No acute fracture or malalignment. Findings of DISH and OPLL. Multilevel mild osseous canal stenosis without significant neural foraminal narrowing. Electronically Signed   By: Elon Alas M.D.   On: 09/11/2016 14:16    Procedures Procedures (including critical care time)  Medications Ordered in ED Medications - No data to display   Initial Impression / Assessment and Plan / ED Course  I have reviewed the triage vital signs and the nursing notes.  Pertinent labs & imaging results that were available during my care of the patient were reviewed by me and considered in my medical decision making (see chart for details).     Patient presents s/p fall with reported head injury and LOC. Patient reports he has had multiple falls over the past few days due to "his legs falling asleep on him". Also reports intermittent numbness to both of his hands and states after his fall he had decreased strength to left hand. VSS. On  initial evaluation, patient is alert and oriented x4, however patient appears slightly confused and intermittently asked to have questions repeated and intermittently has difficulty following commands until the command has been repeated. Patient with intermittent jerking/myoclonus movement to BUE throughout exam. Remaining neuro exam without focal deficits. TTP over midline cervical and lumbar spine. No evidence of head injury. CT head and cervical spine negative. Negative lumbar spine. WBC 11.9. K 5.4. Cr 4.3, BUN 82. Chart review shows previous Cr 1.1-1.3. UA without signs of infection. UDS positive for opiates. Discussed pt with Dr. Billy Fischer who also evaluated pt in the ED. Plan to consult neurology regarding pt's neurological sxs. Neurology advised to order MRI brain and report they will come evaluate pt in the ED. Plan to admit pt to hospitalist service for AKI and further evaluation of numbness/weakness. Dyanne Carrel, NP agrees to admission. Discussed results and plan for admission with pt.   Final Clinical Impressions(s) / ED Diagnoses   Final diagnoses:  Numbness  Fall, initial encounter  Confusion  Weakness    New Prescriptions New Prescriptions   No medications on file     Nona Dell, Hershal Coria 09/11/16 1559    Gareth Morgan, MD 09/14/16 1124

## 2016-09-11 NOTE — ED Notes (Signed)
Pt could not tolerate MRI. Increased confusion.

## 2016-09-11 NOTE — Progress Notes (Signed)
Pt states wears FFM at home and setting is 11 CMH20.  Pt placed on Cpap 11CMH20 with full face mask.  Tolerating well at this time.

## 2016-09-11 NOTE — ED Notes (Signed)
Called lab to add on ck level.

## 2016-09-12 ENCOUNTER — Inpatient Hospital Stay (HOSPITAL_COMMUNITY): Payer: Self-pay

## 2016-09-12 DIAGNOSIS — G9340 Encephalopathy, unspecified: Secondary | ICD-10-CM

## 2016-09-12 LAB — PROTEIN ELECTROPHORESIS, SERUM
A/G RATIO SPE: 1 (ref 0.7–1.7)
ALBUMIN ELP: 3.2 g/dL (ref 2.9–4.4)
ALPHA-1-GLOBULIN: 0.3 g/dL (ref 0.0–0.4)
Alpha-2-Globulin: 0.8 g/dL (ref 0.4–1.0)
Beta Globulin: 1.1 g/dL (ref 0.7–1.3)
GAMMA GLOBULIN: 1.2 g/dL (ref 0.4–1.8)
GLOBULIN, TOTAL: 3.3 g/dL (ref 2.2–3.9)
Total Protein ELP: 6.5 g/dL (ref 6.0–8.5)

## 2016-09-12 LAB — COMPREHENSIVE METABOLIC PANEL
ALBUMIN: 2.8 g/dL — AB (ref 3.5–5.0)
ALT: 27 U/L (ref 17–63)
AST: 51 U/L — AB (ref 15–41)
Alkaline Phosphatase: 110 U/L (ref 38–126)
Anion gap: 9 (ref 5–15)
BILIRUBIN TOTAL: 0.6 mg/dL (ref 0.3–1.2)
BUN: 57 mg/dL — AB (ref 6–20)
CALCIUM: 8.3 mg/dL — AB (ref 8.9–10.3)
CO2: 23 mmol/L (ref 22–32)
Chloride: 103 mmol/L (ref 101–111)
Creatinine, Ser: 2.2 mg/dL — ABNORMAL HIGH (ref 0.61–1.24)
GFR calc Af Amer: 37 mL/min — ABNORMAL LOW (ref >60)
GFR, EST NON AFRICAN AMERICAN: 32 mL/min — AB (ref >60)
GLUCOSE: 98 mg/dL (ref 65–99)
Potassium: 4.5 mmol/L (ref 3.5–5.1)
Sodium: 135 mmol/L (ref 135–145)
TOTAL PROTEIN: 6.7 g/dL (ref 6.5–8.1)

## 2016-09-12 LAB — HIV ANTIBODY (ROUTINE TESTING W REFLEX): HIV SCREEN 4TH GENERATION: NONREACTIVE

## 2016-09-12 LAB — FOLATE RBC
FOLATE, RBC: 1820 ng/mL (ref >498)
Folate, Hemolysate: 544.1 ng/mL
HEMATOCRIT: 29.9 % — AB (ref 37.5–51.0)

## 2016-09-12 LAB — CBC
HCT: 30.4 % — ABNORMAL LOW (ref 39.0–52.0)
Hemoglobin: 9.6 g/dL — ABNORMAL LOW (ref 13.0–17.0)
MCH: 25.8 pg — ABNORMAL LOW (ref 26.0–34.0)
MCHC: 31.6 g/dL (ref 30.0–36.0)
MCV: 81.7 fL (ref 78.0–100.0)
PLATELETS: 243 10*3/uL (ref 150–400)
RBC: 3.72 MIL/uL — ABNORMAL LOW (ref 4.22–5.81)
RDW: 15.7 % — AB (ref 11.5–15.5)
WBC: 8.3 10*3/uL (ref 4.0–10.5)

## 2016-09-12 LAB — CK: CK TOTAL: 2000 U/L — AB (ref 49–397)

## 2016-09-12 LAB — RPR: RPR Ser Ql: NONREACTIVE

## 2016-09-12 MED ORDER — HALOPERIDOL LACTATE 5 MG/ML IJ SOLN
INTRAMUSCULAR | Status: AC
  Filled 2016-09-12: qty 1

## 2016-09-12 MED ORDER — LORAZEPAM 2 MG/ML IJ SOLN
1.0000 mg | Freq: Once | INTRAMUSCULAR | Status: AC
  Administered 2016-09-12: 1 mg via INTRAVENOUS
  Filled 2016-09-12: qty 1

## 2016-09-12 MED ORDER — SODIUM CHLORIDE 0.9 % IV SOLN
INTRAVENOUS | Status: DC
  Administered 2016-09-12 – 2016-09-13 (×4): via INTRAVENOUS

## 2016-09-12 MED ORDER — ALBUTEROL SULFATE (2.5 MG/3ML) 0.083% IN NEBU: 2.5000 mg | INHALATION_SOLUTION | RESPIRATORY_TRACT | Status: DC | PRN

## 2016-09-12 MED ORDER — HALOPERIDOL LACTATE 5 MG/ML IJ SOLN
5.0000 mg | Freq: Once | INTRAMUSCULAR | Status: AC
  Administered 2016-09-12: 5 mg via INTRAVENOUS
  Filled 2016-09-12: qty 1

## 2016-09-12 MED ORDER — OXYCODONE HCL 5 MG PO TABS: 5.0000 mg | ORAL_TABLET | Freq: Three times a day (TID) | ORAL | Status: DC | PRN

## 2016-09-12 MED ORDER — HALOPERIDOL LACTATE 5 MG/ML IJ SOLN: 2.0000 mg | Freq: Once | INTRAMUSCULAR | Status: DC

## 2016-09-12 MED ORDER — CYCLOBENZAPRINE HCL 5 MG PO TABS: 5.0000 mg | ORAL_TABLET | Freq: Three times a day (TID) | ORAL | Status: DC | PRN

## 2016-09-12 NOTE — Progress Notes (Signed)
[facility] Progress Note  Patient: Christian Guerra WUJ:[id number]   PCP: Alfonse Spruce, FNP DOB: 30-Jun-1960   DOA: 09/11/2016   DOS: 09/12/2016   Date of Service: the patient was seen and examined on 09/12/2016  Subjective: Patient was agitated but this morning and was getting out of the bed. Later in the afternoon he started refusing IV line. Father at bedside, agrees with the patient does not have any insight in his care.  Brief hospital course: Pt. with PMH of morbid obesity, chronic pain syndrome, recurrent fall, always say, chronic diastolic CHF, mood disorder; admitted on 09/11/2016, presented with complaint of confusion, was found to have acute kidney injury and acute encephalopathy. Currently further plan is for aggressive IV hydration.  Assessment and Plan: 1. Acute encephalopathy. CT of the head without acute abnormality. No focal deficits. MRI ordered by neurology patient unable to lay still. Likely related to metabolic derangements in setting of acute renal failure and rhadomylosis - continue IV fluids - Hold nephrotoxins - unremarkable renal ultrasound - Monitor urine output - Decrease sedating medications  2. Acute kidney injury. Traumatic rhabdomyolysis  Home medications include lisinopril hydrochlorothiazide. - Hold nephrotoxins - Monitor urine output - Nephrology consult appreciated, pt not compliant with IV line and that has been posing problems with continuing IV fluids.   3. Hypertension. Blood pressure fairly soft in the emergency department. Home medications include atenolol. -Hold lisinopril and hydrochlorothiazide -resume atenolol  4. Chronic pain. Patient with a history of low back pain. Home medications include, Neurontin, Flexeril, Roxicodone IR, Naprosyn, Norco -Decrease sedating medications -Physical therapy -Monitor  5. Obstructive sleep apnea. Patient refuses C Pap according to chart. Monitor.  6. Obesity BMI Greater than  38 -nutritional consult  7.chronic Diastolic dysfunction. Echo in December 2017 with an EF 55-60% mild LVH grade 2 diastolic dysfunction.  medications include hydrochlorothiazide and lisinopril as well as atenolol -Monitor intake and output -Obtain daily weights -Continue beta blocker -monitor closely given need for IV fluids.   Diet: renal diet DVT Prophylaxis: subcutaneous Heparin  Advance goals of care discussion: full code  Family Communication: family was present at bedside, at the time of interview. The pt provided permission to discuss medical plan with the family. Opportunity was given to ask question and all questions were answered satisfactorily.   Disposition:  Discharge to be decided.  Consultants: nephrology, neurology Procedures: none  Antibiotics: Anti-infectives    Start     Dose/Rate Route Frequency Ordered Stop   09/11/16 2200  minocycline (MINOCIN,DYNACIN) capsule 100 mg     100 mg Oral 3 times daily 09/11/16 1646         Objective: Physical Exam: Vitals:   09/12/16 0501 09/12/16 0828 09/12/16 0932 09/12/16 1452  BP: 117/60  130/74 (!) 118/54  Pulse: 91  89 85  Resp: 20   20  Temp: 98.4 F (36.9 C)   98.4 F (36.9 C)  TempSrc: Oral     SpO2: 97% 98%  100%    Intake/Output Summary (Last 24 hours) at 09/12/16 1726 Last data filed at 09/12/16 1035  Gross per 24 hour  Intake             1065 ml  Output             3500 ml  Net            -2435 ml   There were no vitals filed for this visit. General: Alert, Awake and Oriented to Person. Appear in moderate  distress, affect appropriate Eyes: PERRL, Conjunctiva normal ENT: Oral Mucosa clear moist. Neck: difficult to assess JVD, no Abnormal Mass Or lumps Cardiovascular: S1 and S2 Present, no Murmur, Respiratory: Bilateral Air entry equal and Decreased, no use of accessory muscle, Clear to Auscultation, no Crackles, no wheezes Abdomen: Bowel Sound present, Soft and no tenderness Skin: no redness,  no Rash, no induration Extremities: trace Pedal edema, no calf tenderness Neurologic: Grossly no focal neuro deficit. Bilaterally Equal motor strength  Data Reviewed: CBC:  Recent Labs Lab 09/11/16 1435 09/11/16 1803 09/12/16 0359  WBC 11.9*  --  8.3  NEUTROABS 9.7*  --   --   HGB 10.0*  --  9.6*  HCT 31.6* 29.9* 30.4*  MCV 81.7  --  81.7  PLT 244  --  542   Basic Metabolic Panel:  Recent Labs Lab 09/11/16 1435 09/11/16 1803 09/12/16 0359  NA 132*  --  135  K 5.4*  --  4.5  CL 100*  --  103  CO2 22  --  23  GLUCOSE 96  --  98  BUN 82*  --  57*  CREATININE 4.30* 3.53* 2.20*  CALCIUM 8.2*  --  8.3*    Liver Function Tests:  Recent Labs Lab 09/11/16 1435 09/12/16 0359  AST 63* 51*  ALT 28 27  ALKPHOS 111 110  BILITOT 0.7 0.6  PROT 6.7 6.7  ALBUMIN 3.1* 2.8*   No results for input(s): LIPASE, AMYLASE in the last 168 hours.  Recent Labs Lab 09/11/16 1901  AMMONIA 16   Coagulation Profile: No results for input(s): INR, PROTIME in the last 168 hours. Cardiac Enzymes:  Recent Labs Lab 09/11/16 1435 09/11/16 2147 09/12/16 0359  CKTOTAL 3,010* 2,402* 2,000*   BNP (last 3 results) No results for input(s): PROBNP in the last 8760 hours. CBG: No results for input(s): GLUCAP in the last 168 hours. Studies: US Renal  Result Date: 09/11/2016 CLINICAL DATA:  Acute kidney injury EXAM: RENAL / URINARY TRACT ULTRASOUND COMPLETE COMPARISON:  12/04/2012 FINDINGS: Right Kidney: Length: 12.1 cm. Echogenicity within normal limits. No mass or hydronephrosis visualized. Left Kidney: Length: 12.5 cm. Echogenicity within normal limits. No mass or hydronephrosis visualized. Bladder: Appears normal for degree of bladder distention. IMPRESSION: Negative renal ultrasound Electronically Signed   By: Donavan Foil M.D.   On: 09/11/2016 23:13   Dg Chest Port 1 View  Result Date: 09/11/2016 CLINICAL DATA:  Wheezing EXAM: PORTABLE CHEST 1 VIEW COMPARISON:  06/06/2016 FINDINGS:  Hypoventilation with bibasilar atelectasis. Negative for heart failure or effusion IMPRESSION: Hypoventilation with bibasilar atelectasis. Electronically Signed   By: Franchot Gallo M.D.   On: 09/11/2016 20:51    Scheduled Meds: . atenolol  50 mg Oral Daily  . benztropine  0.5 mg Oral QHS  . heparin  5,000 Units Subcutaneous Q8H  . lurasidone  120 mg Oral Daily  . minocycline  100 mg Oral TID  . pantoprazole  40 mg Oral Daily  . prednisoLONE acetate  1 drop Both Eyes QID  . rOPINIRole  4 mg Oral TID  . sertraline  50 mg Oral Daily  . sodium chloride flush  3 mL Intravenous Q12H  . traZODone  50 mg Oral QHS   Continuous Infusions: . sodium chloride 150 mL/hr at 09/12/16 1726   PRN Meds: acetaminophen **OR** acetaminophen, albuterol, cyclobenzaprine, ondansetron **OR** ondansetron (ZOFRAN) IV, oxyCODONE  Time spent: 35 minutes  Author: Berle Mull, MD [facility] Pager: [phone] 09/12/2016 5:26 PM  If 7PM-7AM,  please contact night-coverage at [email or web address], password [facility]

## 2016-09-12 NOTE — Progress Notes (Signed)
Subjective: Patient feels that he is somewhat more clear headed however he still having some difficulty with his mentation. He seems to hold a sentence better then he was able to before but still is notably having difficulty getting his thoughts out.  Exam: Vitals:   09/12/16 0501 09/12/16 0932  BP: 117/60 130/74  Pulse: 91 89  Resp: 20   Temp: 98.4 F (36.9 C)     HEENT-  Normocephalic, no lesions, without obvious abnormality.  Normal external eye and conjunctiva.  Normal TM's bilaterally.  Normal auditory canals and external ears. Normal external nose, mucus membranes and septum.  Normal pharynx.    Neuro:  Mental Status:  Patient is alert, initially he thought he was at a training camp but when looking around the room using, he is at the hospital. He is unable to tell me the date, month, or year. He is able to follow commands. He is able to hold a conversation but again it is noticeable that he is slightly confused but less so than yesterday. CN: Pupils are equal and round. They are symmetrically reactive from 3-->2 mm. EOMI without nystagmus. Facial sensation is intact to light touch. Face is symmetric at rest with normal strength and mobility. Hearing is intact to conversational voice. Palate elevates symmetrically and uvula is midline. Voice is normal in tone, pitch and quality. Bilateral SCM and trapezii are 5/5. Tongue is midline with normal bulk and mobility.  Cranial Nerves: II: Visual fields grossly normal,  III,IV, VI: ptosis not present, extra-ocular motions intact bilaterally pupils equal, round, reactive to light and accommodation--patient has pain that is subjected with eye movements V,VII: smile symmetric, facial light touch sensation normal bilaterally VIII: hearing normal bilaterally IX,X: uvula rises symmetrically XI: bilateral shoulder shrug XII: midline tongue extension Motor: Patient is able to move all extremities antigravity with upper extremity is greater than  lower extremities. He does have a tremor with upper extremities but no asterixis Sensory: Bilateral lower extremity decreased sensation to pinprick light touch and vibration up to his knee Deep Tendon Reflexes: Patient has brisk with hyperreflexia at bilateral knees and ankles with clonus in his left foot. Upper extremities are decreased with 1+. Plantars: Right: downgoing                                Left: downgoing Cerebellar: normal finger-to-nose,  Gait: Not tested  Pertinent Labs/Diagnostics: CT head and neck showed no acute abnormality Cr 2.20 down from 3.53 Ammonia 16   Etta Quill PA-C [facility] [phone]  Impression:  56 year old male with fall which I suspect was due to asterixis. His asterixis is improved, and his encephalopathy is improving. This is consistent with metabolic encephalopathy secondary to renal failure and likely gabapentin toxicity as well. His rhabdomyolysis is not so high that I would definitely think that this was the cause of his kidney injury, but he certainly could've had a higher CT at home. With a continued to trend down, continue suspect traumatic calls but given that I think that the asterixis caused his fall, not sure which came first.  Recommendations: 1) would continue to hold gabapentin pending improvement, then restart at a renally appropriate dose 2) no further recommendations for workup at this time. Neurology will sign off, please call if further questions or concerns.  Roland Rack, MD [facility] [phone]  If 7pm- 7am, please page neurology on call as listed in [place].   09/12/2016,  10:31 AM

## 2016-09-12 NOTE — Progress Notes (Signed)
Pt is still very confused and agitated post Haldol and Trazodone. NP on call was paged if he have something. Will administer and continue to monitor.

## 2016-09-12 NOTE — Progress Notes (Signed)
Came by to start peripheral IV.   Pt stating,  "I don't need an IV and all that dope y'all want give me"   He refused to let me put a tourniquet on and assess for an IV.  Primary RN made aware.

## 2016-09-12 NOTE — Progress Notes (Signed)
[facility] ROUNDING NOTE   Subjective:   Interval History:56 year old Caucasian man with past medical history significant for morbid obesity, diastolic dysfunction with CHF, OSA/OHS, chronic back pain/multifocal pain, hypertension, hepatitis C infection status post treatment and what appears to be normal renal function at baseline with a creatinine of 1.1 back in February, 2018. He was brought to the emergency room with increasing falls and numbness of his legs this morning. Per the patient, he had some nausea, vomiting and diarrhea over the weekend and reports have been compliantly taking his lisinopril and HCTZ. Over the past few days, has also been taking increasing amounts of ibuprofen for aches and pains along with aspirin. He denies any chest pain or shortness of breath and denies any fever or chills. CPK was elevated 3010   Objective:  Vital signs in last 24 hours:  Temp:  [98.4 F (36.9 C)-99.1 F (37.3 C)] 98.4 F (36.9 C) (05/23 0501) Pulse Rate:  [91-98] 91 (05/23 0501) Resp:  [13-20] 20 (05/23 0501) BP: (98-119)/(49-96) 117/60 (05/23 0501) SpO2:  [95 %-100 %] 98 % (05/23 0828)  Weight change:  There were no vitals filed for this visit.  Intake/Output: I/O last 3 completed shifts: In: 1065 [I.V.:1065] Out: 3075 [Urine:3075]   Intake/Output this shift:  No intake/output data recorded.  CVS- RRR RS- CTA ABD- BS present soft non-distended EXT- no edema   Basic Metabolic Panel:  Recent Labs Lab 09/11/16 1435 09/11/16 1803 09/12/16 0359  NA 132*  --  135  K 5.4*  --  4.5  CL 100*  --  103  CO2 22  --  23  GLUCOSE 96  --  98  BUN 82*  --  57*  CREATININE 4.30* 3.53* 2.20*  CALCIUM 8.2*  --  8.3*    Liver Function Tests:  Recent Labs Lab 09/11/16 1435 09/12/16 0359  AST 63* 51*  ALT 28 27  ALKPHOS 111 110  BILITOT 0.7 0.6  PROT 6.7 6.7  ALBUMIN 3.1* 2.8*   No results for input(s): LIPASE, AMYLASE in the last 168 hours.  Recent  Labs Lab 09/11/16 1901  AMMONIA 16    CBC:  Recent Labs Lab 09/11/16 1435 09/12/16 0359  WBC 11.9* 8.3  NEUTROABS 9.7*  --   HGB 10.0* 9.6*  HCT 31.6* 30.4*  MCV 81.7 81.7  PLT 244 243    Cardiac Enzymes:  Recent Labs Lab 09/11/16 1435 09/11/16 2147 09/12/16 0359  CKTOTAL 3,010* 2,402* 2,000*    BNP: Invalid input(s): POCBNP  CBG: No results for input(s): GLUCAP in the last 168 hours.  Microbiology: Results for orders placed or performed in visit on 01/27/15  Throat culture Randell Loop)     Status: None   Collection Time: 01/27/15 10:23 AM  Result Value Ref Range Status   Organism ID, Bacteria Normal Upper Respiratory Flora  Final   Organism ID, Bacteria No Beta Hemolytic Streptococci Isolated  Final    Coagulation Studies: No results for input(s): LABPROT, INR in the last 72 hours.  Urinalysis:  Recent Labs  09/11/16 1405  COLORURINE YELLOW  LABSPEC 1.012  PHURINE 5.0  GLUCOSEU NEGATIVE  HGBUR MODERATE*  BILIRUBINUR NEGATIVE  KETONESUR NEGATIVE  PROTEINUR NEGATIVE  NITRITE NEGATIVE  LEUKOCYTESUR NEGATIVE      Imaging: Dg Lumbar Spine Complete  Result Date: 09/11/2016 CLINICAL DATA:  Recent falls with lower back pain EXAM: LUMBAR SPINE - COMPLETE 4+ VIEW COMPARISON:  None. FINDINGS: Five lumbar type vertebral bodies are well visualized. Vertebral body  height is well maintained. No pars defects or anterolisthesis is seen. Mild osteophytic changes are noted at thoracolumbar junction. No soft tissue changes are seen. IMPRESSION: No acute abnormality noted. Electronically Signed   By: Inez Catalina M.D.   On: 09/11/2016 13:14   Ct Head Wo Contrast  Result Date: 09/11/2016 CLINICAL DATA:  Multiple recent falls and loss of consciousness. Posterior neck pain. History of hypertension, hepatitis-C. EXAM: CT HEAD WITHOUT CONTRAST CT CERVICAL SPINE WITHOUT CONTRAST TECHNIQUE: Multidetector CT imaging of the head and cervical spine was performed following the  standard protocol without intravenous contrast. Multiplanar CT image reconstructions of the cervical spine were also generated. COMPARISON:  Cervical spine radiographs Aug 24, 2013 and CT cervical spine November 15, 2011 FINDINGS: CT HEAD FINDINGS BRAIN: No intraparenchymal hemorrhage, mass effect nor midline shift. The ventricles and sulci are normal. No acute large vascular territory infarcts. No abnormal extra-axial fluid collections. Basal cisterns are patent. VASCULAR: Unremarkable. SKULL/SOFT TISSUES: No skull fracture. No significant soft tissue swelling. ORBITS/SINUSES: The included ocular globes and orbital contents are normal.Trace paranasal sinus mucosal thickening and scattered subcentimeter mucosal retention cyst. OTHER: None. CT CERVICAL SPINE FINDINGS Large body habitus results in overall noisy image quality. ALIGNMENT: Straightened lordosis. Vertebral bodies in alignment. SKULL BASE AND VERTEBRAE: Cervical vertebral bodies and posterior elements are intact. Intervertebral disc heights preserved bridging ventral osteophytes C4 through C7. Segmental calcification the posterior longitudinal ligament. No destructive bony lesions. C1-2 articulation maintained. Longus coli insertional enthesopathy. SOFT TISSUES AND SPINAL CANAL: Nonacute, limited assessment due to body habitus. DISC LEVELS: Mild canal stenosis C2-3, C4-5, C5-6 and C6-7 due to ossified posterior longitudinal ligament, possible mild canal stenosis at C6-7. No significant osseous neural foraminal narrowing. UPPER CHEST: Lung apices are clear. OTHER: None. IMPRESSION: CT HEAD: Negative noncontrast CT HEAD for age. CT CERVICAL SPINE: Habitus limited examination. No acute fracture or malalignment. Findings of DISH and OPLL. Multilevel mild osseous canal stenosis without significant neural foraminal narrowing. Electronically Signed   By: Elon Alas M.D.   On: 09/11/2016 14:16   Ct Cervical Spine Wo Contrast  Result Date: 09/11/2016 CLINICAL  DATA:  Multiple recent falls and loss of consciousness. Posterior neck pain. History of hypertension, hepatitis-C. EXAM: CT HEAD WITHOUT CONTRAST CT CERVICAL SPINE WITHOUT CONTRAST TECHNIQUE: Multidetector CT imaging of the head and cervical spine was performed following the standard protocol without intravenous contrast. Multiplanar CT image reconstructions of the cervical spine were also generated. COMPARISON:  Cervical spine radiographs Aug 24, 2013 and CT cervical spine November 15, 2011 FINDINGS: CT HEAD FINDINGS BRAIN: No intraparenchymal hemorrhage, mass effect nor midline shift. The ventricles and sulci are normal. No acute large vascular territory infarcts. No abnormal extra-axial fluid collections. Basal cisterns are patent. VASCULAR: Unremarkable. SKULL/SOFT TISSUES: No skull fracture. No significant soft tissue swelling. ORBITS/SINUSES: The included ocular globes and orbital contents are normal.Trace paranasal sinus mucosal thickening and scattered subcentimeter mucosal retention cyst. OTHER: None. CT CERVICAL SPINE FINDINGS Large body habitus results in overall noisy image quality. ALIGNMENT: Straightened lordosis. Vertebral bodies in alignment. SKULL BASE AND VERTEBRAE: Cervical vertebral bodies and posterior elements are intact. Intervertebral disc heights preserved bridging ventral osteophytes C4 through C7. Segmental calcification the posterior longitudinal ligament. No destructive bony lesions. C1-2 articulation maintained. Longus coli insertional enthesopathy. SOFT TISSUES AND SPINAL CANAL: Nonacute, limited assessment due to body habitus. DISC LEVELS: Mild canal stenosis C2-3, C4-5, C5-6 and C6-7 due to ossified posterior longitudinal ligament, possible mild canal stenosis at C6-7. No significant osseous neural  foraminal narrowing. UPPER CHEST: Lung apices are clear. OTHER: None. IMPRESSION: CT HEAD: Negative noncontrast CT HEAD for age. CT CERVICAL SPINE: Habitus limited examination. No acute  fracture or malalignment. Findings of DISH and OPLL. Multilevel mild osseous canal stenosis without significant neural foraminal narrowing. Electronically Signed   By: Elon Alas M.D.   On: 09/11/2016 14:16   US Renal  Result Date: 09/11/2016 CLINICAL DATA:  Acute kidney injury EXAM: RENAL / URINARY TRACT ULTRASOUND COMPLETE COMPARISON:  12/04/2012 FINDINGS: Right Kidney: Length: 12.1 cm. Echogenicity within normal limits. No mass or hydronephrosis visualized. Left Kidney: Length: 12.5 cm. Echogenicity within normal limits. No mass or hydronephrosis visualized. Bladder: Appears normal for degree of bladder distention. IMPRESSION: Negative renal ultrasound Electronically Signed   By: Donavan Foil M.D.   On: 09/11/2016 23:13   Dg Chest Port 1 View  Result Date: 09/11/2016 CLINICAL DATA:  Wheezing EXAM: PORTABLE CHEST 1 VIEW COMPARISON:  06/06/2016 FINDINGS: Hypoventilation with bibasilar atelectasis. Negative for heart failure or effusion IMPRESSION: Hypoventilation with bibasilar atelectasis. Electronically Signed   By: Franchot Gallo M.D.   On: 09/11/2016 20:51     Medications:    . atenolol  50 mg Oral Daily  . benztropine  0.5 mg Oral QHS  . haloperidol lactate      . haloperidol lactate  2 mg Intramuscular Once  . heparin  5,000 Units Subcutaneous Q8H  . lurasidone  120 mg Oral Daily  . minocycline  100 mg Oral TID  . pantoprazole  40 mg Oral Daily  . prednisoLONE acetate  1 drop Both Eyes QID  . rOPINIRole  4 mg Oral TID  . sertraline  50 mg Oral Daily  . sodium chloride flush  3 mL Intravenous Q12H  . sodium polystyrene  30 g Oral Once  . traZODone  50 mg Oral QHS   acetaminophen **OR** acetaminophen, albuterol, cyclobenzaprine, ondansetron **OR** ondansetron (ZOFRAN) IV, oxyCODONE  Assessment/ Plan:  1. Acute kidney injury: History, timeline of events and available database including relative hypotension and physical exam findings suggestive of intravascular volume  contraction (probably amplified by ongoing ACE inhibitor/thiazide use in the face of NSAID use) that likely led to his acute kidney injury as well as mild rhabdomyolysis. Urine analysis does not show findings pointing towards glomerulonephritis and screening for this will be deferred at this time. renal ultrasound unremarkable   Creatinine improved 2. Encephalopathy: Suspected to be pharmacologically induced and further amplified by acute kidney injury-old offensive agents including cyclobenzaprine and gabapentin at this time. Attempt forced diuresis with intravenous fluids. 3. Hyperkalemia  Now 4.5 after kayexalate 4. Hyponatremia: Secondary to free water excretion defect in acute kidney injury-monitor with isotonic fluids. 5. History of diastolic heart failure: Continue close monitoring for any signs of tipping him into volume overload with ongoing intravenous fluids.   LOS: 1 Shametra C[place] W @TODAY @9 :04 AM

## 2016-09-13 ENCOUNTER — Encounter (HOSPITAL_COMMUNITY): Payer: Self-pay | Admitting: General Practice

## 2016-09-13 LAB — COMPREHENSIVE METABOLIC PANEL
ALT: 27 U/L (ref 17–63)
AST: 33 U/L (ref 15–41)
Albumin: 2.8 g/dL — ABNORMAL LOW (ref 3.5–5.0)
Alkaline Phosphatase: 90 U/L (ref 38–126)
Anion gap: 8 (ref 5–15)
BILIRUBIN TOTAL: 0.7 mg/dL (ref 0.3–1.2)
BUN: 28 mg/dL — AB (ref 6–20)
CHLORIDE: 106 mmol/L (ref 101–111)
CO2: 25 mmol/L (ref 22–32)
CREATININE: 1.22 mg/dL (ref 0.61–1.24)
Calcium: 8.3 mg/dL — ABNORMAL LOW (ref 8.9–10.3)
GFR calc Af Amer: >60 mL/min (ref >60)
Glucose, Bld: 92 mg/dL (ref 65–99)
Potassium: 4.6 mmol/L (ref 3.5–5.1)
Sodium: 139 mmol/L (ref 135–145)
TOTAL PROTEIN: 6.5 g/dL (ref 6.5–8.1)

## 2016-09-13 LAB — CBC WITH DIFFERENTIAL/PLATELET
BASOS ABS: 0 10*3/uL (ref 0.0–0.1)
Basophils Relative: 0 %
Eosinophils Absolute: 0.2 10*3/uL (ref 0.0–0.7)
Eosinophils Relative: 3 %
HEMATOCRIT: 30.1 % — AB (ref 39.0–52.0)
Hemoglobin: 9.6 g/dL — ABNORMAL LOW (ref 13.0–17.0)
LYMPHS PCT: 17 %
Lymphs Abs: 1.3 10*3/uL (ref 0.7–4.0)
MCH: 26.2 pg (ref 26.0–34.0)
MCHC: 31.9 g/dL (ref 30.0–36.0)
MCV: 82.2 fL (ref 78.0–100.0)
Monocytes Absolute: 0.5 10*3/uL (ref 0.1–1.0)
Monocytes Relative: 7 %
NEUTROS ABS: 5.7 10*3/uL (ref 1.7–7.7)
Neutrophils Relative %: 73 %
Platelets: 252 10*3/uL (ref 150–400)
RBC: 3.66 MIL/uL — AB (ref 4.22–5.81)
RDW: 15.6 % — ABNORMAL HIGH (ref 11.5–15.5)
WBC: 7.8 10*3/uL (ref 4.0–10.5)

## 2016-09-13 LAB — IMMUNOFIXATION, URINE

## 2016-09-13 LAB — UREA NITROGEN, URINE: UREA NITROGEN UR: 556 mg/dL

## 2016-09-13 LAB — MAGNESIUM: MAGNESIUM: 2.1 mg/dL (ref 1.7–2.4)

## 2016-09-13 LAB — CK: Total CK: 827 U/L — ABNORMAL HIGH (ref 49–397)

## 2016-09-13 NOTE — Progress Notes (Signed)
[facility] ROUNDING NOTE   Subjective:   Interval History: Interval History:56 year old Caucasian man with past medical history significant for morbid obesity, diastolic dysfunction with CHF, OSA/OHS, chronic back pain/multifocal pain, hypertension, hepatitis C infection status post treatment and what appears to be normal renal function at baseline with a creatinine of 1.1 back in February, 2018. He was brought to the emergency room with increasing falls and numbness of his legs this morning. Per the patient, he had some nausea, vomiting and diarrhea over the weekend and reports have been compliantly taking his lisinopril and HCTZ. Over the past few days, has also been taking increasing amounts of ibuprofen for aches and pains along with aspirin. He denies any chest pain or shortness of breath and denies any fever or chills. CPK was elevated 3010   Creatinine has improved to baseline   Objective:  Vital signs in last 24 hours:  Temp:  [98 F (36.7 C)-98.4 F (36.9 C)] 98 F (36.7 C) (05/24 0506) Pulse Rate:  [85-95] 95 (05/24 0840) Resp:  [19-20] 20 (05/24 0506) BP: (118-184)/(54-139) 153/118 (05/24 0840) SpO2:  [99 %-100 %] 99 % (05/24 0508) Weight:  [385 lb (174.6 kg)-388 lb 8 oz (176.2 kg)] 385 lb (174.6 kg) (05/24 0506)  Weight change:  Filed Weights   09/12/16 1452 09/13/16 0500 09/13/16 0506  Weight: (!) 388 lb 8 oz (176.2 kg) (!) 385 lb 1.6 oz (174.7 kg) (!) 385 lb (174.6 kg)    Intake/Output: I/O last 3 completed shifts: In: 2837.5 [P.O.:90; I.V.:2747.5] Out: 2650 [Urine:2650]   Intake/Output this shift:  No intake/output data recorded.  CVS- RRR RS- CTA ABD- BS present soft non-distended EXT- no edema   Basic Metabolic Panel:  Recent Labs Lab 09/11/16 1435 09/11/16 1803 09/12/16 0359 09/13/16 0454  NA 132*  --  135 139  K 5.4*  --  4.5 4.6  CL 100*  --  103 106  CO2 22  --  23 25  GLUCOSE 96  --  98 92  BUN 82*  --  57* 28*  CREATININE  4.30* 3.53* 2.20* 1.22  CALCIUM 8.2*  --  8.3* 8.3*  MG  --   --   --  2.1    Liver Function Tests:  Recent Labs Lab 09/11/16 1435 09/12/16 0359 09/13/16 0454  AST 63* 51* 33  ALT 28 27 27   ALKPHOS 111 110 90  BILITOT 0.7 0.6 0.7  PROT 6.7 6.7 6.5  ALBUMIN 3.1* 2.8* 2.8*   No results for input(s): LIPASE, AMYLASE in the last 168 hours.  Recent Labs Lab 09/11/16 1901  AMMONIA 16    CBC:  Recent Labs Lab 09/11/16 1435 09/11/16 1803 09/12/16 0359 09/13/16 0454  WBC 11.9*  --  8.3 7.8  NEUTROABS 9.7*  --   --  5.7  HGB 10.0*  --  9.6* 9.6*  HCT 31.6* 29.9* 30.4* 30.1*  MCV 81.7  --  81.7 82.2  PLT 244  --  243 252    Cardiac Enzymes:  Recent Labs Lab 09/11/16 1435 09/11/16 2147 09/12/16 0359 09/13/16 0454  CKTOTAL 3,010* 2,402* 2,000* 827*    BNP: Invalid input(s): POCBNP  CBG: No results for input(s): GLUCAP in the last 168 hours.  Microbiology: Results for orders placed or performed in visit on 01/27/15  Throat culture Randell Loop)     Status: None   Collection Time: 01/27/15 10:23 AM  Result Value Ref Range Status   Organism ID, Bacteria Normal Upper Respiratory Flora  Final   Organism ID, Bacteria No Beta Hemolytic Streptococci Isolated  Final    Coagulation Studies: No results for input(s): LABPROT, INR in the last 72 hours.  Urinalysis:  Recent Labs  09/11/16 1405  COLORURINE YELLOW  LABSPEC 1.012  PHURINE 5.0  GLUCOSEU NEGATIVE  HGBUR MODERATE*  BILIRUBINUR NEGATIVE  KETONESUR NEGATIVE  PROTEINUR NEGATIVE  NITRITE NEGATIVE  LEUKOCYTESUR NEGATIVE      Imaging: Dg Lumbar Spine Complete  Result Date: 09/11/2016 CLINICAL DATA:  Recent falls with lower back pain EXAM: LUMBAR SPINE - COMPLETE 4+ VIEW COMPARISON:  None. FINDINGS: Five lumbar type vertebral bodies are well visualized. Vertebral body height is well maintained. No pars defects or anterolisthesis is seen. Mild osteophytic changes are noted at thoracolumbar junction. No  soft tissue changes are seen. IMPRESSION: No acute abnormality noted. Electronically Signed   By: Inez Catalina M.D.   On: 09/11/2016 13:14   Ct Head Wo Contrast  Result Date: 09/11/2016 CLINICAL DATA:  Multiple recent falls and loss of consciousness. Posterior neck pain. History of hypertension, hepatitis-C. EXAM: CT HEAD WITHOUT CONTRAST CT CERVICAL SPINE WITHOUT CONTRAST TECHNIQUE: Multidetector CT imaging of the head and cervical spine was performed following the standard protocol without intravenous contrast. Multiplanar CT image reconstructions of the cervical spine were also generated. COMPARISON:  Cervical spine radiographs Aug 24, 2013 and CT cervical spine November 15, 2011 FINDINGS: CT HEAD FINDINGS BRAIN: No intraparenchymal hemorrhage, mass effect nor midline shift. The ventricles and sulci are normal. No acute large vascular territory infarcts. No abnormal extra-axial fluid collections. Basal cisterns are patent. VASCULAR: Unremarkable. SKULL/SOFT TISSUES: No skull fracture. No significant soft tissue swelling. ORBITS/SINUSES: The included ocular globes and orbital contents are normal.Trace paranasal sinus mucosal thickening and scattered subcentimeter mucosal retention cyst. OTHER: None. CT CERVICAL SPINE FINDINGS Large body habitus results in overall noisy image quality. ALIGNMENT: Straightened lordosis. Vertebral bodies in alignment. SKULL BASE AND VERTEBRAE: Cervical vertebral bodies and posterior elements are intact. Intervertebral disc heights preserved bridging ventral osteophytes C4 through C7. Segmental calcification the posterior longitudinal ligament. No destructive bony lesions. C1-2 articulation maintained. Longus coli insertional enthesopathy. SOFT TISSUES AND SPINAL CANAL: Nonacute, limited assessment due to body habitus. DISC LEVELS: Mild canal stenosis C2-3, C4-5, C5-6 and C6-7 due to ossified posterior longitudinal ligament, possible mild canal stenosis at C6-7. No significant osseous  neural foraminal narrowing. UPPER CHEST: Lung apices are clear. OTHER: None. IMPRESSION: CT HEAD: Negative noncontrast CT HEAD for age. CT CERVICAL SPINE: Habitus limited examination. No acute fracture or malalignment. Findings of DISH and OPLL. Multilevel mild osseous canal stenosis without significant neural foraminal narrowing. Electronically Signed   By: Elon Alas M.D.   On: 09/11/2016 14:16   Ct Cervical Spine Wo Contrast  Result Date: 09/11/2016 CLINICAL DATA:  Multiple recent falls and loss of consciousness. Posterior neck pain. History of hypertension, hepatitis-C. EXAM: CT HEAD WITHOUT CONTRAST CT CERVICAL SPINE WITHOUT CONTRAST TECHNIQUE: Multidetector CT imaging of the head and cervical spine was performed following the standard protocol without intravenous contrast. Multiplanar CT image reconstructions of the cervical spine were also generated. COMPARISON:  Cervical spine radiographs Aug 24, 2013 and CT cervical spine November 15, 2011 FINDINGS: CT HEAD FINDINGS BRAIN: No intraparenchymal hemorrhage, mass effect nor midline shift. The ventricles and sulci are normal. No acute large vascular territory infarcts. No abnormal extra-axial fluid collections. Basal cisterns are patent. VASCULAR: Unremarkable. SKULL/SOFT TISSUES: No skull fracture. No significant soft tissue swelling. ORBITS/SINUSES: The included ocular globes and orbital contents  are normal.Trace paranasal sinus mucosal thickening and scattered subcentimeter mucosal retention cyst. OTHER: None. CT CERVICAL SPINE FINDINGS Large body habitus results in overall noisy image quality. ALIGNMENT: Straightened lordosis. Vertebral bodies in alignment. SKULL BASE AND VERTEBRAE: Cervical vertebral bodies and posterior elements are intact. Intervertebral disc heights preserved bridging ventral osteophytes C4 through C7. Segmental calcification the posterior longitudinal ligament. No destructive bony lesions. C1-2 articulation maintained. Longus coli  insertional enthesopathy. SOFT TISSUES AND SPINAL CANAL: Nonacute, limited assessment due to body habitus. DISC LEVELS: Mild canal stenosis C2-3, C4-5, C5-6 and C6-7 due to ossified posterior longitudinal ligament, possible mild canal stenosis at C6-7. No significant osseous neural foraminal narrowing. UPPER CHEST: Lung apices are clear. OTHER: None. IMPRESSION: CT HEAD: Negative noncontrast CT HEAD for age. CT CERVICAL SPINE: Habitus limited examination. No acute fracture or malalignment. Findings of DISH and OPLL. Multilevel mild osseous canal stenosis without significant neural foraminal narrowing. Electronically Signed   By: Elon Alas M.D.   On: 09/11/2016 14:16   US Renal  Result Date: 09/11/2016 CLINICAL DATA:  Acute kidney injury EXAM: RENAL / URINARY TRACT ULTRASOUND COMPLETE COMPARISON:  12/04/2012 FINDINGS: Right Kidney: Length: 12.1 cm. Echogenicity within normal limits. No mass or hydronephrosis visualized. Left Kidney: Length: 12.5 cm. Echogenicity within normal limits. No mass or hydronephrosis visualized. Bladder: Appears normal for degree of bladder distention. IMPRESSION: Negative renal ultrasound Electronically Signed   By: Donavan Foil M.D.   On: 09/11/2016 23:13   Dg Chest Port 1 View  Result Date: 09/11/2016 CLINICAL DATA:  Wheezing EXAM: PORTABLE CHEST 1 VIEW COMPARISON:  06/06/2016 FINDINGS: Hypoventilation with bibasilar atelectasis. Negative for heart failure or effusion IMPRESSION: Hypoventilation with bibasilar atelectasis. Electronically Signed   By: Franchot Gallo M.D.   On: 09/11/2016 20:51     Medications:   . sodium chloride 150 mL/hr at 09/13/16 0602   . atenolol  50 mg Oral Daily  . benztropine  0.5 mg Oral QHS  . heparin  5,000 Units Subcutaneous Q8H  . lurasidone  120 mg Oral Daily  . minocycline  100 mg Oral TID  . pantoprazole  40 mg Oral Daily  . prednisoLONE acetate  1 drop Both Eyes QID  . rOPINIRole  4 mg Oral TID  . sodium chloride flush  3  mL Intravenous Q12H  . traZODone  50 mg Oral QHS   acetaminophen **OR** acetaminophen, albuterol, cyclobenzaprine, ondansetron **OR** ondansetron (ZOFRAN) IV, oxyCODONE  Assessment/ Plan:  1. Acute kidney injury:History, timeline of events and available database including relative hypotension and physical exam findings suggestive of intravascular volume contraction (probably amplified by ongoing ACE inhibitor/thiazide use in the face of NSAID use) that likely led to his acute kidney injury as well as mild rhabdomyolysis. Urine analysis does not show findings pointing towards glomerulonephritis and screening for this will be deferred at this time. renal ultrasound unremarkable   Creatinine improved  Will sign off  2. Encephalopathy:Suspected to be pharmacologically induced and further amplified by acute kidney injury-old offensive agents including cyclobenzaprine and gabapentin at this time. Attempt forced diuresis with intravenous fluids. 3. Hyperkalemia  Now 4.5 after kayexalate   4. Hyponatremia: Secondary to free water excretion defect in acute kidney injury-monitor with isotonic fluids. 5. History of diastolic heart failure:Continue close monitoring for any signs of tipping him into volume overload with ongoing intravenous fluids.    LOS: 2 Corinna Burkman W @TODAY @9 :09 AM

## 2016-09-13 NOTE — Care Management Note (Addendum)
Case Management Note  Patient Details  Name: Mohit Zirbes MRN: [id number] Date of Birth: 04/22/1961  Subjective/Objective:          Admitted with acute encephalopathy/ AKI, hx of morbid obesity, diastolic dysfunction/CHF, OSA/OHS, chronic back pain/multifocal pain, hypertension, hepatitis C. From home with father. Pt is without insurance however active with [facility]. Father states pt goes to [place] once a month to pick up ? Psych medication.  Herbie [place] Peter Garter (Father) Avyukt Cimo (Mother)    [phone]     PCP:  Fredia Beets NP [facility]  Post hospital follow up scheduled for 10/04/2016 @ 10AM with Fredia Beets NP  Action/Plan: Neuro following....gabapentin washout , if no significant improvement by tomorrow MD to consult psych. CM to f/u with disposition needs.  Expected Discharge Date:                  Expected Discharge Plan:  Home/Self Care  In-House Referral:     Discharge planning Services  CM Consult  Post Acute Care Choice:    Choice offered to:     DME Arranged:    DME Agency:     HH Arranged:    HH Agency:     Status of Service:  In process, will continue to follow  If discussed at Long Length of Stay Meetings, dates discussed:    Additional Comments:  Sharin Mons, RN 09/13/2016, 2:41 PM

## 2016-09-13 NOTE — Progress Notes (Signed)
[facility] Progress Note  Patient: Christian Guerra KZL:[id number]   PCP: [facility] DOB: January 27, 1961   DOA: 09/11/2016   DOS: 09/13/2016   Date of Service: the patient was seen and examined on 09/13/2016  Subjective: still somewhat confused.No acute events. Although no evidence of agitation overnight as well.  Brief hospital course: Pt. with PMH of morbid obesity, chronic pain syndrome, recurrent fall, always say, chronic diastolic CHF, mood disorder; admitted on 09/11/2016, presented with complaint of confusion, was found to have acute kidney injury and acute encephalopathy. Currently further plan is for monitor for improvement in encephalopathy  Assessment and Plan: 1. Acute encephalopathy. CT of the head without acute abnormality. No focal deficits. MRI ordered by neurology patient unable to lay still. Likely related to metabolic derangements in setting of acute renal failure and rhadomylosis - Hold nephrotoxins - unremarkable renal ultrasound - Monitor urine output - Decrease sedating medications - Discussed with neurology, will currently continue to hold psychotropic medication and hope for recovery. If no improvement patient will require psychiatry input for further treatment.  2. Acute kidney injury. Traumatic rhabdomyolysis  Home medications include lisinopril hydrochlorothiazide. - Significantly better, nephrology currently signed off. - Hold nephrotoxins - Monitor urine output - Nephrology consult appreciated,   3. Hypertension. Blood pressure fairly soft in the emergency department. Home medications include atenolol. -Hold lisinopril and hydrochlorothiazide -resume atenolol  4. Chronic pain. Patient with a history of low back pain. Home medications include, Neurontin, Flexeril, Roxicodone IR, Naprosyn, Norco -Decrease sedating medications -Physical therapy -Monitor  5. Obstructive sleep apnea. Patient refuses C Pap according to chart. Monitor.  6.  Obesity BMI Greater than 38 -nutritional consult  7.chronic Diastolic dysfunction. Echo in December 2017 with an EF 55-60% mild LVH grade 2 diastolic dysfunction.  medications include hydrochlorothiazide and lisinopril as well as atenolol -Monitor intake and output -Obtain daily weights -Continue beta blocker holding IV fluids at present. Monitor closely.   Diet: renal diet DVT Prophylaxis: subcutaneous Heparin  Advance goals of care discussion: full code  Family Communication: no family was present at bedside, at the time of interview.   Disposition:  Discharge to be decided.  Consultants: nephrology, neurology Procedures: none  Antibiotics: Anti-infectives    Start     Dose/Rate Route Frequency Ordered Stop   09/11/16 2200  minocycline (MINOCIN,DYNACIN) capsule 100 mg     100 mg Oral 3 times daily 09/11/16 1646         Objective: Physical Exam: Vitals:   09/13/16 0506 09/13/16 0508 09/13/16 0840 09/13/16 1359  BP: (!) 184/139 130/68 (!) 153/118 (!) 140/55  Pulse: 92 94 95 78  Resp: 20   20  Temp: 98 F (36.7 C)   98.7 F (37.1 C)  TempSrc: Oral   Oral  SpO2: 99% 99%  99%  Weight: (!) 174.6 kg (385 lb)       Intake/Output Summary (Last 24 hours) at 09/13/16 1910 Last data filed at 09/13/16 1531  Gross per 24 hour  Intake           2132.5 ml  Output              700 ml  Net           1432.5 ml   Filed Weights   09/12/16 1452 09/13/16 0500 09/13/16 0506  Weight: (!) 176.2 kg (388 lb 8 oz) (!) 174.7 kg (385 lb 1.6 oz) (!) 174.6 kg (385 lb)   General: Alert, Awake and Oriented to Person.  Appear in moderate distress, affect appropriate Eyes: PERRL, Conjunctiva normal ENT: Oral Mucosa clear moist. Neck: difficult to assess JVD, no Abnormal Mass Or lumps Cardiovascular: S1 and S2 Present, no Murmur, Respiratory: Bilateral Air entry equal and Decreased, no use of accessory muscle, Clear to Auscultation, no Crackles, no wheezes Abdomen: Bowel Sound present,  Soft and no tenderness Skin: no redness, no Rash, no induration Extremities: trace Pedal edema, no calf tenderness Neurologic: Grossly no focal neuro deficit. Bilaterally Equal motor strength  Data Reviewed: CBC:  Recent Labs Lab 09/11/16 1435 09/11/16 1803 09/12/16 0359 09/13/16 0454  WBC 11.9*  --  8.3 7.8  NEUTROABS 9.7*  --   --  5.7  HGB 10.0*  --  9.6* 9.6*  HCT 31.6* 29.9* 30.4* 30.1*  MCV 81.7  --  81.7 82.2  PLT 244  --  243 330   Basic Metabolic Panel:  Recent Labs Lab 09/11/16 1435 09/11/16 1803 09/12/16 0359 09/13/16 0454  NA 132*  --  135 139  K 5.4*  --  4.5 4.6  CL 100*  --  103 106  CO2 22  --  23 25  GLUCOSE 96  --  98 92  BUN 82*  --  57* 28*  CREATININE 4.30* 3.53* 2.20* 1.22  CALCIUM 8.2*  --  8.3* 8.3*  MG  --   --   --  2.1    Liver Function Tests:  Recent Labs Lab 09/11/16 1435 09/12/16 0359 09/13/16 0454  AST 63* 51* 33  ALT 28 27 27   ALKPHOS 111 110 90  BILITOT 0.7 0.6 0.7  PROT 6.7 6.7 6.5  ALBUMIN 3.1* 2.8* 2.8*   No results for input(s): LIPASE, AMYLASE in the last 168 hours.  Recent Labs Lab 09/11/16 1901  AMMONIA 16   Coagulation Profile: No results for input(s): INR, PROTIME in the last 168 hours. Cardiac Enzymes:  Recent Labs Lab 09/11/16 1435 09/11/16 2147 09/12/16 0359 09/13/16 0454  CKTOTAL 3,010* 2,402* 2,000* 827*   BNP (last 3 results) No results for input(s): PROBNP in the last 8760 hours. CBG: No results for input(s): GLUCAP in the last 168 hours. Studies: No results found.  Scheduled Meds: . atenolol  50 mg Oral Daily  . benztropine  0.5 mg Oral QHS  . heparin  5,000 Units Subcutaneous Q8H  . lurasidone  120 mg Oral Daily  . minocycline  100 mg Oral TID  . pantoprazole  40 mg Oral Daily  . prednisoLONE acetate  1 drop Both Eyes QID  . rOPINIRole  4 mg Oral TID  . sodium chloride flush  3 mL Intravenous Q12H   Continuous Infusions:  PRN Meds: acetaminophen **OR** acetaminophen,  albuterol, cyclobenzaprine, ondansetron **OR** ondansetron (ZOFRAN) IV, oxyCODONE  Time spent: 30 minutes  Author: Berle Mull, MD [facility] Pager: [phone] 09/13/2016 7:10 PM  If 7PM-7AM, please contact night-coverage at [email or web address], password [facility]

## 2016-09-14 ENCOUNTER — Inpatient Hospital Stay (HOSPITAL_COMMUNITY): Payer: Self-pay

## 2016-09-14 LAB — CK: Total CK: 367 U/L (ref 49–397)

## 2016-09-14 LAB — RENAL FUNCTION PANEL
ANION GAP: 8 (ref 5–15)
Albumin: 2.8 g/dL — ABNORMAL LOW (ref 3.5–5.0)
BUN: 19 mg/dL (ref 6–20)
CALCIUM: 8.7 mg/dL — AB (ref 8.9–10.3)
CHLORIDE: 104 mmol/L (ref 101–111)
CO2: 27 mmol/L (ref 22–32)
CREATININE: 1.14 mg/dL (ref 0.61–1.24)
Glucose, Bld: 87 mg/dL (ref 65–99)
Phosphorus: 2.5 mg/dL (ref 2.5–4.6)
Potassium: 4.8 mmol/L (ref 3.5–5.1)
SODIUM: 139 mmol/L (ref 135–145)

## 2016-09-14 LAB — TSH: TSH: 1.395 u[IU]/mL (ref 0.350–4.500)

## 2016-09-14 LAB — VITAMIN B12: VITAMIN B 12: 1270 pg/mL — AB (ref 180–914)

## 2016-09-14 LAB — T4, FREE: FREE T4: 0.95 ng/dL (ref 0.61–1.12)

## 2016-09-14 MED ORDER — PHENOL 1.4 % MT LIQD
1.0000 | OROMUCOSAL | Status: DC | PRN
  Administered 2016-09-14: 1 via OROMUCOSAL
  Filled 2016-09-14: qty 177

## 2016-09-14 MED ORDER — GUAIFENESIN 100 MG/5ML PO SOLN
5.0000 mL | ORAL | Status: DC | PRN
  Administered 2016-09-14: 100 mg via ORAL
  Filled 2016-09-14: qty 5

## 2016-09-14 MED ORDER — FUROSEMIDE 20 MG PO TABS
20.0000 mg | ORAL_TABLET | Freq: Once | ORAL | Status: AC
  Administered 2016-09-14: 20 mg via ORAL
  Filled 2016-09-14: qty 1

## 2016-09-14 NOTE — Evaluation (Signed)
Physical Therapy Evaluation Patient Details Name: Christian Guerra MRN: [id number] DOB: [birth year] Today's Date: 09/14/2016   History of Present Illness  Pt is a 56 yo male admitted through ED on 09/11/16 following two falls in 2 days with increased confusion. Pt was diagnose with acute encephalopathy related to a AKI and ARF possibly due to overload of multiple narcotic pain medications. PMH significant for Hep C, Chronic back pain, GERD, HTN, OSA, generalized anxiety, CHF, Morbid obesity.   Clinical Impression  Pt presents with the above diagnosis and below deficits for therapy evaluation. Prior to admission, pt was completely independent and mostly sedentary due to chronic back pain and morbid obesity. Pt was able to do for himself including performing all ADLs and IADLs. Currently, pt is min guard for mobility without a RW and supervision to Mod I with a RW. Pt would benefit from a Rollator at home in order to improve his endurance and ability to progress his functional mobility at home. Advised pt to attempt to increased walking slowly when he returns home and progress as tolerated. Pt will benefit form an additional therapy visit before discharge in order to instruct on use of rollator and ensure safety with stair negotiaiton.     Follow Up Recommendations No PT follow up    Equipment Recommendations  Other (comment);3in1 (PT) (4 Wheeled Rolling walker, bariatric)    Recommendations for Other Services       Precautions / Restrictions Precautions Precautions: Fall Restrictions Weight Bearing Restrictions: No      Mobility  Bed Mobility Overal bed mobility: Modified Independent             General bed mobility comments: sitting in recliner when PT arrives  Transfers Overall transfer level: Needs assistance Equipment used: None;Rolling walker (2 wheeled) Transfers: Sit to/from Stand Sit to Stand: Min guard         General transfer comment: Min guard for safety from recliner    Ambulation/Gait Ambulation/Gait assistance: Supervision;Min guard Ambulation Distance (Feet): 150 Feet (75x2) Assistive device: Rolling walker (2 wheeled);None Gait Pattern/deviations: Step-through pattern;Decreased step length - right;Decreased step length - left;Antalgic Gait velocity: decreased Gait velocity interpretation: Below normal speed for age/gender General Gait Details: Mild antalgic gait without an AD due to back pain. Improved gait pattern and sequencing with RW.   Stairs            Wheelchair Mobility    Modified Rankin (Stroke Patients Only)       Balance Overall balance assessment: Needs assistance Sitting-balance support: Feet supported;No upper extremity supported Sitting balance-Leahy Scale: Good     Standing balance support: No upper extremity supported;During functional activity Standing balance-Leahy Scale: Fair                               Pertinent Vitals/Pain Pain Assessment: Faces Faces Pain Scale: Hurts little more Pain Location: lumbar region with gait Pain Descriptors / Indicators: Grimacing;Guarding Pain Intervention(s): Monitored during session;Repositioned    Home Living Family/patient expects to be discharged to:: Private residence Living Arrangements: Parent Available Help at Discharge: Family;Available PRN/intermittently Type of Home: House Home Access: Stairs to enter Entrance Stairs-Rails: None Entrance Stairs-Number of Steps: 3 Home Layout: One level Home Equipment: None      Prior Function Level of Independence: Independent         Comments: was completely independent prior to admission     Hand Dominance   Dominant Hand: Right  Extremity/Trunk Assessment   Upper Extremity Assessment Upper Extremity Assessment: Overall WFL for tasks assessed    Lower Extremity Assessment Lower Extremity Assessment: Generalized weakness    Cervical / Trunk Assessment Cervical / Trunk Assessment:  Normal  Communication   Communication: No difficulties  Cognition Arousal/Alertness: Awake/alert Behavior During Therapy: WFL for tasks assessed/performed Overall Cognitive Status: Within Functional Limits for tasks assessed                                        General Comments      Exercises     Assessment/Plan    PT Assessment Patient needs continued PT services  PT Problem List Decreased strength;Decreased activity tolerance;Decreased balance;Decreased mobility;Decreased knowledge of use of DME;Obesity       PT Treatment Interventions DME instruction;Gait training;Stair training;Functional mobility training;Therapeutic activities;Therapeutic exercise;Balance training;Patient/family education    PT Goals (Current goals can be found in the Care Plan section)  Acute Rehab PT Goals Patient Stated Goal: to be able to move around better PT Goal Formulation: With patient Time For Goal Achievement: 09/28/16 Potential to Achieve Goals: Good    Frequency Min 3X/week   Barriers to discharge        Co-evaluation               AM-PAC PT "6 Clicks" Daily Activity  Outcome Measure Difficulty turning over in bed (including adjusting bedclothes, sheets and blankets)?: None Difficulty moving from lying on back to sitting on the side of the bed? : None Difficulty sitting down on and standing up from a chair with arms (e.g., wheelchair, bedside commode, etc,.)?: A Little Help needed moving to and from a bed to chair (including a wheelchair)?: A Little Help needed walking in hospital room?: A Little Help needed climbing 3-5 steps with a railing? : A Little 6 Click Score: 20    End of Session Equipment Utilized During Treatment: Gait belt Activity Tolerance: Patient tolerated treatment well Patient left: in chair;with call bell/phone within reach;with family/visitor present Nurse Communication: Mobility status PT Visit Diagnosis: Difficulty in walking, not  elsewhere classified (R26.2)    Time: 3646-8032 PT Time Calculation (min) (ACUTE ONLY): 40 min   Charges:   PT Evaluation $PT Eval Moderate Complexity: 1 Procedure PT Treatments $Gait Training: 23-37 mins   PT G Codes:        Scheryl Marten PT, DPT  [phone]   Jacqulyn Liner Sloan Leiter 09/14/2016, 12:37 PM

## 2016-09-14 NOTE — Progress Notes (Signed)
[facility] Progress Note  Patient: Christian Guerra UEA:[id number]   PCP: [facility] DOB: 11/11/1960   DOA: 09/11/2016   DOS: 09/14/2016   Date of Service: the patient was seen and examined on 09/14/2016  Subjective: Shortness of breath this morning. No chest pain no nausea no vomiting. No cough no fever no chills associated with that. She'll resume it is primarily or lying down. Also has some swelling of the leg..  Brief hospital course: Pt. with PMH of morbid obesity, chronic pain syndrome, recurrent fall, always say, chronic diastolic CHF, mood disorder; admitted on 09/11/2016, presented with complaint of confusion, was found to have acute kidney injury and acute encephalopathy. Currently further plan is for monitor for improvement in shortness of breath  Assessment and Plan: 1. Acute encephalopathy. CT of the head without acute abnormality. No focal deficits. MRI ordered by neurology patient unable to lay still. Likely related to metabolic derangements in setting of acute renal failure and rhadomylosis - Hold nephrotoxins - unremarkable renal ultrasound - Monitor urine output - Decrease sedating medications Significant improvement in mentation, PT recommends no requirement for physical therapy and discharge.  2. Acute kidney injury. Traumatic rhabdomyolysis  Home medications include lisinopril hydrochlorothiazide. - Significantly better, nephrology currently signed off. - Hold nephrotoxins - Monitor urine output - Nephrology consult appreciated, artery signed off as her renal function has improved. We'll hold lisinopril and HCTZ on discharge.  3. Hypertension. Blood pressure fairly soft in the emergency department. Home medications include atenolol. -Hold lisinopril and hydrochlorothiazide -resume atenolol given one dose of Lasix.  4. Chronic pain. Patient with a history of low back pain. Home medications include, Neurontin, Flexeril, Roxicodone IR, Naprosyn,  Norco -Decrease sedating medications -Physical therapy -Monitor  5. Obstructive sleep apnea. Continue CPAP  6. Obesity BMI Greater than 38 -nutritional consult  7. Chronic Diastolic dysfunction. Echo in December 2017 with an EF 55-60% mild LVH grade 2 diastolic dysfunction.  medications include hydrochlorothiazide and lisinopril as well as atenolol. -Monitor intake and output -Obtain daily weights -Continue beta blocker holding IV fluids at present. Monitor closely.  -Chest x-ray shows no evidence of CHF. 1 dose of Lasix.  Diet: renal diet DVT Prophylaxis: subcutaneous Heparin  Advance goals of care discussion: full code  Family Communication: no family was present at bedside, at the time of interview.   Disposition:  Discharge home likely tomorrow  Consultants: nephrology, neurology Procedures: none  Antibiotics: Anti-infectives    Start     Dose/Rate Route Frequency Ordered Stop   09/11/16 2200  minocycline (MINOCIN,DYNACIN) capsule 100 mg     100 mg Oral 3 times daily 09/11/16 1646         Objective: Physical Exam: Vitals:   09/13/16 2233 09/14/16 0507 09/14/16 0923 09/14/16 1434  BP: 135/63 (!) 124/52 (!) 118/94 (!) 146/82  Pulse: 73 82 79 72  Resp: 20 20    Temp: 98 F (36.7 C) 97.6 F (36.4 C)  98.3 F (36.8 C)  TempSrc: Oral   Oral  SpO2: 99% 100%  100%  Weight:  (!) 173.1 kg (381 lb 9.6 oz)      Intake/Output Summary (Last 24 hours) at 09/14/16 1745 Last data filed at 09/14/16 1125  Gross per 24 hour  Intake              600 ml  Output              500 ml  Net  100 ml   Filed Weights   09/13/16 0500 09/13/16 0506 09/14/16 0507  Weight: (!) 174.7 kg (385 lb 1.6 oz) (!) 174.6 kg (385 lb) (!) 173.1 kg (381 lb 9.6 oz)   General: Alert, Awake and Oriented to Time, Place and Person. Appear in mild distress, affect appropriate Eyes: PERRL, Conjunctiva normal ENT: Oral Mucosa clear moist. Neck: difficult to assess JVD, n Abnormal  Mass Or lumps Cardiovascular: S1 and S2 Present, no Murmur, Peripheral Pulses Present Respiratory: Bilateral Air entry equal and Decreased, no use of accessory muscle, faint basal Crackles, no wheezes Abdomen: Bowel Sound present, Soft and no tenderness Skin: redness no, no Rash, no induration Extremities: trace Pedal edema, no calf tenderness Neurologic: Grossly no focal neuro deficit. Bilaterally Equal motor strength  Data Reviewed: CBC:  Recent Labs Lab 09/11/16 1435 09/11/16 1803 09/12/16 0359 09/13/16 0454  WBC 11.9*  --  8.3 7.8  NEUTROABS 9.7*  --   --  5.7  HGB 10.0*  --  9.6* 9.6*  HCT 31.6* 29.9* 30.4* 30.1*  MCV 81.7  --  81.7 82.2  PLT 244  --  243 161   Basic Metabolic Panel:  Recent Labs Lab 09/11/16 1435 09/11/16 1803 09/12/16 0359 09/13/16 0454 09/14/16 0515  NA 132*  --  135 139 139  K 5.4*  --  4.5 4.6 4.8  CL 100*  --  103 106 104  CO2 22  --  23 25 27   GLUCOSE 96  --  98 92 87  BUN 82*  --  57* 28* 19  CREATININE 4.30* 3.53* 2.20* 1.22 1.14  CALCIUM 8.2*  --  8.3* 8.3* 8.7*  MG  --   --   --  2.1  --   PHOS  --   --   --   --  2.5    Liver Function Tests:  Recent Labs Lab 09/11/16 1435 09/12/16 0359 09/13/16 0454 09/14/16 0515  AST 63* 51* 33  --   ALT 28 27 27   --   ALKPHOS 111 110 90  --   BILITOT 0.7 0.6 0.7  --   PROT 6.7 6.7 6.5  --   ALBUMIN 3.1* 2.8* 2.8* 2.8*   No results for input(s): LIPASE, AMYLASE in the last 168 hours.  Recent Labs Lab 09/11/16 1901  AMMONIA 16   Coagulation Profile: No results for input(s): INR, PROTIME in the last 168 hours. Cardiac Enzymes:  Recent Labs Lab 09/11/16 1435 09/11/16 2147 09/12/16 0359 09/13/16 0454 09/14/16 0514  CKTOTAL 3,010* 2,402* 2,000* 827* 367   BNP (last 3 results) No results for input(s): PROBNP in the last 8760 hours. CBG: No results for input(s): GLUCAP in the last 168 hours. Studies: Dg Chest Port 1 View  Result Date: 09/14/2016 CLINICAL DATA:  Chronic  heart failure.  Shortness of breath. EXAM: PORTABLE CHEST 1 VIEW COMPARISON:  09/11/2016 FINDINGS: Cardiomegaly. Lungs clear. No effusions. No acute bony abnormality. IMPRESSION: Cardiomegaly.  No active disease. Electronically Signed   By: Rolm Baptise M.D.   On: 09/14/2016 09:48    Scheduled Meds: . atenolol  50 mg Oral Daily  . benztropine  0.5 mg Oral QHS  . heparin  5,000 Units Subcutaneous Q8H  . lurasidone  120 mg Oral Daily  . minocycline  100 mg Oral TID  . pantoprazole  40 mg Oral Daily  . prednisoLONE acetate  1 drop Both Eyes QID  . rOPINIRole  4 mg Oral TID  . sodium chloride flush  3 mL Intravenous Q12H   Continuous Infusions:  PRN Meds: acetaminophen **OR** acetaminophen, albuterol, cyclobenzaprine, ondansetron **OR** ondansetron (ZOFRAN) IV, oxyCODONE  Time spent: 30 minutes  Author: Berle Mull, MD [facility] Pager: [phone] 09/14/2016 5:45 PM  If 7PM-7AM, please contact night-coverage at [email or web address], password [facility]

## 2016-09-14 NOTE — Progress Notes (Signed)
Physical Therapy Treatment Note for 09/14/16  Clinical Impression: Pt is making improvements with mobility with 4-wheeled rolling walker. Pt is instructed on proper use including applying brakes when sitting on the device. Pt continues to require additional follow-up to ensure safety with mobility before returning home.     09/14/16 1511  PT Visit Information  Last PT Received On 09/14/16  Assistance Needed +1  History of Present Illness Pt is a 56 yo male admitted through ED on 09/11/16 following two falls in 2 days with increased confusion. Pt was diagnose with acute encephalopathy related to a AKI and ARF possibly due to overload of multiple narcotic pain medications. PMH significant for Hep C, Chronic back pain, GERD, HTN, OSA, generalized anxiety, CHF, Morbid obesity.   Subjective Data  Subjective pt states that he is doing well, but just ate and is really full  Patient Stated Goal to be able to move around better  Precautions  Precautions Fall  Restrictions  Weight Bearing Restrictions No  Pain Assessment  Pain Assessment No/denies pain  Cognition  Arousal/Alertness Awake/alert  Behavior During Therapy [facility] for tasks assessed/performed  Overall Cognitive Status Within Functional Limits for tasks assessed  Bed Mobility  General bed mobility comments sitting in recliner when PT arrives  Transfers  Overall transfer level Needs assistance  Equipment used 4-wheeled walker  Transfers Sit to/from Stand  Sit to Stand Supervision  Ambulation/Gait  Ambulation/Gait assistance Supervision  Ambulation Distance (Feet) 75 Feet  Assistive device 4-wheeled walker  Gait Pattern/deviations Step-through pattern  General Gait Details improved sequencing and gait tolerance with 4 wheeled RW. Instructed pt on proper use of Rollator incluidng applying brakes before sitting and when going down incline surfaces.   Gait velocity decreased  Gait velocity interpretation Below normal speed for age/gender   Balance  Overall balance assessment Needs assistance  Sitting-balance support Feet supported;No upper extremity supported  Sitting balance-Leahy Scale Good  Standing balance support No upper extremity supported;During functional activity  Standing balance-Leahy Scale Fair  PT - End of Session  Equipment Utilized During Treatment Gait belt  Activity Tolerance Patient tolerated treatment well  Patient left in chair;with call bell/phone within reach;with family/visitor present  Nurse Communication Mobility status  PT - Assessment/Plan  PT Plan Current plan remains appropriate  PT Visit Diagnosis Difficulty in walking, not elsewhere classified (R26.2)  PT Frequency (ACUTE ONLY) Min 3X/week  Follow Up Recommendations No PT follow up  PT equipment 3in1 (PT);Other (comment);Rolling walker with 5" wheels (4 wheeled rolling walker, Bariatric equipment)  AM-PAC PT "6 Clicks" Daily Activity Outcome Measure  Difficulty turning over in bed (including adjusting bedclothes, sheets and blankets)? 4  Difficulty moving from lying on back to sitting on the side of the bed?  4  Difficulty sitting down on and standing up from a chair with arms (e.g., wheelchair, bedside commode, etc,.)? 4  Help needed moving to and from a bed to chair (including a wheelchair)? 3  Help needed walking in hospital room? 3  Help needed climbing 3-5 steps with a railing?  3  6 Click Score 21  Mobility G Code  CJ  PT Goal Progression  Progress towards PT goals Progressing toward goals  PT Time Calculation  PT Start Time (ACUTE ONLY) 1347  PT Stop Time (ACUTE ONLY) 1400  PT Time Calculation (min) (ACUTE ONLY) 13 min  PT General Charges  $$ ACUTE PT VISIT 1 Procedure  PT Treatments  $Gait Training 8-22 mins   Scheryl Marten PT,  DPT  [phone]

## 2016-09-15 LAB — RENAL FUNCTION PANEL
ALBUMIN: 2.8 g/dL — AB (ref 3.5–5.0)
Anion gap: 8 (ref 5–15)
BUN: 17 mg/dL (ref 6–20)
CO2: 26 mmol/L (ref 22–32)
CREATININE: 1.3 mg/dL — AB (ref 0.61–1.24)
Calcium: 8.4 mg/dL — ABNORMAL LOW (ref 8.9–10.3)
Chloride: 103 mmol/L (ref 101–111)
GFR, EST NON AFRICAN AMERICAN: 60 mL/min — AB (ref >60)
Glucose, Bld: 87 mg/dL (ref 65–99)
PHOSPHORUS: 3.4 mg/dL (ref 2.5–4.6)
POTASSIUM: 3.9 mmol/L (ref 3.5–5.1)
Sodium: 137 mmol/L (ref 135–145)

## 2016-09-15 LAB — CK: Total CK: 242 U/L (ref 49–397)

## 2016-09-15 MED ORDER — TRAZODONE HCL 100 MG PO TABS: 100.0000 mg | ORAL_TABLET | Freq: Every evening | ORAL | 0 refills | Status: DC | PRN

## 2016-09-15 NOTE — Progress Notes (Signed)
NURSING PROGRESS NOTE  Christian Guerra [id number] Discharge Data: 09/15/2016 11:37 AM Attending Provider: Lavina Hamman, MD [id number]   [name] to be D/C'd Home per MD order.    All IV's will be discontinued and monitored for bleeding.  All belongings will be returned to patient for patient to take home.  Last Documented Vital Signs:  Blood pressure (!) 140/52, pulse 81, temperature 97.6 F (36.4 C), temperature source Oral, resp. rate (!) 24, height 5' 4"  (1.626 m), weight (!) 174.1 kg (383 lb 14.4 oz), SpO2 100 %.  [name], MSN, RN, [employer]

## 2016-09-15 NOTE — Care Management Note (Signed)
Case Management Note  Patient Details  Name: Christian Guerra MRN: [id number] Date of Birth: November 15, 1960  Subjective/Objective:                  acute encephalopathy Action/Plan: Discharge planning Expected Discharge Date:  09/15/16               Expected Discharge Plan:  Home/Self Care  In-House Referral:     Discharge planning Services  CM Consult  Post Acute Care Choice:  NA Choice offered to:  Patient  DME Arranged:  [facility] DME Agency:     HH Arranged:  NA HH Agency:  [place]  Status of Service:  Completed, signed off  If discussed at [place] of Stay Meetings, dates discussed:    Additional Comments: CM met with pt to discuss home needs.  Pt has no insurance and wishes to have a rolling walker.  MD states no need for [facility] services.  CM notified [place] rep, Jermaine to please consider Char[employer] for bari rolling walker.  CM notified [place] D[employer] rep, reggie to please deliver the rolling walker to room is approved by [facility] for charity. No other CM needs were communicated. Dellie Catholic, RN 09/15/2016, 11:29 AM

## 2016-09-18 DIAGNOSIS — Z6841 Body Mass Index (BMI) 40.0 and over, adult: Secondary | ICD-10-CM

## 2016-09-18 NOTE — Discharge Summary (Signed)
[facility] Discharge Summary   Patient: Christian Guerra ONG:[id number]   PCP: [facility] DOB: Sep 20, 1960   Date of admission: 09/11/2016   Date of discharge: 09/15/2016    Discharge Diagnoses:  Principal Problem:   Acute encephalopathy Active Problems:   Chronic back pain   Hepatitis C   GERD (gastroesophageal reflux disease)   HTN (hypertension)   OSA (obstructive sleep apnea)   Generalized anxiety disorder   Acute kidney injury ([place])   Diastolic dysfunction with chronic heart failure ([place])   Rhabdomyolysis   Fall   Morbid obesity with BMI of 60.0-69.9, adult ([place])  Admitted From: home Disposition:  Home with family  Recommendations for Outpatient Follow-up:  1. Please follow up with PCP as well as psychiatry in 1-2 weeks   Follow-up [facility], [facility]. Schedule an appointment as soon as possible for a visit in 1 week(s).   Specialty:  Family Medicine Why:  discuss regarding resuming gabapentin and percocet at low dose. Contact information: [address] [place] [address] [phone]          Diet recommendation: cardiac diet  Activity: The patient is advised to gradually reintroduce usual activities.  Discharge Condition: good  Code Status: full code  History of present illness: As per the H and P dictated on admission, "Duell Holdren is a 56 y.o. male with medical history significant for hepatitis C, GERD, hypertension, obstructive sleep apnea, morbid obesity, diastolic dysfunction chronic pain presents to the emergency department chief complaint of fall. Initial evaluation reveals acute encephalopathy, acute renal failure, rhabdomyolysis, hyperkalemia.  Information is obtained from the patient and the chart noting that information from patient may be unreliable due to acute encephalopathy. Patient reports multiple falls this morning because "I could not stand up". He reports his legs were "too weak". He also reports some  numbness and tingling of his lower extremities. When he fell this morning he had the back of his head is unsure if he lost consciousness. Presently complains of a headache neck pain low back pain. He denies chest pain palpitation shortness of breath. He reports a intermittent productive cough and some mild increased lower extremity edema. Reportedly patient fell 3 days ago as well but did not his head or lose consciousness. Family reports bilateral lower extremity weakness as the cause of the fall. There is been no reports of any fever chills nausea vomiting diarrhea. No reports of decreased oral intake. Patient denies dysuria hematuria frequency or urgency. ED Course: In emergency department has a max temp 99.1 blood pressure slightly soft at the low end of normal is not hypoxic. Appears impaired. In the emergency department he receives IV fluids and is evaluated by neurology who recommended an MRI"  Hospital Course:  Summary of his active problems in the hospital is as following. 1. Acute encephalopathy. CT of the head without acute abnormality. No focal deficits. MRI ordered by neurology patient unable to lay still. Likely related to metabolic derangements in setting of acute renal failure and rhadomylosis - Hold nephrotoxins - unremarkable renal ultrasound - Significant improvement in mentation, PT recommends no requirement for physical therapy and discharge.  2. Acute kidney injury. Traumatic rhabdomyolysis  Home medications include lisinopril hydrochlorothiazide. - Significantly better, nephrology currently signed off. - Nephrology consult appreciated,  - We'll hold lisinopril and HCTZ on discharge.  3. Hypertension. Blood pressure fairly soft in the emergency department. Home medications include atenolol. -Hold lisinopril and hydrochlorothiazide -resume atenolol given one dose of  Lasix.  4. Chronic pain. Patient with a history of low back pain. Home medications include, Neurontin,  Flexeril, Roxicodone IR, Naprosyn, Norco -Decrease sedating medications -Physical therapy  5. Obstructive sleep apnea. Continue CPAP  6. Morbid Obesity BMI Greater 60 - recommended weigh loss  7. Chronic Diastolic dysfunction. Echo in December 2017 with an EF 55-60% mild LVH grade 2 diastolic dysfunction.  medications include hydrochlorothiazide and lisinopril as well as atenolol. -Continue beta blocker  All other chronic medical condition were stable during the hospitalization.  Patient was seen by physical therapy, who recommended no PT follow up needed. On the day of the discharge the patient's vitals were stable, and no other acute medical condition were reported by patient. the patient was felt safe to be discharge at home with home health.  Procedures and Results:  none   Consultations:  Nephrology  Neurology  DISCHARGE MEDICATION: Discharge Medication List as of 09/15/2016 10:25 AM    CONTINUE these medications which have CHANGED   Details  traZODone (DESYREL) 100 MG tablet Take 1 tablet (100 mg total) by mouth at bedtime as needed for sleep., Starting Sat 09/15/2016, Normal      CONTINUE these medications which have NOT CHANGED   Details  atenolol (TENORMIN) 50 MG tablet Take 50 mg by mouth daily., Historical Med    benztropine (COGENTIN) 0.5 MG tablet Take 0.5 mg by mouth at bedtime. , Historical Med    cyclobenzaprine (FLEXERIL) 10 MG tablet Take 2 tablets (20 mg total) by mouth 3 (three) times daily as needed for muscle spasms., Starting Thu 11/05/2013, Print    hydrOXYzine (VISTARIL) 25 MG capsule Take 25 mg by mouth 3 (three) times daily., Historical Med    Lurasidone HCl 120 MG TABS Take 120 mg by mouth daily., Historical Med    minocycline (DYNACIN) 100 MG tablet Take 100 mg by mouth 3 (three) times daily. , Historical Med    omeprazole (PRILOSEC) 20 MG capsule Take 1 capsule (20 mg total) by mouth daily., Starting Thu 11/05/2013, Print      prednisoLONE acetate (PRED FORTE) 1 % ophthalmic suspension Place 1 drop into both eyes 4 (four) times daily., Historical Med    rOPINIRole (REQUIP) 4 MG tablet Take 4 mg by mouth 3 (three) times daily., Historical Med    sertraline (ZOLOFT) 100 MG tablet Take 100 mg by mouth daily., Historical Med    triamcinolone cream (KENALOG) 0.1 % Apply 1 application topically as needed (Rash)., Historical Med      STOP taking these medications     gabapentin (NEURONTIN) 800 MG tablet      hydrochlorothiazide (HYDRODIURIL) 25 MG tablet      lisinopril (PRINIVIL,ZESTRIL) 20 MG tablet      naproxen (NAPROSYN) 500 MG tablet      oxyCODONE (ROXICODONE) 15 MG immediate release tablet        Allergies  Allergen Reactions  . Codeine Itching   Discharge Instructions    Diet - low sodium heart healthy    Complete by:  As directed    Discharge instructions    Complete by:  As directed    It is important that you read following instructions as well as go over your medication list with RN to help you understand your care after this hospitalization.  Discharge Instructions: Please follow-up with PCP in one week  Please request your primary care physician to go over all Hospital Tests and Procedure/Radiological results at the follow up,  Please get all [facility]  records sent to your PCP by signing hospital release before you go home.   Do not take more than prescribed Pain, Sleep and Anxiety Medications. You were cared for by a hospitalist during your hospital stay. If you have any questions about your discharge medications or the care you received while you were in the hospital after you are discharged, you can call the unit and ask to speak with the hospitalist on call if the hospitalist that took care of you is not available.  Once you are discharged, your primary care physician will handle any further medical issues. Please note that NO REFILLS for any discharge medications will be authorized  once you are discharged, as it is imperative that you return to your primary care physician (or establish a relationship with a primary care physician if you do not have one) for your aftercare needs so that they can reassess your need for medications and monitor your lab values. You Must read complete instructions/literature along with all the possible adverse reactions/side effects for all the Medicines you take and that have been prescribed to you. Take any new Medicines after you have completely understood and accept all the possible adverse reactions/side effects. Wear Seat belts while driving. If you have smoked or chewed Tobacco in the last 2 yrs please stop smoking and/or stop any Recreational drug use.   Increase activity slowly    Complete by:  As directed      Discharge Exam: Filed Weights   09/13/16 0506 09/14/16 0507 09/15/16 0618  Weight: (!) 174.6 kg (385 lb) (!) 173.1 kg (381 lb 9.6 oz) (!) 174.1 kg (383 lb 14.4 oz)   Vitals:   09/14/16 2106 09/15/16 0618  BP: (!) 164/76 (!) 140/52  Pulse: 76 81  Resp:  (!) 24  Temp: 99.4 F (37.4 C) 97.6 F (36.4 C)   General: Appear in no distress, no Rash; Oral Mucosa moist. Cardiovascular: S1 and S2 Present, no Murmur, no JVD Respiratory: Bilateral Air entry present and Clear to Auscultation, no Crackles, n wheezes Abdomen: Bowel Sound present, Soft and no tenderness Extremities: no Pedal edema, no calf tenderness Neurology: Grossly no focal neuro deficit.  The results of significant diagnostics from this hospitalization (including imaging, microbiology, ancillary and laboratory) are listed below for reference.    Significant Diagnostic Studies: Dg Lumbar Spine Complete  Result Date: 09/11/2016 CLINICAL DATA:  Recent falls with lower back pain EXAM: LUMBAR SPINE - COMPLETE 4+ VIEW COMPARISON:  None. FINDINGS: Five lumbar type vertebral bodies are well visualized. Vertebral body height is well maintained. No pars defects or  anterolisthesis is seen. Mild osteophytic changes are noted at thoracolumbar junction. No soft tissue changes are seen. IMPRESSION: No acute abnormality noted. Electronically Signed   By: Inez Catalina M.D.   On: 09/11/2016 13:14   Ct Head Wo Contrast  Result Date: 09/11/2016 CLINICAL DATA:  Multiple recent falls and loss of consciousness. Posterior neck pain. History of hypertension, hepatitis-C. EXAM: CT HEAD WITHOUT CONTRAST CT CERVICAL SPINE WITHOUT CONTRAST TECHNIQUE: Multidetector CT imaging of the head and cervical spine was performed following the standard protocol without intravenous contrast. Multiplanar CT image reconstructions of the cervical spine were also generated. COMPARISON:  Cervical spine radiographs Aug 24, 2013 and CT cervical spine November 15, 2011 FINDINGS: CT HEAD FINDINGS BRAIN: No intraparenchymal hemorrhage, mass effect nor midline shift. The ventricles and sulci are normal. No acute large vascular territory infarcts. No abnormal extra-axial fluid collections. Basal cisterns are patent. VASCULAR: Unremarkable. SKULL/SOFT  TISSUES: No skull fracture. No significant soft tissue swelling. ORBITS/SINUSES: The included ocular globes and orbital contents are normal.Trace paranasal sinus mucosal thickening and scattered subcentimeter mucosal retention cyst. OTHER: None. CT CERVICAL SPINE FINDINGS Large body habitus results in overall noisy image quality. ALIGNMENT: Straightened lordosis. Vertebral bodies in alignment. SKULL BASE AND VERTEBRAE: Cervical vertebral bodies and posterior elements are intact. Intervertebral disc heights preserved bridging ventral osteophytes C4 through C7. Segmental calcification the posterior longitudinal ligament. No destructive bony lesions. C1-2 articulation maintained. Longus coli insertional enthesopathy. SOFT TISSUES AND SPINAL CANAL: Nonacute, limited assessment due to body habitus. DISC LEVELS: Mild canal stenosis C2-3, C4-5, C5-6 and C6-7 due to ossified  posterior longitudinal ligament, possible mild canal stenosis at C6-7. No significant osseous neural foraminal narrowing. UPPER CHEST: Lung apices are clear. OTHER: None. IMPRESSION: CT HEAD: Negative noncontrast CT HEAD for age. CT CERVICAL SPINE: Habitus limited examination. No acute fracture or malalignment. Findings of DISH and OPLL. Multilevel mild osseous canal stenosis without significant neural foraminal narrowing. Electronically Signed   By: Elon Alas M.D.   On: 09/11/2016 14:16   Ct Cervical Spine Wo Contrast  Result Date: 09/11/2016 CLINICAL DATA:  Multiple recent falls and loss of consciousness. Posterior neck pain. History of hypertension, hepatitis-C. EXAM: CT HEAD WITHOUT CONTRAST CT CERVICAL SPINE WITHOUT CONTRAST TECHNIQUE: Multidetector CT imaging of the head and cervical spine was performed following the standard protocol without intravenous contrast. Multiplanar CT image reconstructions of the cervical spine were also generated. COMPARISON:  Cervical spine radiographs Aug 24, 2013 and CT cervical spine November 15, 2011 FINDINGS: CT HEAD FINDINGS BRAIN: No intraparenchymal hemorrhage, mass effect nor midline shift. The ventricles and sulci are normal. No acute large vascular territory infarcts. No abnormal extra-axial fluid collections. Basal cisterns are patent. VASCULAR: Unremarkable. SKULL/SOFT TISSUES: No skull fracture. No significant soft tissue swelling. ORBITS/SINUSES: The included ocular globes and orbital contents are normal.Trace paranasal sinus mucosal thickening and scattered subcentimeter mucosal retention cyst. OTHER: None. CT CERVICAL SPINE FINDINGS Large body habitus results in overall noisy image quality. ALIGNMENT: Straightened lordosis. Vertebral bodies in alignment. SKULL BASE AND VERTEBRAE: Cervical vertebral bodies and posterior elements are intact. Intervertebral disc heights preserved bridging ventral osteophytes C4 through C7. Segmental calcification the posterior  longitudinal ligament. No destructive bony lesions. C1-2 articulation maintained. Longus coli insertional enthesopathy. SOFT TISSUES AND SPINAL CANAL: Nonacute, limited assessment due to body habitus. DISC LEVELS: Mild canal stenosis C2-3, C4-5, C5-6 and C6-7 due to ossified posterior longitudinal ligament, possible mild canal stenosis at C6-7. No significant osseous neural foraminal narrowing. UPPER CHEST: Lung apices are clear. OTHER: None. IMPRESSION: CT HEAD: Negative noncontrast CT HEAD for age. CT CERVICAL SPINE: Habitus limited examination. No acute fracture or malalignment. Findings of DISH and OPLL. Multilevel mild osseous canal stenosis without significant neural foraminal narrowing. Electronically Signed   By: Elon Alas M.D.   On: 09/11/2016 14:16   US Renal  Result Date: 09/11/2016 CLINICAL DATA:  Acute kidney injury EXAM: RENAL / URINARY TRACT ULTRASOUND COMPLETE COMPARISON:  12/04/2012 FINDINGS: Right Kidney: Length: 12.1 cm. Echogenicity within normal limits. No mass or hydronephrosis visualized. Left Kidney: Length: 12.5 cm. Echogenicity within normal limits. No mass or hydronephrosis visualized. Bladder: Appears normal for degree of bladder distention. IMPRESSION: Negative renal ultrasound Electronically Signed   By: Donavan Foil M.D.   On: 09/11/2016 23:13   Dg Chest Port 1 View  Result Date: 09/14/2016 CLINICAL DATA:  Chronic heart failure.  Shortness of breath. EXAM: PORTABLE CHEST 1  VIEW COMPARISON:  09/11/2016 FINDINGS: Cardiomegaly. Lungs clear. No effusions. No acute bony abnormality. IMPRESSION: Cardiomegaly.  No active disease. Electronically Signed   By: Rolm Baptise M.D.   On: 09/14/2016 09:48   Dg Chest Port 1 View  Result Date: 09/11/2016 CLINICAL DATA:  Wheezing EXAM: PORTABLE CHEST 1 VIEW COMPARISON:  06/06/2016 FINDINGS: Hypoventilation with bibasilar atelectasis. Negative for heart failure or effusion IMPRESSION: Hypoventilation with bibasilar atelectasis.  Electronically Signed   By: Franchot Gallo M.D.   On: 09/11/2016 20:51    Microbiology: No results found for this or any previous visit (from the past 240 hour(s)).   Labs: CBC:  Recent Labs Lab 09/11/16 1435 09/11/16 1803 09/12/16 0359 09/13/16 0454  WBC 11.9*  --  8.3 7.8  NEUTROABS 9.7*  --   --  5.7  HGB 10.0*  --  9.6* 9.6*  HCT 31.6* 29.9* 30.4* 30.1*  MCV 81.7  --  81.7 82.2  PLT 244  --  243 168   Basic Metabolic Panel:  Recent Labs Lab 09/11/16 1435 09/11/16 1803 09/12/16 0359 09/13/16 0454 09/14/16 0515 09/15/16 0341  NA 132*  --  135 139 139 137  K 5.4*  --  4.5 4.6 4.8 3.9  CL 100*  --  103 106 104 103  CO2 22  --  23 25 27 26   GLUCOSE 96  --  98 92 87 87  BUN 82*  --  57* 28* 19 17  CREATININE 4.30* 3.53* 2.20* 1.22 1.14 1.30*  CALCIUM 8.2*  --  8.3* 8.3* 8.7* 8.4*  MG  --   --   --  2.1  --   --   PHOS  --   --   --   --  2.5 3.4   Liver Function Tests:  Recent Labs Lab 09/11/16 1435 09/12/16 0359 09/13/16 0454 09/14/16 0515 09/15/16 0341  AST 63* 51* 33  --   --   ALT 28 27 27   --   --   ALKPHOS 111 110 90  --   --   BILITOT 0.7 0.6 0.7  --   --   PROT 6.7 6.7 6.5  --   --   ALBUMIN 3.1* 2.8* 2.8* 2.8* 2.8*   No results for input(s): LIPASE, AMYLASE in the last 168 hours.  Recent Labs Lab 09/11/16 1901  AMMONIA 16   Cardiac Enzymes:  Recent Labs Lab 09/11/16 2147 09/12/16 0359 09/13/16 0454 09/14/16 0514 09/15/16 0341  CKTOTAL 2,402* 2,000* 827* 367 242   BNP (last 3 results)  Recent Labs  06/06/16 0918 09/11/16 1435  BNP 9.1 96.9   CBG: No results for input(s): GLUCAP in the last 168 hours. Time spent: 35 minutes  Signed:  Dare Sanger  [facility] 09/15/2016 , 7:30 AM

## 2016-10-04 ENCOUNTER — Inpatient Hospital Stay: Payer: Self-pay | Admitting: Family Medicine

## 2016-10-04 NOTE — Progress Notes (Deleted)
   Subjective:  Patient ID: Christian Guerra, male    DOB: 02-Dec-1960  Age: 56 y.o. MRN: [id number]  CC: No chief complaint on file.   HPI Christian Guerra presents for  PMH of Hep C, GERD, HTN, OSA, morbid obesity, diastolic dysfunction. History of hospital admission on 09/11/2016 due to BLE weakness, multiple falls. He also reported hitting his head with fall but uncertain if had LOC.Hospital evaluation found acute encephalopathy, acute kidney failure, rhabdomyolysis, and hyperkalemia.    Psych referral   No HTCZ, ACEI/ARB,  PT for  back Gabapentin ? Tylenol 3 ?   Outpatient Medications Prior to Visit  Medication Sig Dispense Refill  . atenolol (TENORMIN) 50 MG tablet Take 50 mg by mouth daily.    . benztropine (COGENTIN) 0.5 MG tablet Take 0.5 mg by mouth at bedtime.     . cyclobenzaprine (FLEXERIL) 10 MG tablet Take 2 tablets (20 mg total) by mouth 3 (three) times daily as needed for muscle spasms. (Patient taking differently: Take 30 mg by mouth 3 (three) times daily. ) 90 tablet 3  . hydrOXYzine (VISTARIL) 25 MG capsule Take 25 mg by mouth 3 (three) times daily.    . Lurasidone HCl 120 MG TABS Take 120 mg by mouth daily.    . minocycline (DYNACIN) 100 MG tablet Take 100 mg by mouth 3 (three) times daily.     Marland Kitchen omeprazole (PRILOSEC) 20 MG capsule Take 1 capsule (20 mg total) by mouth daily. 90 capsule 3  . prednisoLONE acetate (PRED FORTE) 1 % ophthalmic suspension Place 1 drop into both eyes 4 (four) times daily.    Marland Kitchen rOPINIRole (REQUIP) 4 MG tablet Take 4 mg by mouth 3 (three) times daily.    . sertraline (ZOLOFT) 100 MG tablet Take 100 mg by mouth daily.    . traZODone (DESYREL) 100 MG tablet Take 1 tablet (100 mg total) by mouth at bedtime as needed for sleep. 10 tablet 0  . triamcinolone cream (KENALOG) 0.1 % Apply 1 application topically as needed (Rash).     No facility-administered medications prior to visit.     ROS Review of Systems     Objective:  There were no vitals  taken for this visit.  BP/Weight 09/15/2016 07/13/2016 8/93/8101  Systolic BP 751 025 852  Diastolic BP 52 67 72  Wt. (Lbs) 383.9 404.8 387.2  BMI 65.9 65.34 62.5     Physical Exam   Assessment & Plan:   Problem List Items Addressed This Visit    None      No orders of the defined types were placed in this encounter.   Follow-up: No Follow-up on file.   Alfonse Spruce FNP

## 2016-10-12 ENCOUNTER — Encounter: Payer: Self-pay | Admitting: Family Medicine

## 2016-10-12 ENCOUNTER — Ambulatory Visit: Payer: Self-pay | Attending: Family Medicine | Admitting: Family Medicine

## 2016-10-12 VITALS — BP 107/67 | HR 91 | Temp 99.2°F | Resp 24 | Wt 388.0 lb

## 2016-10-12 DIAGNOSIS — Z9114 Patient's other noncompliance with medication regimen: Secondary | ICD-10-CM

## 2016-10-12 DIAGNOSIS — R4182 Altered mental status, unspecified: Secondary | ICD-10-CM

## 2016-10-12 DIAGNOSIS — R531 Weakness: Secondary | ICD-10-CM

## 2016-10-12 DIAGNOSIS — R062 Wheezing: Secondary | ICD-10-CM

## 2016-10-12 LAB — GLUCOSE, POCT (MANUAL RESULT ENTRY): POC Glucose: 83 mg/dl (ref 70–99)

## 2016-10-12 MED ORDER — ALBUTEROL SULFATE HFA 108 (90 BASE) MCG/ACT IN AERS: 1.0000 | INHALATION_SPRAY | Freq: Four times a day (QID) | RESPIRATORY_TRACT | 0 refills | Status: DC | PRN

## 2016-10-12 MED ORDER — IPRATROPIUM-ALBUTEROL 0.5-2.5 (3) MG/3ML IN SOLN
3.0000 mL | RESPIRATORY_TRACT | Status: DC | PRN
  Administered 2016-10-12 – 2016-11-16 (×2): 3 mL via RESPIRATORY_TRACT

## 2016-10-12 NOTE — Patient Instructions (Addendum)
Go to the emergency department as soon as possible for further evaluation.

## 2016-10-12 NOTE — Progress Notes (Signed)
Subjective:  Patient ID: Christian Guerra, male    DOB: Apr 26, 1960  Age: 56 y.o. MRN: [id number]  CC: Hospitalization Follow-up (SOB, FALL)   HPI Christian Guerra presents for hospital follow up. PHM of HTN, bipolar, morbid obesity, GERD, chronic low back pain, OSA, diastolic dysfunction, and hepatitis C. Recent history of hospitalization last month from 09/11/16- 09/15/2016. For complaints of weakness, multiple falls, intermittent productive cough and BLE. Workup reveled acute encephalopathy, acute renal failure, rhabdomyolysis, hyperkalemia, and soft BP's. Today he states he has " difficulty processing things", weakness, and productive cough. He also reports that he may have taken more of his medication than prescribed today, but is unable to recall which one. He denies any CP, SOB, or dizziness.   Outpatient Medications Prior to Visit  Medication Sig Dispense Refill  . atenolol (TENORMIN) 50 MG tablet Take 50 mg by mouth daily.    . benztropine (COGENTIN) 0.5 MG tablet Take 0.5 mg by mouth at bedtime.     . cyclobenzaprine (FLEXERIL) 10 MG tablet Take 2 tablets (20 mg total) by mouth 3 (three) times daily as needed for muscle spasms. (Patient taking differently: Take 30 mg by mouth 3 (three) times daily. ) 90 tablet 3  . hydrOXYzine (VISTARIL) 25 MG capsule Take 25 mg by mouth 3 (three) times daily.    . Lurasidone HCl 120 MG TABS Take 120 mg by mouth daily.    . minocycline (DYNACIN) 100 MG tablet Take 100 mg by mouth 3 (three) times daily.     Marland Kitchen omeprazole (PRILOSEC) 20 MG capsule Take 1 capsule (20 mg total) by mouth daily. 90 capsule 3  . sertraline (ZOLOFT) 100 MG tablet Take 100 mg by mouth daily.    . prednisoLONE acetate (PRED FORTE) 1 % ophthalmic suspension Place 1 drop into both eyes 4 (four) times daily.    Marland Kitchen rOPINIRole (REQUIP) 4 MG tablet Take 4 mg by mouth 3 (three) times daily.    . traZODone (DESYREL) 100 MG tablet Take 1 tablet (100 mg total) by mouth at bedtime as needed for sleep.  (Patient not taking: Reported on 10/12/2016) 10 tablet 0  . triamcinolone cream (KENALOG) 0.1 % Apply 1 application topically as needed (Rash).     No facility-administered medications prior to visit.     ROS Review of Systems  HENT: Negative.   Eyes: Negative.   Respiratory: Positive for cough and wheezing.   Cardiovascular: Positive for leg swelling.  Gastrointestinal: Negative.   Genitourinary: Negative.   Musculoskeletal: Negative.   Skin: Negative.   Neurological: Positive for weakness.  Psychiatric/Behavioral: Positive for decreased concentration. Negative for suicidal ideas.    Objective:  BP 107/67 (BP Location: Right Arm, Patient Position: Sitting, Cuff Size: Large)   Pulse 91   Temp 99.2 F (37.3 C) (Axillary)   Resp (!) 24   Wt (!) 388 lb (176 kg)   SpO2 98%   BMI 66.60 kg/m   BP/Weight 10/12/2016 09/15/2016 5/57/3220  Systolic BP 254 270 623  Diastolic BP 67 52 67  Wt. (Lbs) 388 383.9 404.8  BMI 66.6 65.9 65.34     Physical Exam  Constitutional: He is oriented to person, place, and time. He appears well-developed and well-nourished.  obese  HENT:  Head: Normocephalic and atraumatic.  Right Ear: External ear normal.  Left Ear: External ear normal.  Nose: Nose normal.  Mouth/Throat: Oropharynx is clear and moist.  Eyes: Conjunctivae are normal. Pupils are equal, round, and reactive to light.  Neck:  Normal range of motion. Neck supple.  Cardiovascular: Normal rate, regular rhythm, normal heart sounds and intact distal pulses.   Pulmonary/Chest: He has wheezes.  No crackles present  Abdominal: Soft. Bowel sounds are normal. There is no tenderness.  Musculoskeletal: Normal range of motion.  Lymphadenopathy:    He has no cervical adenopathy.  Neurological: He is alert and oriented to person, place, and time.  Appears listless.  Speech slurred.   Skin:  BLE to ankles 2+ pitting.   Psychiatric: He is slowed and withdrawn.   Assessment & Plan:    Problem List Items Addressed This Visit    None    Visit Diagnoses    Altered mental status, unspecified altered mental status type    -  Primary   Refer to Emergency Department based on patient's previous history and present presenting symptoms.   Called and gave report to [place], RN.   Patient reports being brought to clinic by brother in law and requests to be transported by private vehicle.   Relevant Orders   Glucose (CBG) (Completed)   General weakness       Wheezing       Breathing treatment given in office.   Relevant Medications   ipratropium-albuterol (DUONEB) 0.5-2.5 (3) MG/3ML nebulizer solution 3 mL   albuterol (PROVENTIL HFA;VENTOLIN HFA) 108 (90 Base) MCG/ACT inhaler   Patient has difficulty preparing medication       Pt.reports difficulty taking prescribed dosages of medication. Recommend CM referral to provide     resources for in home medication assistance.   Relevant Orders   Consult to care management      Meds ordered this encounter  Medications  . ipratropium-albuterol (DUONEB) 0.5-2.5 (3) MG/3ML nebulizer solution 3 mL  . albuterol (PROVENTIL HFA;VENTOLIN HFA) 108 (90 Base) MCG/ACT inhaler    Sig: Inhale 1 puff into the lungs every 6 (six) hours as needed for wheezing or shortness of breath.    Dispense:  1 Inhaler    Refill:  0    Order Specific Question:   Supervising Provider    Answer:   Tresa Garter [[id number]]    Follow-up: Go to the Emergency Department .   Alfonse Spruce FNP

## 2016-12-06 ENCOUNTER — Ambulatory Visit: Payer: Self-pay | Admitting: Family Medicine

## 2016-12-06 ENCOUNTER — Telehealth: Payer: Self-pay

## 2016-12-06 NOTE — Telephone Encounter (Signed)
Contacted pt to reschedule appointment for 12-06-16 pt didn't answer spoke with pt father and reschedule appointment

## 2016-12-07 ENCOUNTER — Ambulatory Visit: Payer: Self-pay | Attending: Family Medicine | Admitting: Family Medicine

## 2016-12-07 ENCOUNTER — Encounter: Payer: Self-pay | Admitting: Family Medicine

## 2016-12-07 VITALS — BP 140/79 | HR 116 | Temp 98.1°F | Resp 18 | Ht 66.0 in | Wt 380.0 lb

## 2016-12-07 DIAGNOSIS — Z91199 Patient's noncompliance with other medical treatment and regimen due to unspecified reason: Secondary | ICD-10-CM

## 2016-12-07 DIAGNOSIS — R05 Cough: Secondary | ICD-10-CM | POA: Insufficient documentation

## 2016-12-07 DIAGNOSIS — I110 Hypertensive heart disease with heart failure: Secondary | ICD-10-CM | POA: Insufficient documentation

## 2016-12-07 DIAGNOSIS — I10 Essential (primary) hypertension: Secondary | ICD-10-CM

## 2016-12-07 DIAGNOSIS — I509 Heart failure, unspecified: Secondary | ICD-10-CM | POA: Insufficient documentation

## 2016-12-07 DIAGNOSIS — Z87448 Personal history of other diseases of urinary system: Secondary | ICD-10-CM

## 2016-12-07 DIAGNOSIS — M6282 Rhabdomyolysis: Secondary | ICD-10-CM | POA: Insufficient documentation

## 2016-12-07 DIAGNOSIS — M5412 Radiculopathy, cervical region: Secondary | ICD-10-CM | POA: Insufficient documentation

## 2016-12-07 DIAGNOSIS — R296 Repeated falls: Secondary | ICD-10-CM | POA: Insufficient documentation

## 2016-12-07 DIAGNOSIS — Z9119 Patient's noncompliance with other medical treatment and regimen: Secondary | ICD-10-CM

## 2016-12-07 DIAGNOSIS — F319 Bipolar disorder, unspecified: Secondary | ICD-10-CM | POA: Insufficient documentation

## 2016-12-07 DIAGNOSIS — I5032 Chronic diastolic (congestive) heart failure: Secondary | ICD-10-CM

## 2016-12-07 DIAGNOSIS — N179 Acute kidney failure, unspecified: Secondary | ICD-10-CM | POA: Insufficient documentation

## 2016-12-07 DIAGNOSIS — E875 Hyperkalemia: Secondary | ICD-10-CM | POA: Insufficient documentation

## 2016-12-07 DIAGNOSIS — Z79899 Other long term (current) drug therapy: Secondary | ICD-10-CM | POA: Insufficient documentation

## 2016-12-07 MED ORDER — ATENOLOL 50 MG PO TABS: 50.0000 mg | ORAL_TABLET | Freq: Every day | ORAL | 2 refills | Status: DC

## 2016-12-07 NOTE — Progress Notes (Signed)
Patient is here for HTN   Been without BP med for 2 weeks

## 2016-12-07 NOTE — Patient Instructions (Signed)

## 2016-12-07 NOTE — Progress Notes (Signed)
Subjective:  Patient ID: Christian Guerra, male    DOB: [birth year]  Age: 56 y.o. MRN: [id number]  CC: Hypertension   HPI Kenshin Splawn presents for HTN follow up. PHM of HTN, bipolar, morbid obesity, GERD, chronic low back pain, OSA, diastolic dysfunction, and hepatitis C. Recent history of hospitalization from 09/11/16- 09/15/2016. For complaints of weakness, multiple falls, intermittent productive cough and BLE. Workup reveled acute encephalopathy, acute renal failure, rhabdomyolysis, hyperkalemia, and soft BP's. Per last office visit it was recommend that he be evaluated in the ED. He reports not following up with ED referral. He reports he stopped taking his BP medications as prescribed 2 weeks ago. He reports he took his lisinopril 4 days ago and BP was 92/57.Patient denies chest pain, chest pressure/discomfort, claudication, dyspnea, lower extremity edema, near-syncope, orthopnea, palpitations and syncope.  Cardiovascular risk factors: advanced age (older than 18 for men, 60 for women), dyslipidemia, hypertension, male gender, obesity (BMI >= 30 kg/m2) and sedentary lifestyle. Use of agents associated with hypertension: none. History of target organ damage: heart failure.History of chronic neck and back pain. He c/o numbness to bilateral feet for 2 months. Currently taking gabapentin for symptoms. History of bipolar disorder. He reports seeing a mental health provider every 3 months. He denies any SI/HI.   Outpatient Medications Prior to Visit  Medication Sig Dispense Refill  . albuterol (PROVENTIL HFA;VENTOLIN HFA) 108 (90 Base) MCG/ACT inhaler Inhale 1 puff into the lungs every 6 (six) hours as needed for wheezing or shortness of breath. 1 Inhaler 0  . benztropine (COGENTIN) 0.5 MG tablet Take 0.5 mg by mouth at bedtime.     . cyclobenzaprine (FLEXERIL) 10 MG tablet Take 2 tablets (20 mg total) by mouth 3 (three) times daily as needed for muscle spasms. (Patient taking differently: Take 30 mg by mouth  3 (three) times daily. ) 90 tablet 3  . hydrOXYzine (VISTARIL) 25 MG capsule Take 25 mg by mouth 3 (three) times daily.    . Lurasidone HCl 120 MG TABS Take 120 mg by mouth daily.    . minocycline (DYNACIN) 100 MG tablet Take 100 mg by mouth 3 (three) times daily.     Marland Kitchen omeprazole (PRILOSEC) 20 MG capsule Take 1 capsule (20 mg total) by mouth daily. 90 capsule 3  . rOPINIRole (REQUIP) 4 MG tablet Take 4 mg by mouth 3 (three) times daily.    . sertraline (ZOLOFT) 100 MG tablet Take 100 mg by mouth daily.    . traZODone (DESYREL) 100 MG tablet Take 1 tablet (100 mg total) by mouth at bedtime as needed for sleep. (Patient not taking: Reported on 10/12/2016) 10 tablet 0  . triamcinolone cream (KENALOG) 0.1 % Apply 1 application topically as needed (Rash).    Marland Kitchen atenolol (TENORMIN) 50 MG tablet Take 50 mg by mouth daily.    Marland Kitchen lisinopril (PRINIVIL,ZESTRIL) 20 MG tablet Take 20 mg by mouth daily.    . prednisoLONE acetate (PRED FORTE) 1 % ophthalmic suspension Place 1 drop into both eyes 4 (four) times daily.     Facility-Administered Medications Prior to Visit  Medication Dose Route Frequency Provider Last Rate Last Dose  . ipratropium-albuterol (DUONEB) 0.5-2.5 (3) MG/3ML nebulizer solution 3 mL  3 mL Nebulization Q20 Min PRN Marcena Dias R, FNP   3 mL at 11/16/16 1233    ROS Review of Systems  Constitutional: Negative.   Eyes: Negative.   Respiratory: Negative.   Cardiovascular: Negative.   Gastrointestinal: Negative.   Musculoskeletal: Positive  for neck pain.  Skin: Negative.   Neurological:       Paresthesias  Psychiatric/Behavioral: Negative for suicidal ideas.       History of bipolar    Objective:  BP 140/79 (BP Location: Left Arm, Patient Position: Sitting, Cuff Size: Normal)   Pulse (!) 116   Temp 98.1 F (36.7 C) (Oral)   Resp 18   Ht 5' 6"  (1.676 m)   Wt (!) 380 lb (172.4 kg)   SpO2 98%   BMI 61.33 kg/m   BP/Weight 12/07/2016 10/12/2016 8/59/2924  Systolic BP 462  863 817  Diastolic BP 79 67 52  Wt. (Lbs) 380 388 383.9  BMI 61.33 66.6 65.9     Physical Exam  Constitutional: He is oriented to person, place, and time. He appears well-developed and well-nourished.  obese  Eyes: Pupils are equal, round, and reactive to light. Conjunctivae are normal.  Neck: No JVD present.  Cardiovascular: Normal rate, regular rhythm, normal heart sounds and intact distal pulses.   Pulmonary/Chest: Effort normal and breath sounds normal. He has no wheezes.  Abdominal: Soft. Bowel sounds are normal. There is no tenderness.  Musculoskeletal: Normal range of motion.       Cervical back: He exhibits pain.  Neurological: He is alert and oriented to person, place, and time.  Reflex Scores:      Tricep reflexes are 2+ on the right side and 2+ on the left side.      Bicep reflexes are 2+ on the right side and 2+ on the left side.      Brachioradialis reflexes are 2+ on the right side and 2+ on the left side. Skin: Skin is warm and dry.  Psychiatric: He expresses no homicidal and no suicidal ideation. He expresses no suicidal plans and no homicidal plans.  Nursing note and vitals reviewed.  Assessment & Plan:   Problem List Items Addressed This Visit      Cardiovascular and Mediastinum   HTN (hypertension) - Primary (Chronic)   Patient told to d/c lisinopril use. Lisinopril was d/c 3 months ago since hospital d/c.   Atenolol added.    Schedule BP in 1 week with clinic RN.   Relevant Medications   atenolol (TENORMIN) 50 MG tablet   Diastolic dysfunction with chronic heart failure (HCC)   Relevant Medications   atenolol (TENORMIN) 50 MG tablet     Other   Current non-adherence to medical treatment    Other Visit Diagnoses    Cervical radicular pain       Relevant Orders   Ambulatory referral to Orthopedics   History of renal insufficiency       Relevant Orders   CMP14+EGFR (Completed)      Meds ordered this encounter  Medications  . atenolol (TENORMIN)  50 MG tablet    Sig: Take 1 tablet (50 mg total) by mouth daily.    Dispense:  30 tablet    Refill:  2    Order Specific Question:   Supervising Provider    Answer:   Tresa Garter [id number]    Follow-up: Return in about 1 week (around 12/14/2016) for BP check with [place].   Alfonse Spruce FNP

## 2016-12-08 LAB — CMP14+EGFR
ALT: 25 IU/L (ref 0–44)
AST: 19 IU/L (ref 0–40)
Albumin/Globulin Ratio: 1.2 (ref 1.2–2.2)
Albumin: 3.6 g/dL (ref 3.5–5.5)
Alkaline Phosphatase: 152 IU/L — ABNORMAL HIGH (ref 39–117)
BUN / CREAT RATIO: 13 (ref 9–20)
BUN: 14 mg/dL (ref 6–24)
CALCIUM: 8.9 mg/dL (ref 8.7–10.2)
CHLORIDE: 96 mmol/L (ref 96–106)
CO2: 25 mmol/L (ref 20–29)
Creatinine, Ser: 1.11 mg/dL (ref 0.76–1.27)
GFR, EST AFRICAN AMERICAN: 85 mL/min/{1.73_m2} (ref >59)
GFR, EST NON AFRICAN AMERICAN: 74 mL/min/{1.73_m2} (ref >59)
Globulin, Total: 3.1 g/dL (ref 1.5–4.5)
Glucose: 130 mg/dL — ABNORMAL HIGH (ref 65–99)
Potassium: 4.1 mmol/L (ref 3.5–5.2)
Sodium: 136 mmol/L (ref 134–144)
Total Protein: 6.7 g/dL (ref 6.0–8.5)

## 2016-12-17 ENCOUNTER — Ambulatory Visit: Payer: Self-pay | Attending: Family Medicine | Admitting: *Deleted

## 2016-12-17 VITALS — BP 124/65 | HR 92 | Resp 20 | Wt 376.0 lb

## 2016-12-17 DIAGNOSIS — Z79899 Other long term (current) drug therapy: Secondary | ICD-10-CM | POA: Insufficient documentation

## 2016-12-17 DIAGNOSIS — Z0130 Encounter for examination of blood pressure without abnormal findings: Secondary | ICD-10-CM

## 2016-12-17 DIAGNOSIS — I10 Essential (primary) hypertension: Secondary | ICD-10-CM | POA: Insufficient documentation

## 2016-12-17 NOTE — Progress Notes (Signed)
Pt arrived to [facility]. Pt alert and oriented and arrives in good spirits. Last OV 12/07/2016 with PCP.  Pt denies chest pain, SOB, HA, dizziness, or blurred vision.  Verified medication.   Blood pressure reading: 124/65

## 2016-12-18 ENCOUNTER — Telehealth: Payer: Self-pay

## 2016-12-18 ENCOUNTER — Other Ambulatory Visit: Payer: Self-pay | Admitting: Family Medicine

## 2016-12-18 DIAGNOSIS — M542 Cervicalgia: Secondary | ICD-10-CM

## 2016-12-18 DIAGNOSIS — G8929 Other chronic pain: Principal | ICD-10-CM

## 2016-12-18 DIAGNOSIS — M549 Dorsalgia, unspecified: Principal | ICD-10-CM

## 2016-12-18 DIAGNOSIS — M5410 Radiculopathy, site unspecified: Secondary | ICD-10-CM

## 2016-12-18 MED ORDER — GABAPENTIN 800 MG PO TABS: ORAL_TABLET | ORAL | 3 refills | Status: DC

## 2016-12-18 NOTE — Telephone Encounter (Signed)
-----   Message from Alfonse Spruce, [place] sent at 12/18/2016 11:59 AM EDT ----- Kidney function has improved since last labs and is normal. Liver function normal Your dosage of gabapentin will be increased.

## 2016-12-18 NOTE — Telephone Encounter (Signed)
CMA call regarding lab results   Patient did not answer & left a VM stating the reason of the call & to call back

## 2017-03-12 NOTE — Telephone Encounter (Signed)
This encounter was created in error - please disregard.

## 2017-03-25 ENCOUNTER — Ambulatory Visit: Payer: Self-pay | Attending: Family Medicine | Admitting: Family Medicine

## 2017-03-25 ENCOUNTER — Encounter: Payer: Self-pay | Admitting: Family Medicine

## 2017-03-25 VITALS — BP 106/69 | HR 103 | Temp 98.5°F | Resp 18 | Ht 66.0 in | Wt 370.2 lb

## 2017-03-25 DIAGNOSIS — Z79899 Other long term (current) drug therapy: Secondary | ICD-10-CM | POA: Insufficient documentation

## 2017-03-25 DIAGNOSIS — Q159 Congenital malformation of eye, unspecified: Secondary | ICD-10-CM

## 2017-03-25 DIAGNOSIS — I10 Essential (primary) hypertension: Secondary | ICD-10-CM

## 2017-03-25 DIAGNOSIS — I509 Heart failure, unspecified: Secondary | ICD-10-CM | POA: Insufficient documentation

## 2017-03-25 DIAGNOSIS — H547 Unspecified visual loss: Secondary | ICD-10-CM | POA: Insufficient documentation

## 2017-03-25 DIAGNOSIS — I110 Hypertensive heart disease with heart failure: Secondary | ICD-10-CM | POA: Insufficient documentation

## 2017-03-25 DIAGNOSIS — H5462 Unqualified visual loss, left eye, normal vision right eye: Secondary | ICD-10-CM | POA: Insufficient documentation

## 2017-03-25 MED ORDER — ATENOLOL 50 MG PO TABS: 50.0000 mg | ORAL_TABLET | Freq: Every day | ORAL | 2 refills | Status: AC

## 2017-03-25 NOTE — Progress Notes (Signed)
Patient is here for eye problem   Patient complains film covering his left eye

## 2017-03-25 NOTE — Patient Instructions (Signed)
After leaving office immediately go to [facility].  Address: [address], [place], [place] [address] Phone: [phone]    Managing Your Hypertension Hypertension is commonly called high blood pressure. This is when the force of your blood pressing against the walls of your arteries is too strong. Arteries are blood vessels that carry blood from your heart throughout your body. Hypertension forces the heart to work harder to pump blood, and may cause the arteries to become narrow or stiff. Having untreated or uncontrolled hypertension can cause heart attack, stroke, kidney disease, and other problems. What are blood pressure readings? A blood pressure reading consists of a higher number over a lower number. Ideally, your blood pressure should be below 120/80. The first ("top") number is called the systolic pressure. It is a measure of the pressure in your arteries as your heart beats. The second ("bottom") number is called the diastolic pressure. It is a measure of the pressure in your arteries as the heart relaxes. What does my blood pressure reading mean? Blood pressure is classified into four stages. Based on your blood pressure reading, your health care provider may use the following stages to determine what type of treatment you need, if any. Systolic pressure and diastolic pressure are measured in a unit called mm Hg. Normal  Systolic pressure: below 585.  Diastolic pressure: below 80. Elevated  Systolic pressure: 277-824.  Diastolic pressure: below 80. Hypertension stage 1  Systolic pressure: 235-361.  Diastolic pressure: 44-31. Hypertension stage 2  Systolic pressure: 540 or above.  Diastolic pressure: 90 or above. What health risks are associated with hypertension? Managing your hypertension is an important responsibility. Uncontrolled hypertension can lead to:  A heart attack.  A stroke.  A weakened blood vessel (aneurysm).  Heart failure.  Kidney  damage.  Eye damage.  Metabolic syndrome.  Memory and concentration problems.  What changes can I make to manage my hypertension? Hypertension can be managed by making lifestyle changes and possibly by taking medicines. Your health care provider will help you make a plan to bring your blood pressure within a normal range. Eating and drinking  Eat a diet that is high in fiber and potassium, and low in salt (sodium), added sugar, and fat. An example eating plan is called the DASH (Dietary Approaches to Stop Hypertension) diet. To eat this way: ? Eat plenty of fresh fruits and vegetables. Try to fill half of your plate at each meal with fruits and vegetables. ? Eat whole grains, such as whole wheat pasta, brown rice, or whole grain bread. Fill about one quarter of your plate with whole grains. ? Eat low-fat diary products. ? Avoid fatty cuts of meat, processed or cured meats, and poultry with skin. Fill about one quarter of your plate with lean proteins such as fish, chicken without skin, beans, eggs, and tofu. ? Avoid premade and processed foods. These tend to be higher in sodium, added sugar, and fat.  Reduce your daily sodium intake. Most people with hypertension should eat less than 1,500 mg of sodium a day.  Limit alcohol intake to no more than 1 drink a day for nonpregnant women and 2 drinks a day for men. One drink equals 12 oz of beer, 5 oz of wine, or 1 oz of hard liquor. Lifestyle  Work with your health care provider to maintain a healthy body weight, or to lose weight. Ask what an ideal weight is for you.  Get at least 30 minutes of exercise  that causes your heart to beat faster (aerobic exercise) most days of the week. Activities may include walking, swimming, or biking.  Include exercise to strengthen your muscles (resistance exercise), such as weight lifting, as part of your weekly exercise routine. Try to do these types of exercises for 30 minutes at least 3 days a  week.  Do not use any products that contain nicotine or tobacco, such as cigarettes and e-cigarettes. If you need help quitting, ask your health care provider.  Control any long-term (chronic) conditions you have, such as high cholesterol or diabetes. Monitoring  Monitor your blood pressure at home as told by your health care provider. Your personal target blood pressure may vary depending on your medical conditions, your age, and other factors.  Have your blood pressure checked regularly, as often as told by your health care provider. Working with your health care provider  Review all the medicines you take with your health care provider because there may be side effects or interactions.  Talk with your health care provider about your diet, exercise habits, and other lifestyle factors that may be contributing to hypertension.  Visit your health care provider regularly. Your health care provider can help you create and adjust your plan for managing hypertension. Will I need medicine to control my blood pressure? Your health care provider may prescribe medicine if lifestyle changes are not enough to get your blood pressure under control, and if:  Your systolic blood pressure is 130 or higher.  Your diastolic blood pressure is 80 or higher.  Take medicines only as told by your health care provider. Follow the directions carefully. Blood pressure medicines must be taken as prescribed. The medicine does not work as well when you skip doses. Skipping doses also puts you at risk for problems. Contact a health care provider if:  You think you are having a reaction to medicines you have taken.  You have repeated (recurrent) headaches.  You feel dizzy.  You have swelling in your ankles.  You have trouble with your vision. Get help right away if:  You develop a severe headache or confusion.  You have unusual weakness or numbness, or you feel faint.  You have severe pain in your chest  or abdomen.  You vomit repeatedly.  You have trouble breathing. Summary  Hypertension is when the force of blood pumping through your arteries is too strong. If this condition is not controlled, it may put you at risk for serious complications.  Your personal target blood pressure may vary depending on your medical conditions, your age, and other factors. For most people, a normal blood pressure is less than 120/80.  Hypertension is managed by lifestyle changes, medicines, or both. Lifestyle changes include weight loss, eating a healthy, low-sodium diet, exercising more, and limiting alcohol. This information is not intended to replace advice given to you by your health care provider. Make sure you discuss any questions you have with your health care provider. Document Released: 01/02/2012 Document Revised: 03/07/2016 Document Reviewed: 03/07/2016 Elsevier Interactive Patient Education  [employer].

## 2017-03-26 NOTE — Progress Notes (Signed)
Subjective:  Patient ID: Christian Guerra, male    DOB: 1960/10/08  Age: 56 y.o. MRN: [id number]  CC: Eye Problem (left )   HPI Christian Guerra presents for eye complaint. Eye complaint: Onset 1 month ago to  left eye.  Associated symptoms include foreign sensation in eye, watery eye, and filmlike covering to eye.  He reports gradual vision loss.  He reports using prednisone ophthalmologic drops he received 5 years prior.  He reports seeing eye specialist in the past but is unsure of diagnosis. Hypertension follow up: He is not exercising and is not adherent to low salt diet.  He does not check BP at home.Patient denies chest pain, chest pressure/discomfort, claudication, dyspnea, lower extremity edema, near-syncope, orthopnea, palpitations and syncope.  Cardiovascular risk factors: advanced age (older than 38 for men, 53 for women), dyslipidemia, hypertension, male gender, obesity (BMI >= 30 kg/m2), tobacco use,  and sedentary lifestyle.  He reports dipping 1 can of snuff per day.  Use of agents associated with hypertension: none. History of target organ damage: heart failure.   Outpatient Medications Prior to Visit  Medication Sig Dispense Refill  . albuterol (PROVENTIL HFA;VENTOLIN HFA) 108 (90 Base) MCG/ACT inhaler Inhale 1 puff into the lungs every 6 (six) hours as needed for wheezing or shortness of breath. (Patient not taking: Reported on 12/17/2016) 1 Inhaler 0  . benztropine (COGENTIN) 0.5 MG tablet Take 0.5 mg by mouth at bedtime.     . cyclobenzaprine (FLEXERIL) 10 MG tablet Take 2 tablets (20 mg total) by mouth 3 (three) times daily as needed for muscle spasms. (Patient taking differently: Take 30 mg by mouth 3 (three) times daily. ) 90 tablet 3  . gabapentin (NEURONTIN) 800 MG tablet TAKE 2 TABLETS BY MOUTH IN THE MORNING, 1 TABLET BY MOUTH IN THE AFTERNOON, AND 1 TABLET BY MOUTH AT NIGHT. 120 tablet 3  . hydrOXYzine (VISTARIL) 25 MG capsule Take 25 mg by mouth 3 (three) times daily.    .  Lurasidone HCl 120 MG TABS Take 120 mg by mouth daily.    . minocycline (DYNACIN) 100 MG tablet Take 100 mg by mouth 3 (three) times daily.     Marland Kitchen omeprazole (PRILOSEC) 20 MG capsule Take 1 capsule (20 mg total) by mouth daily. 90 capsule 3  . rOPINIRole (REQUIP) 4 MG tablet Take 4 mg by mouth 3 (three) times daily.    . sertraline (ZOLOFT) 100 MG tablet Take 100 mg by mouth daily.    . traZODone (DESYREL) 100 MG tablet Take 1 tablet (100 mg total) by mouth at bedtime as needed for sleep. 10 tablet 0  . triamcinolone cream (KENALOG) 0.1 % Apply 1 application topically as needed (Rash).    Marland Kitchen atenolol (TENORMIN) 50 MG tablet Take 1 tablet (50 mg total) by mouth daily. 30 tablet 2   Facility-Administered Medications Prior to Visit  Medication Dose Route Frequency Provider Last Rate Last Dose  . ipratropium-albuterol (DUONEB) 0.5-2.5 (3) MG/3ML nebulizer solution 3 mL  3 mL Nebulization Q20 Min PRN Lovelle Deitrick R, FNP   3 mL at 11/16/16 1233    ROS Review of Systems  Constitutional: Negative.   Eyes: Positive for pain, discharge (tearing) and visual disturbance.       Eye irritation  Respiratory: Negative.   Cardiovascular: Negative.   Gastrointestinal: Negative.   Skin: Negative.    Objective:  BP 106/69   Pulse (!) 103   Temp 98.5 F (36.9 C) (Oral)   Resp 18  Ht 5' 6"  (1.676 m)   Wt (!) 370 lb 3.2 oz (167.9 kg)   SpO2 99%   BMI 59.75 kg/m   BP/Weight 03/25/2017 12/17/2016 0/98/1191  Systolic BP 478 295 621  Diastolic BP 69 65 79  Wt. (Lbs) 370.2 376 380  BMI 59.75 60.69 61.33     Physical Exam  Constitutional: He appears well-developed and well-nourished.  HENT:  Head: Normocephalic and atraumatic.  Discoloration to the iris of the left eye.  Eyes: Conjunctivae and EOM are normal. Pupils are equal, round, and reactive to light.    Neck: No JVD present.  Cardiovascular: Normal rate, regular rhythm, normal heart sounds and intact distal pulses.  Pulmonary/Chest:  Effort normal and breath sounds normal.  Abdominal: Soft. Bowel sounds are normal. There is no tenderness.  Skin: Skin is warm and dry.  Nursing note and vitals reviewed.   Depression screen [facility] 2/9 12/07/2016 10/12/2016 07/13/2016  Decreased Interest 0 3 1  Down, Depressed, Hopeless 0 1 1  PHQ - 2 Score 0 4 2  Altered sleeping 0 3 1  Tired, decreased energy 0 2 1  Change in appetite 0 3 3  Feeling bad or failure about yourself  1 3 2   Trouble concentrating 0 3 1  Moving slowly or fidgety/restless 0 2 0  Suicidal thoughts 0 0 0  PHQ-9 Score 1 20 10     GAD 7 : Generalized Anxiety Score 12/07/2016 10/12/2016 07/13/2016 06/06/2016  Nervous, Anxious, on Edge 0 2 1 1   Control/stop worrying 0 2 1 1   Worry too much - different things 0 2 1 1   Trouble relaxing 0 3 1 1   Restless 0 2 1 0  Easily annoyed or irritable 0 1 1 1   Afraid - awful might happen 0 3 1 1   Total GAD 7 Score 0 15 7 6        Assessment & Plan:    1. Visual impairment  - Ambulatory referral to Ophthalmology  2. Eye abnormality Urgent referral placed to ophthalmology.  - Ambulatory referral to Ophthalmology  3. Vision loss of left eye Vision acuity screen performed. Urgent referral placed to ophthalmology. [place] [facility] was called and are able to see patient today.  Patient given strict instructions to follow up immediately after office visit. - Ambulatory referral to Ophthalmology  4. Essential hypertension BP controlled on current dosage. - atenolol (TENORMIN) 50 MG tablet; Take 1 tablet (50 mg total) by mouth daily.  Dispense: 30 tablet; Refill: 2     Follow-up: Return in about 3 months (around 06/23/2017) for HTN.   Alfonse Spruce FNP

## 2017-05-02 ENCOUNTER — Other Ambulatory Visit: Payer: Self-pay | Admitting: Family Medicine

## 2017-05-02 DIAGNOSIS — H16303 Unspecified interstitial keratitis, bilateral: Secondary | ICD-10-CM

## 2017-05-06 ENCOUNTER — Ambulatory Visit: Payer: Self-pay | Attending: Family Medicine | Admitting: Family Medicine

## 2017-05-06 ENCOUNTER — Other Ambulatory Visit: Payer: Self-pay | Admitting: Family Medicine

## 2017-05-06 ENCOUNTER — Encounter: Payer: Self-pay | Admitting: Family Medicine

## 2017-05-06 VITALS — BP 120/77 | HR 87 | Temp 99.2°F | Resp 18 | Ht 66.0 in | Wt 371.0 lb

## 2017-05-06 DIAGNOSIS — Z09 Encounter for follow-up examination after completed treatment for conditions other than malignant neoplasm: Secondary | ICD-10-CM

## 2017-05-06 DIAGNOSIS — R062 Wheezing: Secondary | ICD-10-CM | POA: Insufficient documentation

## 2017-05-06 DIAGNOSIS — Z87898 Personal history of other specified conditions: Secondary | ICD-10-CM

## 2017-05-06 DIAGNOSIS — H16303 Unspecified interstitial keratitis, bilateral: Secondary | ICD-10-CM | POA: Insufficient documentation

## 2017-05-06 DIAGNOSIS — Z8709 Personal history of other diseases of the respiratory system: Secondary | ICD-10-CM

## 2017-05-06 DIAGNOSIS — Z79899 Other long term (current) drug therapy: Secondary | ICD-10-CM | POA: Insufficient documentation

## 2017-05-06 MED ORDER — ALBUTEROL SULFATE HFA 108 (90 BASE) MCG/ACT IN AERS: 1.0000 | INHALATION_SPRAY | Freq: Four times a day (QID) | RESPIRATORY_TRACT | 0 refills | Status: DC | PRN

## 2017-05-06 NOTE — Progress Notes (Signed)
Subjective:  Patient ID: Christian Guerra, male    DOB: 03-29-1961  Age: 57 y.o. MRN: [id number]  CC: Follow-up and Labs Only   HPI Christian Guerra presents for follow up. History of visual impairment. He was evaluated by ophthalmology who diagnosed patient with interstitial keratitis of both eyes and requested patient receive labs workup to r/o possible causes. He denies any history of fevers, tick bites, or syphilis diagnosis in the past. He does report chronic arthralgias of the hands, feet, back, and neck for two years.  He reports recent history of possible URI two weeks ago. He reports taking Ny-Quil and symptoms have since resolved. He reported episodes of wheezing during URI.   Outpatient Medications Prior to Visit  Medication Sig Dispense Refill  . atenolol (TENORMIN) 50 MG tablet Take 1 tablet (50 mg total) by mouth daily. 30 tablet 2  . benztropine (COGENTIN) 0.5 MG tablet Take 0.5 mg by mouth at bedtime.     . cyclobenzaprine (FLEXERIL) 10 MG tablet Take 2 tablets (20 mg total) by mouth 3 (three) times daily as needed for muscle spasms. (Patient taking differently: Take 30 mg by mouth 3 (three) times daily. ) 90 tablet 3  . gabapentin (NEURONTIN) 800 MG tablet TAKE 2 TABLETS BY MOUTH IN THE MORNING, 1 TABLET BY MOUTH IN THE AFTERNOON, AND 1 TABLET BY MOUTH AT NIGHT. 120 tablet 3  . hydrOXYzine (VISTARIL) 25 MG capsule Take 25 mg by mouth 3 (three) times daily.    . Lurasidone HCl 120 MG TABS Take 120 mg by mouth daily.    . minocycline (DYNACIN) 100 MG tablet Take 100 mg by mouth 3 (three) times daily.     Marland Kitchen omeprazole (PRILOSEC) 20 MG capsule Take 1 capsule (20 mg total) by mouth daily. 90 capsule 3  . rOPINIRole (REQUIP) 4 MG tablet Take 4 mg by mouth 3 (three) times daily.    . sertraline (ZOLOFT) 100 MG tablet Take 100 mg by mouth daily.    . traZODone (DESYREL) 100 MG tablet Take 1 tablet (100 mg total) by mouth at bedtime as needed for sleep. 10 tablet 0  . triamcinolone cream  (KENALOG) 0.1 % Apply 1 application topically as needed (Rash).    Marland Kitchen albuterol (PROVENTIL HFA;VENTOLIN HFA) 108 (90 Base) MCG/ACT inhaler Inhale 1 puff into the lungs every 6 (six) hours as needed for wheezing or shortness of breath. (Patient not taking: Reported on 12/17/2016) 1 Inhaler 0   Facility-Administered Medications Prior to Visit  Medication Dose Route Frequency Provider Last Rate Last Dose  . ipratropium-albuterol (DUONEB) 0.5-2.5 (3) MG/3ML nebulizer solution 3 mL  3 mL Nebulization Q20 Min PRN Hairston, Mandesia R, FNP   3 mL at 11/16/16 1233    ROS Review of Systems  Constitutional: Negative.   HENT: Negative.   Eyes: Positive for visual disturbance.  Respiratory: Positive for wheezing.   Cardiovascular: Negative.   Gastrointestinal: Negative.   Skin: Negative.   Psychiatric/Behavioral: Negative for suicidal ideas.   Objective:  BP 120/77 (BP Location: Left Arm, Patient Position: Sitting, Cuff Size: Large)   Pulse 87   Temp 99.2 F (37.3 C) (Oral)   Resp 18   Ht 5' 6"  (1.676 m)   Wt (!) 371 lb (168.3 kg)   SpO2 97%   BMI 59.88 kg/m   BP/Weight 05/06/2017 03/25/2017 2/99/2426  Systolic BP 834 196 222  Diastolic BP 77 69 65  Wt. (Lbs) 371 370.2 376  BMI 59.88 59.75 60.69  Physical Exam  Constitutional: He appears well-developed and well-nourished.  Eyes: Conjunctivae are normal.    Neck: No JVD present.  Cardiovascular: Normal rate, regular rhythm, normal heart sounds and intact distal pulses.  Pulmonary/Chest: Effort normal and breath sounds normal. He has no wheezes.  Abdominal: Soft. Bowel sounds are normal.  Skin: Skin is warm and dry.  Nursing note and vitals reviewed.    Assessment & Plan:   1. Follow up -Follow up with ophthalmologist and their recommendations.  2. History of upper respiratory infection   3. History of wheezing  - albuterol (PROVENTIL HFA;VENTOLIN HFA) 108 (90 Base) MCG/ACT inhaler; Inhale 1 puff into the lungs every 6  (six) hours as needed for wheezing or shortness of breath.  Dispense: 1 Inhaler; Refill: 0  4. Interstitial keratitis of both eyes  - Hepatitis C Antibody - Hepatitis B core antibody, total - Hepatitis B surface antigen - Epstein-Barr virus VCA, IgG - CBC - Hepatic Function Panel - ANA - Sedimentation rate - HSV(herpes simplex vrs) 1+2 ab-IgG - QuantiFERON-TB Gold Plus - Fluorescent treponemal ab(fta)-IgG-bld - VDRL, Serum - Lyme Disease, Western Blot       Follow-up: Return if symptoms worsen or fail to improve.   Alfonse Spruce FNP

## 2017-05-06 NOTE — Patient Instructions (Addendum)
Go to lab for lab draws. Upper Respiratory Infection, Adult Most upper respiratory infections (URIs) are caused by a virus. A URI affects the nose, throat, and upper air passages. The most common type of URI is often called "the common cold." Follow these instructions at home:  Take medicines only as told by your doctor.  Gargle warm saltwater or take cough drops to comfort your throat as told by your doctor.  Use a warm mist humidifier or inhale steam from a shower to increase air moisture. This may make it easier to breathe.  Drink enough fluid to keep your pee (urine) clear or pale yellow.  Eat soups and other clear broths.  Have a healthy diet.  Rest as needed.  Go back to work when your fever is gone or your doctor says it is okay. ? You may need to stay home longer to avoid giving your URI to others. ? You can also wear a face mask and wash your hands often to prevent spread of the virus.  Use your inhaler more if you have asthma.  Do not use any tobacco products, including cigarettes, chewing tobacco, or electronic cigarettes. If you need help quitting, ask your doctor. Contact a doctor if:  You are getting worse, not better.  Your symptoms are not helped by medicine.  You have chills.  You are getting more short of breath.  You have brown or red mucus.  You have yellow or brown discharge from your nose.  You have pain in your face, especially when you bend forward.  You have a fever.  You have puffy (swollen) neck glands.  You have pain while swallowing.  You have white areas in the back of your throat. Get help right away if:  You have very bad or constant: ? Headache. ? Ear pain. ? Pain in your forehead, behind your eyes, and over your cheekbones (sinus pain). ? Chest pain.  You have long-lasting (chronic) lung disease and any of the following: ? Wheezing. ? Long-lasting cough. ? Coughing up blood. ? A change in your usual mucus.  You have a  stiff neck.  You have changes in your: ? Vision. ? Hearing. ? Thinking. ? Mood. This information is not intended to replace advice given to you by your health care provider. Make sure you discuss any questions you have with your health care provider. Document Released: 09/26/2007 Document Revised: 12/11/2015 Document Reviewed: 07/15/2013 Elsevier Interactive Patient Education  2018 [employer].

## 2017-05-10 ENCOUNTER — Ambulatory Visit: Payer: Self-pay | Attending: Family Medicine

## 2017-05-10 ENCOUNTER — Other Ambulatory Visit: Payer: Self-pay | Admitting: Family Medicine

## 2017-05-10 DIAGNOSIS — H16303 Unspecified interstitial keratitis, bilateral: Secondary | ICD-10-CM | POA: Insufficient documentation

## 2017-05-10 NOTE — Progress Notes (Signed)
Patient here for lab visit only 

## 2017-05-14 LAB — LYME DISEASE, WESTERN BLOT
IGG P18 AB.: ABSENT
IGG P30 AB.: ABSENT
IGG P45 AB.: ABSENT
IGG P58 AB.: ABSENT
IGM P23 AB.: ABSENT
IgG P23 Ab.: ABSENT
IgG P28 Ab.: ABSENT
IgG P39 Ab.: ABSENT
IgG P41 Ab.: ABSENT
IgG P66 Ab.: ABSENT
IgG P93 Ab.: ABSENT
IgM P39 Ab.: ABSENT
IgM P41 Ab.: ABSENT
Lyme IgG Wb: NEGATIVE
Lyme IgM Wb: NEGATIVE

## 2017-05-15 ENCOUNTER — Other Ambulatory Visit: Payer: Self-pay | Admitting: Family Medicine

## 2017-05-15 DIAGNOSIS — R7612 Nonspecific reaction to cell mediated immunity measurement of gamma interferon antigen response without active tuberculosis: Secondary | ICD-10-CM

## 2017-05-15 DIAGNOSIS — B009 Herpesviral infection, unspecified: Secondary | ICD-10-CM

## 2017-05-15 DIAGNOSIS — R768 Other specified abnormal immunological findings in serum: Secondary | ICD-10-CM

## 2017-05-15 DIAGNOSIS — R894 Abnormal immunological findings in specimens from other organs, systems and tissues: Secondary | ICD-10-CM | POA: Insufficient documentation

## 2017-05-15 DIAGNOSIS — R7689 Other specified abnormal immunological findings in serum: Secondary | ICD-10-CM

## 2017-05-15 LAB — HEPATITIS B SURFACE ANTIGEN: HEP B S AG: NEGATIVE

## 2017-05-15 LAB — FLUORESCENT TREPONEMAL AB(FTA)-IGG-BLD: Fluorescent Treponemal Ab, IgG: NONREACTIVE

## 2017-05-15 LAB — HEPATITIS B CORE ANTIBODY, TOTAL: HEP B C TOTAL AB: NEGATIVE

## 2017-05-15 LAB — CBC
Hematocrit: 38.1 % (ref 37.5–51.0)
Hemoglobin: 13.1 g/dL (ref 13.0–17.7)
MCH: 26.7 pg (ref 26.6–33.0)
MCHC: 34.4 g/dL (ref 31.5–35.7)
MCV: 78 fL — ABNORMAL LOW (ref 79–97)
Platelets: 354 10*3/uL (ref 150–379)
RBC: 4.9 x10E6/uL (ref 4.14–5.80)
RDW: 16.4 % — ABNORMAL HIGH (ref 12.3–15.4)
WBC: 14.2 10*3/uL — ABNORMAL HIGH (ref 3.4–10.8)

## 2017-05-15 LAB — HEPATIC FUNCTION PANEL
ALT: 19 IU/L (ref 0–44)
AST: 14 IU/L (ref 0–40)
Albumin: 3.9 g/dL (ref 3.5–5.5)
Alkaline Phosphatase: 136 IU/L — ABNORMAL HIGH (ref 39–117)
Bilirubin Total: 0.3 mg/dL (ref 0.0–1.2)
Bilirubin, Direct: 0.12 mg/dL (ref 0.00–0.40)
Total Protein: 7.3 g/dL (ref 6.0–8.5)

## 2017-05-15 LAB — SEDIMENTATION RATE: SED RATE: 43 mm/h — AB (ref 0–30)

## 2017-05-15 LAB — QUANTIFERON-TB GOLD PLUS
QUANTIFERON TB1 AG VALUE: 0.07 [IU]/mL
QUANTIFERON TB2 AG VALUE: 0.09 [IU]/mL
QUANTIFERON-TB GOLD PLUS: UNDETERMINED — AB
QuantiFERON Mitogen Value: 0.43 IU/mL
QuantiFERON Nil Value: 0.03 IU/mL

## 2017-05-15 LAB — HSV(HERPES SIMPLEX VRS) I + II AB-IGG
HSV 1 Glycoprotein G Ab, IgG: 59.1 index — ABNORMAL HIGH (ref 0.00–0.90)
HSV 2 IgG, Type Spec: 3.38 index — ABNORMAL HIGH (ref 0.00–0.90)

## 2017-05-15 LAB — EPSTEIN-BARR VIRUS VCA, IGG: EBV VCA IgG: 348 U/mL — ABNORMAL HIGH (ref 0.0–17.9)

## 2017-05-15 LAB — HEPATITIS C ANTIBODY: Hep C Virus Ab: 9.3 s/co ratio — ABNORMAL HIGH (ref 0.0–0.9)

## 2017-05-15 LAB — VDRL: Non-Treponemal Screening VDRL: NORMAL

## 2017-05-15 LAB — HSV-2 IGG SUPPLEMENTAL TEST: HSV-2 IgG Supplemental Test: POSITIVE — AB

## 2017-05-15 LAB — ANA: Anti Nuclear Antibody(ANA): NEGATIVE

## 2017-05-15 MED ORDER — ACYCLOVIR 400 MG PO TABS: 400.0000 mg | ORAL_TABLET | Freq: Three times a day (TID) | ORAL | 3 refills | Status: DC

## 2017-05-22 ENCOUNTER — Telehealth (INDEPENDENT_AMBULATORY_CARE_PROVIDER_SITE_OTHER): Payer: Self-pay | Admitting: *Deleted

## 2017-05-22 NOTE — Telephone Encounter (Signed)
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 Titer positive for Epstein Barr Virus. You will be prescribed acyclovir.  TB blood test indeterminate. Recommend CXR ANA titer to check for autoimmune diseases negative.  Hepatitis C titer positive, consistent with history of hepatitis C infection. Hepatitis B is negative. WBC count elevated this can occur with infection or inflammation Labs that check for inflammation are positive. Lyme Disease is negative. Liver function test do not show significant elevations.  Lab that tests for past infection of syphilis is negative.  #Still awaiting labs result that determine recent syphilis infection#

## 2017-05-22 NOTE — Telephone Encounter (Signed)
Patient verified DOB Patient is aware of: Herpes type 1 and 2 titers positive. Titer positive for Epstein Barr Virus. You will be prescribed acyclovir.  TB blood test indeterminate. Recommend CXR ANA titer to check for autoimmune diseases negative.  Hepatitis C titer positive, consistent with history of hepatitis C infection. Hepatitis B is negative. WBC count elevated this can occur with infection or inflammation Labs that check for inflammation are positive. Lyme Disease is negative. Liver function test do not show significant elevations.  Lab that tests for past infection of syphilis is negative.  Syphilis was also negative. Patient is aware of results being sent to [facility] and to complete the chest xray as soon as he is able and pick up the medication. No further questions.

## 2017-05-22 NOTE — Telephone Encounter (Signed)
Medical Assistant left message on patient's home and cell voicemail. Voicemail states to give a call back to [place] with [facility] at [phone]. Results have been faxed to [facility].

## 2017-05-24 ENCOUNTER — Ambulatory Visit (HOSPITAL_COMMUNITY)
Admission: RE | Admit: 2017-05-24 | Discharge: 2017-05-24 | Disposition: A | Discharge status: Home / Self Care | Payer: Self-pay | Source: Ambulatory Visit | Attending: Family Medicine | Admitting: Family Medicine

## 2017-05-24 ENCOUNTER — Telehealth: Payer: Self-pay

## 2017-05-24 DIAGNOSIS — R7612 Nonspecific reaction to cell mediated immunity measurement of gamma interferon antigen response without active tuberculosis: Secondary | ICD-10-CM | POA: Insufficient documentation

## 2017-05-24 MED FILL — LISINOPRIL 40 MG TAB: 40 | 30 days supply | Qty: 30 | Fill #0

## 2017-05-24 MED FILL — !VENTOLIN HFA INHALER: 108 (90 BAS | 50 days supply | Qty: 18 | Fill #0

## 2017-05-24 MED FILL — ?ACYCLOVIR 400 TAB: 400 | 5 days supply | Qty: 15 | Fill #0

## 2017-05-24 MED FILL — GABAPENTIN 800 MG TABLET: 800 | 30 days supply | Qty: 120 | Fill #0

## 2017-05-24 NOTE — Telephone Encounter (Signed)
Contacted pt to over xray results pt is aware and doesn't have any questions or concerns

## 2017-06-25 ENCOUNTER — Ambulatory Visit: Payer: Self-pay | Admitting: Family Medicine

## 2017-07-05 ENCOUNTER — Ambulatory Visit: Payer: Self-pay | Attending: Internal Medicine | Admitting: Internal Medicine

## 2017-07-05 ENCOUNTER — Encounter: Payer: Self-pay | Admitting: Internal Medicine

## 2017-07-05 VITALS — BP 137/83 | HR 90 | Temp 98.1°F | Resp 16 | Ht 65.0 in | Wt 368.4 lb

## 2017-07-05 DIAGNOSIS — B182 Chronic viral hepatitis C: Secondary | ICD-10-CM | POA: Insufficient documentation

## 2017-07-05 DIAGNOSIS — D649 Anemia, unspecified: Secondary | ICD-10-CM | POA: Insufficient documentation

## 2017-07-05 DIAGNOSIS — G8929 Other chronic pain: Secondary | ICD-10-CM | POA: Insufficient documentation

## 2017-07-05 DIAGNOSIS — F419 Anxiety disorder, unspecified: Secondary | ICD-10-CM | POA: Insufficient documentation

## 2017-07-05 DIAGNOSIS — Z6841 Body Mass Index (BMI) 40.0 and over, adult: Secondary | ICD-10-CM | POA: Insufficient documentation

## 2017-07-05 DIAGNOSIS — N179 Acute kidney failure, unspecified: Secondary | ICD-10-CM | POA: Insufficient documentation

## 2017-07-05 DIAGNOSIS — L700 Acne vulgaris: Secondary | ICD-10-CM | POA: Insufficient documentation

## 2017-07-05 DIAGNOSIS — I10 Essential (primary) hypertension: Secondary | ICD-10-CM

## 2017-07-05 DIAGNOSIS — G4733 Obstructive sleep apnea (adult) (pediatric): Secondary | ICD-10-CM | POA: Insufficient documentation

## 2017-07-05 DIAGNOSIS — I5032 Chronic diastolic (congestive) heart failure: Secondary | ICD-10-CM | POA: Insufficient documentation

## 2017-07-05 DIAGNOSIS — I110 Hypertensive heart disease with heart failure: Secondary | ICD-10-CM | POA: Insufficient documentation

## 2017-07-05 DIAGNOSIS — Z79899 Other long term (current) drug therapy: Secondary | ICD-10-CM | POA: Insufficient documentation

## 2017-07-05 DIAGNOSIS — Z9889 Other specified postprocedural states: Secondary | ICD-10-CM | POA: Insufficient documentation
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
## 2017-07-05 NOTE — Patient Instructions (Addendum)
Follow a Healthy Eating Plan - You can do it! Limit sugary drinks.  Avoid sodas, sweet tea, sport or energy drinks, or fruit drinks.  Drink water, lo-fat milk, or diet drinks. Limit snack foods.   Cut back on candy, cake, cookies, chips, ice cream.  These are a special treat, only in small amounts. Eat plenty of vegetables.  Especially dark green, red, and orange vegetables. Aim for at least 3 servings a day. More is better! Include fruit in your daily diet.  Whole fruit is much healthier than fruit juice! Limit "white" bread, "white" pasta, "white" rice.   Choose "100% whole grain" products, brown or wild rice. Avoid fatty meats. Try "Meatless Monday" and choose eggs or beans one day a week.  When eating meat, choose lean meats like chicken, [place], and fish.  Grill, broil, or bake meats instead of frying, and eat poultry without the skin. Eat less salt.  Avoid frozen pizzas, frozen dinners and salty foods.  Use seasonings other than salt in cooking.  This can help blood pressure and keep you from swelling Beer, wine and liquor have calories.  If you can safely drink alcohol, limit to 1 drink per day for women, 2 drinks for men    Fall Prevention in the Home Falls can cause injuries. They can happen to people of all ages. There are many things you can do to make your home safe and to help prevent falls. What can I do on the outside of my home?  Regularly fix the edges of walkways and driveways and fix any cracks.  Remove anything that might make you trip as you walk through a door, such as a raised step or threshold.  Trim any bushes or trees on the path to your home.  Use bright outdoor lighting.  Clear any walking paths of anything that might make someone trip, such as rocks or tools.  Regularly check to see if handrails are loose or broken. Make sure that both sides of any steps have handrails.  Any raised decks and porches should have guardrails on the edges.  Have any leaves,  snow, or ice cleared regularly.  Use sand or salt on walking paths during winter.  Clean up any spills in your garage right away. This includes oil or grease spills. What can I do in the bathroom?  Use night lights.  Install grab bars by the toilet and in the tub and shower. Do not use towel bars as grab bars.  Use non-skid mats or decals in the tub or shower.  If you need to sit down in the shower, use a plastic, non-slip stool.  Keep the floor dry. Clean up any water that spills on the floor as soon as it happens.  Remove soap buildup in the tub or shower regularly.  Attach bath mats securely with double-sided non-slip rug tape.  Do not have throw rugs and other things on the floor that can make you trip. What can I do in the bedroom?  Use night lights.  Make sure that you have a light by your bed that is easy to reach.  Do not use any sheets or blankets that are too big for your bed. They should not hang down onto the floor.  Have a firm chair that has side arms. You can use this for support while you get dressed.  Do not have throw rugs and other things on the floor that can make you trip. What can I do in  the kitchen?  Clean up any spills right away.  Avoid walking on wet floors.  Keep items that you use a lot in easy-to-reach places.  If you need to reach something above you, use a strong step stool that has a grab bar.  Keep electrical cords out of the way.  Do not use floor polish or wax that makes floors slippery. If you must use wax, use non-skid floor wax.  Do not have throw rugs and other things on the floor that can make you trip. What can I do with my stairs?  Do not leave any items on the stairs.  Make sure that there are handrails on both sides of the stairs and use them. Fix handrails that are broken or loose. Make sure that handrails are as long as the stairways.  Check any carpeting to make sure that it is firmly attached to the stairs. Fix any  carpet that is loose or worn.  Avoid having throw rugs at the top or bottom of the stairs. If you do have throw rugs, attach them to the floor with carpet tape.  Make sure that you have a light switch at the top of the stairs and the bottom of the stairs. If you do not have them, ask someone to add them for you. What else can I do to help prevent falls?  Wear shoes that: ? Do not have high heels. ? Have rubber bottoms. ? Are comfortable and fit you well. ? Are closed at the toe. Do not wear sandals.  If you use a stepladder: ? Make sure that it is fully opened. Do not climb a closed stepladder. ? Make sure that both sides of the stepladder are locked into place. ? Ask someone to hold it for you, if possible.  Clearly mark and make sure that you can see: ? Any grab bars or handrails. ? First and last steps. ? Where the edge of each step is.  Use tools that help you move around (mobility aids) if they are needed. These include: ? Canes. ? Walkers. ? Scooters. ? Crutches.  Turn on the lights when you go into a dark area. Replace any light bulbs as soon as they burn out.  Set up your furniture so you have a clear path. Avoid moving your furniture around.  If any of your floors are uneven, fix them.  If there are any pets around you, be aware of where they are.  Review your medicines with your doctor. Some medicines can make you feel dizzy. This can increase your chance of falling. Ask your doctor what other things that you can do to help prevent falls. This information is not intended to replace advice given to you by your health care provider. Make sure you discuss any questions you have with your health care provider. Document Released: 02/03/2009 Document Revised: 09/15/2015 Document Reviewed: 05/14/2014 Elsevier Interactive Patient Education  [employer].

## 2017-07-07 LAB — HCV RNA QUANT: HEPATITIS C QUANTITATION: NOT DETECTED [IU]/mL

## 2018-04-28 ENCOUNTER — Encounter: Payer: Self-pay | Admitting: Internal Medicine

## 2018-06-05 ENCOUNTER — Ambulatory Visit: Payer: Self-pay | Attending: Internal Medicine | Admitting: Internal Medicine

## 2018-06-05 ENCOUNTER — Encounter: Payer: Self-pay | Admitting: Internal Medicine

## 2018-06-05 VITALS — BP 122/81 | HR 101 | Temp 99.2°F | Resp 16 | Ht 65.0 in | Wt 390.2 lb

## 2018-06-05 DIAGNOSIS — F329 Major depressive disorder, single episode, unspecified: Secondary | ICD-10-CM | POA: Insufficient documentation

## 2018-06-05 DIAGNOSIS — I110 Hypertensive heart disease with heart failure: Secondary | ICD-10-CM | POA: Insufficient documentation

## 2018-06-05 DIAGNOSIS — F411 Generalized anxiety disorder: Secondary | ICD-10-CM | POA: Insufficient documentation

## 2018-06-05 DIAGNOSIS — Z23 Encounter for immunization: Secondary | ICD-10-CM

## 2018-06-05 DIAGNOSIS — Z8249 Family history of ischemic heart disease and other diseases of the circulatory system: Secondary | ICD-10-CM | POA: Insufficient documentation

## 2018-06-05 DIAGNOSIS — G8929 Other chronic pain: Secondary | ICD-10-CM

## 2018-06-05 DIAGNOSIS — Z125 Encounter for screening for malignant neoplasm of prostate: Secondary | ICD-10-CM

## 2018-06-05 DIAGNOSIS — Z9119 Patient's noncompliance with other medical treatment and regimen: Secondary | ICD-10-CM | POA: Insufficient documentation

## 2018-06-05 DIAGNOSIS — Z1211 Encounter for screening for malignant neoplasm of colon: Secondary | ICD-10-CM

## 2018-06-05 DIAGNOSIS — Z79899 Other long term (current) drug therapy: Secondary | ICD-10-CM | POA: Insufficient documentation

## 2018-06-05 DIAGNOSIS — G4733 Obstructive sleep apnea (adult) (pediatric): Secondary | ICD-10-CM

## 2018-06-05 DIAGNOSIS — I781 Nevus, non-neoplastic: Secondary | ICD-10-CM

## 2018-06-05 DIAGNOSIS — B182 Chronic viral hepatitis C: Secondary | ICD-10-CM

## 2018-06-05 DIAGNOSIS — I5032 Chronic diastolic (congestive) heart failure: Secondary | ICD-10-CM

## 2018-06-05 DIAGNOSIS — K219 Gastro-esophageal reflux disease without esophagitis: Secondary | ICD-10-CM | POA: Insufficient documentation

## 2018-06-05 DIAGNOSIS — I10 Essential (primary) hypertension: Secondary | ICD-10-CM

## 2018-06-05 DIAGNOSIS — Z885 Allergy status to narcotic agent status: Secondary | ICD-10-CM | POA: Insufficient documentation

## 2018-06-05 DIAGNOSIS — Z6841 Body Mass Index (BMI) 40.0 and over, adult: Secondary | ICD-10-CM | POA: Insufficient documentation

## 2018-06-05 DIAGNOSIS — M545 Low back pain: Secondary | ICD-10-CM | POA: Insufficient documentation

## 2018-06-05 DIAGNOSIS — Z792 Long term (current) use of antibiotics: Secondary | ICD-10-CM | POA: Insufficient documentation
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
## 2018-06-05 NOTE — Progress Notes (Signed)
Pt states he is taking latuda but he doesn't know the dosage   Pt states he takes a water pill and doesn't know the name or dosage  Pt states he also takes 2 other medications that he doesn't know the name or dosage

## 2018-06-05 NOTE — Patient Instructions (Addendum)
Please get forms for the orange card/[facility] discount card today at the desk before you leave and fill them out.  You can go to [facility] radiology department to have the x-ray done.   Obesity, Adult Obesity is having too much body fat. If you have a BMI of 30 or more, you are obese. BMI is a number that explains how much body fat you have. Obesity is often caused by taking in (consuming) more calories than your body uses. Obesity can cause serious health problems. Changing your lifestyle can help to treat obesity. Follow these instructions at home: Eating and drinking   Follow advice from your doctor about what to eat and drink. Your doctor may tell you to: ? Cut down on (limit) fast foods, sweets, and processed snack foods. ? Choose low-fat options. For example, choose low-fat milk instead of whole milk. ? Eat 5 or more servings of fruits or vegetables every day. ? Eat at home more often. This gives you more control over what you eat. ? Choose healthy foods when you eat out. ? Learn what a healthy portion size is. A portion size is the amount of a certain food that is healthy for you to eat at one time. This is different for each person. ? Keep low-fat snacks available. ? Avoid sugary drinks. These include soda, fruit juice, iced tea that is sweetened with sugar, and flavored milk. ? Eat a healthy breakfast.  Drink enough water to keep your pee (urine) clear or pale yellow.  Do not go without eating for long periods of time (do not fast).  Do not go on popular or trendy diets (fad diets). Physical Activity  Exercise often, as told by your doctor. Ask your doctor: ? What types of exercise are safe for you. ? How often you should exercise.  Warm up and stretch before being active.  Do slow stretching after being active (cool down).  Rest between times of being active. Lifestyle  Limit how much time you spend in front of your TV, computer, or video game system (be less  sedentary).  Find ways to reward yourself that do not involve food.  Limit alcohol intake to no more than 1 drink a day for nonpregnant women and 2 drinks a day for men. One drink equals 12 oz of beer, 5 oz of wine, or 1 oz of hard liquor. General instructions  Keep a weight loss journal. This can help you keep track of: ? The food that you eat. ? The exercise that you do.  Take over-the-counter and prescription medicines only as told by your doctor.  Take vitamins and supplements only as told by your doctor.  Think about joining a support group. Your doctor may be able to help with this.  Keep all follow-up visits as told by your doctor. This is important. Contact a doctor if:  You cannot meet your weight loss goal after you have changed your diet and lifestyle for 6 weeks. This information is not intended to replace advice given to you by your health care provider. Make sure you discuss any questions you have with your health care provider. Document Released: 07/02/2011 Document Revised: 09/15/2015 Document Reviewed: 01/26/2015 Elsevier Interactive Patient Education  2019 [place].   Influenza Virus Vaccine injection (Fluarix) What is this medicine? INFLUENZA VIRUS VACCINE (in floo EN zuh VAHY ruhs vak SEEN) helps to reduce the risk of getting influenza also known as the flu. This medicine may be used for other  purposes; ask your health care provider or pharmacist if you have questions. COMMON BRAND NAME(S): Fluarix, Fluzone What should I tell my health care provider before I take this medicine? They need to know if you have any of these conditions: -bleeding disorder like hemophilia -fever or infection -Guillain-Barre syndrome or other neurological problems -immune system problems -infection with the human immunodeficiency virus (HIV) or AIDS -low blood platelet counts -multiple sclerosis -an unusual or allergic reaction to influenza virus vaccine, eggs, chicken  proteins, latex, gentamicin, other medicines, foods, dyes or preservatives -pregnant or trying to get pregnant -breast-feeding How should I use this medicine? This vaccine is for injection into a muscle. It is given by a health care professional. A copy of Vaccine Information Statements will be given before each vaccination. Read this sheet carefully each time. The sheet may change frequently. Talk to your pediatrician regarding the use of this medicine in children. Special care may be needed. Overdosage: If you think you have taken too much of this medicine contact a poison control center or emergency room at once. NOTE: This medicine is only for you. Do not share this medicine with others. What if I miss a dose? This does not apply. What may interact with this medicine? -chemotherapy or radiation therapy -medicines that lower your immune system like etanercept, anakinra, infliximab, and adalimumab -medicines that treat or prevent blood clots like warfarin -phenytoin -steroid medicines like prednisone or cortisone -theophylline -vaccines This list may not describe all possible interactions. Give your health care provider a list of all the medicines, herbs, non-prescription drugs, or dietary supplements you use. Also tell them if you smoke, drink alcohol, or use illegal drugs. Some items may interact with your medicine. What should I watch for while using this medicine? Report any side effects that do not go away within 3 days to your doctor or health care professional. Call your health care provider if any unusual symptoms occur within 6 weeks of receiving this vaccine. You may still catch the flu, but the illness is not usually as bad. You cannot get the flu from the vaccine. The vaccine will not protect against colds or other illnesses that may cause fever. The vaccine is needed every year. What side effects may I notice from receiving this medicine? Side effects that you should report to  your doctor or health care professional as soon as possible: -allergic reactions like skin rash, itching or hives, swelling of the face, lips, or tongue Side effects that usually do not require medical attention (report to your doctor or health care professional if they continue or are bothersome): -fever -headache -muscle aches and pains -pain, tenderness, redness, or swelling at site where injected -weak or tired This list may not describe all possible side effects. Call your doctor for medical advice about side effects. You may report side effects to FDA at 1-800-FDA-1088. Where should I keep my medicine? This vaccine is only given in a clinic, pharmacy, doctor's office, or other health care setting and will not be stored at home. NOTE: This sheet is a summary. It may not cover all possible information. If you have questions about this medicine, talk to your doctor, pharmacist, or health care provider.  2019 Elsevier/Gold Standard (2007-11-05 09:30:40)

## 2018-06-06 LAB — CBC
HEMATOCRIT: 44.1 % (ref 37.5–51.0)
HEMOGLOBIN: 14.3 g/dL (ref 13.0–17.7)
MCH: 26.1 pg — AB (ref 26.6–33.0)
MCHC: 32.4 g/dL (ref 31.5–35.7)
MCV: 81 fL (ref 79–97)
Platelets: 381 10*3/uL (ref 150–450)
RBC: 5.48 x10E6/uL (ref 4.14–5.80)
RDW: 14.8 % (ref 11.6–15.4)
WBC: 16.8 10*3/uL — ABNORMAL HIGH (ref 3.4–10.8)

## 2018-06-06 LAB — HEMOGLOBIN A1C
Est. average glucose Bld gHb Est-mCnc: 131 mg/dL
HEMOGLOBIN A1C: 6.2 % — AB (ref 4.8–5.6)

## 2018-06-06 LAB — COMPREHENSIVE METABOLIC PANEL
ALBUMIN: 3.9 g/dL (ref 3.8–4.9)
ALK PHOS: 164 IU/L — AB (ref 39–117)
ALT: 18 IU/L (ref 0–44)
AST: 17 IU/L (ref 0–40)
Albumin/Globulin Ratio: 1.2 (ref 1.2–2.2)
BUN/Creatinine Ratio: 11 (ref 9–20)
BUN: 13 mg/dL (ref 6–24)
Bilirubin Total: 0.3 mg/dL (ref 0.0–1.2)
CALCIUM: 9.7 mg/dL (ref 8.7–10.2)
CO2: 25 mmol/L (ref 20–29)
CREATININE: 1.22 mg/dL (ref 0.76–1.27)
Chloride: 90 mmol/L — ABNORMAL LOW (ref 96–106)
GFR calc Af Amer: 76 mL/min/{1.73_m2} (ref >59)
GFR, EST NON AFRICAN AMERICAN: 65 mL/min/{1.73_m2} (ref >59)
Globulin, Total: 3.3 g/dL (ref 1.5–4.5)
Glucose: 151 mg/dL — ABNORMAL HIGH (ref 65–99)
Potassium: 4.4 mmol/L (ref 3.5–5.2)
Sodium: 137 mmol/L (ref 134–144)
Total Protein: 7.2 g/dL (ref 6.0–8.5)

## 2018-06-06 LAB — TSH: TSH: 1.97 u[IU]/mL (ref 0.450–4.500)

## 2018-06-06 LAB — LIPID PANEL
CHOL/HDL RATIO: 4.4 ratio (ref 0.0–5.0)
Cholesterol, Total: 163 mg/dL (ref 100–199)
HDL: 37 mg/dL — ABNORMAL LOW (ref >39)
LDL CALC: 77 mg/dL (ref 0–99)
TRIGLYCERIDES: 246 mg/dL — AB (ref 0–149)
VLDL Cholesterol Cal: 49 mg/dL — ABNORMAL HIGH (ref 5–40)

## 2018-06-06 LAB — PSA: Prostate Specific Ag, Serum: 0.1 ng/mL (ref 0.0–4.0)

## 2018-06-10 LAB — CBC WITH DIFFERENTIAL/PLATELET
Hematocrit: 44.7 % (ref 37.5–51.0)
Hemoglobin: 13.8 g/dL (ref 13.0–17.7)
MCH: 26.2 pg — ABNORMAL LOW (ref 26.6–33.0)
MCHC: 30.9 g/dL — ABNORMAL LOW (ref 31.5–35.7)
MCV: 85 fL (ref 79–97)
Platelets: 420 10*3/uL (ref 150–450)
RBC: 5.26 x10E6/uL (ref 4.14–5.80)
RDW: 15.5 % — ABNORMAL HIGH (ref 11.6–15.4)
WBC: 16.5 10*3/uL — ABNORMAL HIGH (ref 3.4–10.8)

## 2018-06-11 ENCOUNTER — Other Ambulatory Visit: Payer: Self-pay | Admitting: Internal Medicine

## 2018-06-11 ENCOUNTER — Telehealth: Payer: Self-pay

## 2018-06-11 DIAGNOSIS — D72829 Elevated white blood cell count, unspecified: Secondary | ICD-10-CM

## 2018-06-11 DIAGNOSIS — D649 Anemia, unspecified: Secondary | ICD-10-CM

## 2018-06-11 NOTE — Telephone Encounter (Signed)
Contacted pt to go over lab results pt didn't answer left a vm asking pt to give me a call back at his earliest convenience

## 2018-06-13 LAB — SPECIMEN STATUS REPORT

## 2018-06-15 LAB — SPECIMEN STATUS REPORT

## 2018-06-20 ENCOUNTER — Ambulatory Visit (HOSPITAL_COMMUNITY)
Admission: RE | Admit: 2018-06-20 | Discharge: 2018-06-20 | Disposition: A | Discharge status: Home / Self Care | Payer: Self-pay | Source: Ambulatory Visit | Attending: Internal Medicine | Admitting: Internal Medicine

## 2018-06-20 DIAGNOSIS — M545 Low back pain: Secondary | ICD-10-CM | POA: Insufficient documentation

## 2018-06-20 DIAGNOSIS — G8929 Other chronic pain: Secondary | ICD-10-CM | POA: Insufficient documentation

## 2018-06-23 ENCOUNTER — Telehealth: Payer: Self-pay

## 2018-06-23 NOTE — Telephone Encounter (Signed)
Contacted pt to go over lab and xray results pt is aware and doesn't have any questions or concerns

## 2018-07-07 ENCOUNTER — Ambulatory Visit: Payer: Self-pay | Attending: Internal Medicine

## 2018-07-07 ENCOUNTER — Other Ambulatory Visit: Payer: Self-pay

## 2018-07-09 ENCOUNTER — Other Ambulatory Visit: Payer: Self-pay

## 2018-07-09 ENCOUNTER — Ambulatory Visit: Payer: Self-pay

## 2018-07-10 ENCOUNTER — Ambulatory Visit: Payer: Self-pay

## 2018-07-10 ENCOUNTER — Ambulatory Visit: Payer: Self-pay | Attending: Family Medicine

## 2018-07-10 ENCOUNTER — Other Ambulatory Visit: Payer: Self-pay

## 2018-07-10 DIAGNOSIS — D72829 Elevated white blood cell count, unspecified: Secondary | ICD-10-CM

## 2018-07-11 LAB — CBC WITH DIFFERENTIAL/PLATELET
Basophils Absolute: 0 10*3/uL (ref 0.0–0.2)
Basos: 0 %
EOS (ABSOLUTE): 0 10*3/uL (ref 0.0–0.4)
Eos: 0 %
Hematocrit: 36.7 % — ABNORMAL LOW (ref 37.5–51.0)
Hemoglobin: 12.6 g/dL — ABNORMAL LOW (ref 13.0–17.7)
IMMATURE GRANS (ABS): 0.1 10*3/uL (ref 0.0–0.1)
Immature Granulocytes: 1 %
LYMPHS: 15 %
Lymphocytes Absolute: 1.7 10*3/uL (ref 0.7–3.1)
MCH: 27 pg (ref 26.6–33.0)
MCHC: 34.3 g/dL (ref 31.5–35.7)
MCV: 79 fL (ref 79–97)
Monocytes Absolute: 0.6 10*3/uL (ref 0.1–0.9)
Monocytes: 6 %
Neutrophils Absolute: 9 10*3/uL — ABNORMAL HIGH (ref 1.4–7.0)
Neutrophils: 78 %
Platelets: 301 10*3/uL (ref 150–450)
RBC: 4.66 x10E6/uL (ref 4.14–5.80)
RDW: 14.9 % (ref 11.6–15.4)
WBC: 11.5 10*3/uL — ABNORMAL HIGH (ref 3.4–10.8)

## 2018-07-11 NOTE — Addendum Note (Signed)
Addended by: Karle Plumber B on: 07/11/2018 08:01 AM   Modules accepted: Orders

## 2018-07-14 ENCOUNTER — Telehealth: Payer: Self-pay

## 2018-07-14 NOTE — Telephone Encounter (Signed)
Contacted pt to go over lab results pt is aware and doesn't have any questions or concerns 

## 2018-08-19 ENCOUNTER — Ambulatory Visit: Payer: Self-pay | Admitting: Internal Medicine

## 2018-08-19 ENCOUNTER — Other Ambulatory Visit: Payer: Self-pay

## 2018-08-19 ENCOUNTER — Ambulatory Visit: Payer: Self-pay | Attending: Internal Medicine | Admitting: Internal Medicine

## 2018-08-19 DIAGNOSIS — G8929 Other chronic pain: Secondary | ICD-10-CM

## 2018-08-19 DIAGNOSIS — G4733 Obstructive sleep apnea (adult) (pediatric): Secondary | ICD-10-CM

## 2018-08-19 DIAGNOSIS — R5381 Other malaise: Secondary | ICD-10-CM | POA: Insufficient documentation

## 2018-08-19 DIAGNOSIS — Z8659 Personal history of other mental and behavioral disorders: Secondary | ICD-10-CM

## 2018-08-19 DIAGNOSIS — I509 Heart failure, unspecified: Secondary | ICD-10-CM

## 2018-08-19 DIAGNOSIS — G251 Drug-induced tremor: Secondary | ICD-10-CM | POA: Insufficient documentation

## 2018-08-19 DIAGNOSIS — Z87898 Personal history of other specified conditions: Secondary | ICD-10-CM

## 2018-08-19 DIAGNOSIS — I110 Hypertensive heart disease with heart failure: Secondary | ICD-10-CM

## 2018-08-19 DIAGNOSIS — R7303 Prediabetes: Secondary | ICD-10-CM | POA: Insufficient documentation

## 2018-08-19 DIAGNOSIS — M545 Low back pain: Secondary | ICD-10-CM

## 2018-08-19 MED ORDER — METFORMIN HCL 500 MG PO TABS: 500.0000 mg | ORAL_TABLET | Freq: Every day | ORAL | 3 refills | Status: DC

## 2018-08-19 MED ORDER — MELOXICAM 15 MG PO TABS: 15.0000 mg | ORAL_TABLET | Freq: Every day | ORAL | 3 refills | Status: DC

## 2018-08-19 MED ORDER — ALBUTEROL SULFATE HFA 108 (90 BASE) MCG/ACT IN AERS: 1.0000 | INHALATION_SPRAY | Freq: Four times a day (QID) | RESPIRATORY_TRACT | 0 refills | Status: AC | PRN

## 2018-08-19 NOTE — Progress Notes (Signed)
Virtual Visit via Telephone Note Due to current restrictions/limitations of in-office visits due to the COVID-19 pandemic, this scheduled clinical appointment was converted to a telehealth visit  I connected with Christian Guerra on 08/19/18 at 10:08 a.m by telephone from my office and verified that I am speaking with the correct person using two identifiers.  Pt is at home.  Only patient and myself participated in this encounter.   I discussed the limitations, risks, security and privacy concerns of performing an evaluation and management service by telephone and the availability of in person appointments. I also discussed with the patient that there may be a patient responsible charge related to this service. The patient expressed understanding and agreed to proceed.   History of Present Illness: Patient with history of HTN, OSA (noncompliant with CPAP), diastolic CHF, obesity, pre-DM, hep C(treated 2001), anxiety/bipolar (followed at [facility]), cystic acne.  Last seen 06/05/2018   Chronic LBP:  We went over the results of x-rays of the lumbar spine.  This revealed DJD changes of facet jts. -"it hurts really bad to walk."  No recent falls -pt has applied for [facility] and [facility] discount and was approved.  We had planned to refer him for P.T and perhaps to PMR to see if inj will help.  He sees a physician Dr. York Ram for pain management who has him on Flexeril, gabapentin and oxycodone.  Even with this patient reports pain and limited mobility.  Recent PSA was normal. -would like rxn for Naprosyn to help dec inflammation in his back  Pre-DM:  Dx on last set of blood test.   Eating habits:  Drinks water,OJ and sweet tea.  Usually has eggs and bacon with OJ for BF, for lunch he has a sandwich, potatoe chips and sweet tea; dinner is largest meal of the day with 2 starches on plate and a veggie Exercise: not very active due to chronic back pain and decondition  HTN/CHF:  No CP.  Some ankle edema.  SOB  only when he tries to walk from his house to his car.  SOB same for over 1 yr. No SOB when laying down Sleeps in a recliner He limits salt in foods   OSA:  He has set up his CPAP machine but has not used it as yet.  Plans to start using today.   Anx/Bipolar: He has not seen his psychiatrist at [facility] in a few months.  He states that he tried calling but they are close due to [place].  Out of all meds x 3 wks and states that he does not plan to get back on any of them.  Feeling better since being off meds.  Hand shaking has stopped; thinks it was caused by [place].  States that he was placed on Requip by Dr. Jimmye Norman to help decrease the shaking in his hands that was thought to be due to drug-induced Parkinson-like symptoms.  However he stopped the Requip after using it for short time because of bad side effects.  Observations/Objective: No direct observation done  Assessment and Plan: 1. Chronic midline low back pain without sciatica  Followed by a physician here in [facility] Dr. York Ram who he refers to as his pain specialist.  To try help decrease his chronic pain and increased mobility, will refer for some physical therapy.  Added meloxicam instead of Naprosyn - Ambulatory referral to Physical Therapy - meloxicam (MOBIC) 15 MG tablet; Take 1 tablet (15 mg total) by mouth daily.  Dispense: 30  tablet; Refill: 3  2. Morbid obesity ([place]) 3. Prediabetes Discussed diagnosis of prediabetes. Dietary counseling given.  Patient advised to eliminate all sugary drinks including sweet tea, juices and sodas from his diet.  Recommend that he change from pork to [place] bacon.  Recommend decreasing portion sizes especially of white carbohydrates.  Recommend increasing fresh fruits and vegetables in his diet Will start him on a low dose of metformin - Amb ref to Medical Nutrition Therapy-MNT - metFORMIN (GLUCOPHAGE) 500 MG tablet; Take 1 tablet (500 mg total) by mouth daily with breakfast.  Dispense:  90 tablet; Refill: 3  4. Physical deconditioning - Ambulatory referral to Physical Therapy  5. OSA (obstructive sleep apnea) Strongly encouraged him to use his CPAP machine.  He plans to start using it tonight  6. History of bipolar disorder Recommend following up with his psychiatrist at [facility] once they reopens.  For now he declines any refills on any of his medications.  Informed that some of the psych medications can cause movement disorders sometimes which can not be reversed once it develops.  He reports that the tremors in his hands have resolved with him stopping Latuda  7. Drug-induced tremor Resolved.  See #6 above  8. History of wheezing Patient requested refill on albuterol inhaler - albuterol (VENTOLIN HFA) 108 (90 Base) MCG/ACT inhaler; Inhale 1 puff into the lungs every 6 (six) hours as needed for wheezing or shortness of breath.  Dispense: 1 Inhaler; Refill: 0   Follow Up Instructions: F/u in 3 mths   I discussed the assessment and treatment plan with the patient. The patient was provided an opportunity to ask questions and all were answered. The patient agreed with the plan and demonstrated an understanding of the instructions.   The patient was advised to call back or seek an in-person evaluation if the symptoms worsen or if the condition fails to improve as anticipated.  I provided 27 minutes of non-face-to-face time during this encounter.   Karle Plumber, MD

## 2018-08-19 NOTE — Progress Notes (Signed)
Pt states he pulled a muscle in his right shoulder. Pt states he took his pain medicine for the pain   Pt is requesting a rx for naproxen

## 2018-08-21 ENCOUNTER — Telehealth: Payer: Self-pay | Admitting: Physical Therapy

## 2018-08-21 NOTE — Telephone Encounter (Signed)
Spoke with patient regarding temporary clinic hour reduction. The patient will be contacted when the clinic re-opens. The patient is aware and comfortable with waiting until the clinic opens back up.

## 2018-09-08 ENCOUNTER — Ambulatory Visit: Payer: Self-pay | Admitting: Registered"

## 2018-09-17 ENCOUNTER — Encounter: Payer: Self-pay | Attending: Internal Medicine | Admitting: Registered"

## 2018-09-17 ENCOUNTER — Encounter: Payer: Self-pay | Admitting: Registered"

## 2018-09-17 DIAGNOSIS — R7303 Prediabetes: Secondary | ICD-10-CM | POA: Insufficient documentation

## 2018-09-17 NOTE — Patient Instructions (Addendum)
Instructions/Goals:  Make sure to get in three meals per day. Try to have balanced meals like the My Plate example (see handout). Include lean proteins, vegetables, fruits, and whole grains at meals.   Include 3 meals per day/eat every 3-5 hours while awake.   Try having a whole piece of fruit in place of juice at breakfast. Try having low fat milk or water as beverage.   Limit foods high in sodium (those with 20% DV for sodium per serving or more) and choose more low sodium foods (those with 5% or less DV for sodium per serving).  Recommend calling your doctor today to talk with them about severe constipation and let them know which medications you have stopped taking. Also recommend following up with your doctor regarding iron level.

## 2018-09-17 NOTE — Progress Notes (Addendum)
Medical Nutrition Therapy:  Appt start time: 0802 end time:  0900.   Assessment:  Primary concerns today: Pt referred due to prediabetes.   This visit was completed via telephone due to the COVID-19 pandemic. I spoke with Christian Guerra and verified that I was speaking with the correct person with two patient identifiers (full name and date of birth).    Pt reports that he has been having severe constipation the last few days. Reports he was able to go some today, but has still not felt like eating today or yesterday. Reports that he stopped taking some of his medication because he thought it may be making it worse. Pt reports he has also stopped taking his medication for depression d/t side effects. Reports he has been having more trouble with depression over the past few days when his stomach was not feeling well. Denies any SI. Reports he is waiting to get another counseling appointment with his therapist.   Pt wants to know if prediabetes can be revered through dietary changes. Pt reports he has heard some about diabetes/insulin resistance from a friend in the past who has diabetes but no education regarding diabetes/prediabetes otherwise.   Pt reports he was told his iron was low at MD visit but is not taking an iron supplement and wanted to know if he should start one.   Pt reports that his brother cooks most of his meals and pt has been trying to change up his breakfast.   Food Allergies/Intolerances: None reported.   GI Concerns: Has been having constipation and reported not feeling like eating anything for a couple days.   Pertinent Lab Values:   06/05/18:  HgbA1c: 6.2 Triglycerides: 246 VLDL Cholesterol: 49 HDL Cholesterol: 37  Preferred Learning Style:   No preference indicated   Learning Readiness:   Ready  MEDICATIONS: None reported.   DIETARY INTAKE:  Usual eating pattern includes 2 meals and 1 snack per day. Eats breakfast and dinner. No lunch. May have a snack at  lunch time.   Common foods: eggs and bacon.  Avoided foods: beets, sauerkraut, some types of fish.     Typical Snacks: chicken salad on wheat bread.     Typical Beverages: water; OJ; occasionally milk; sweet tea sometimes but has been cutting down on sweet tea.   Location of Meals: kitchen table.  Electronics Present at [employer]: Yes: TV in background.   24-hr recall: Typical Day (pt reports he did not eat anything day before)  B ( AM): typically 3 fried eggs, 2 thin pieces of bacon, 12 oz OJ Snk ( AM): None reported.  L ( PM): Skips lunch about half the time. Sometimes has a sandwich and water.  Snk ( PM): None reported.  D (9 PM): green beans, squash, mashed potatoes, a protein, water Snk ( PM): None reported.  Beverages: water; OJ; sometimes sweet tea   Usual physical activity: None reported d/t back pain.   Minutes/Week: N/A  Progress Towards Goal(s):  In progress.   Nutritional Diagnosis:  NI-5.11.1 Predicted suboptimal nutrient intake As related to skipping lunch.  As evidenced by pt's reported dietary recall and habits .    Intervention:  Nutrition counseling provided. Dietitian discussed pt's lab values. Advised pt to contact MD regarding iron level as it is noted that further lab work was planned. Advised pt to contact MD regarding symptoms of severe constipation/bloating as well as to let them know about medications he has stopped taking. Provided education regarding insulin resistance/prediabetes  and relationship with dietary intake. Provided education on balanced nutrition and importance of consistent meals. Provided education on mindful eating. Provided education on heart healthy nutrition and selecting low sodium foods using the nutrition label. Worked with pt to set goals. Pt was agreeable to information/goals discussed.   Instructions/Goals:  Make sure to get in three meals per day. Try to have balanced meals like the My Plate example (see handout). Include lean  proteins, vegetables, fruits, and whole grains at meals.   Include 3 meals per day/eat every 3-5 hours while awake.   Try having a whole piece of fruit in place of juice at breakfast. Try having low fat milk or water as beverage.   Limit foods high in sodium (those with 20% DV for sodium per serving or more) and choose more low sodium foods (those with 5% or less DV for sodium per serving).  Recommend calling your doctor today to talk with them about severe constipation and let them know which medications you have stopped taking. Also recommend following up with your doctor regarding iron level.   Teaching Method Utilized:  Visual Auditory  Handouts given during visit include:  Balanced plate and food list  Barriers to learning/adherence to lifestyle change: None indicated.   Demonstrated degree of understanding via:  Teach Back   Monitoring/Evaluation:  Dietary intake, exercise, and body weight in 1 month(s).

## 2018-10-06 ENCOUNTER — Ambulatory Visit: Payer: Self-pay | Attending: Internal Medicine | Admitting: Internal Medicine

## 2018-10-06 ENCOUNTER — Other Ambulatory Visit: Payer: Self-pay

## 2018-10-09 ENCOUNTER — Other Ambulatory Visit: Payer: Self-pay

## 2018-10-09 ENCOUNTER — Ambulatory Visit: Payer: Self-pay | Attending: Internal Medicine | Admitting: Internal Medicine

## 2018-10-09 DIAGNOSIS — K5903 Drug induced constipation: Secondary | ICD-10-CM

## 2018-10-09 DIAGNOSIS — Z86010 Personal history of colonic polyps: Secondary | ICD-10-CM

## 2018-10-09 DIAGNOSIS — R350 Frequency of micturition: Secondary | ICD-10-CM

## 2018-10-09 DIAGNOSIS — R7303 Prediabetes: Secondary | ICD-10-CM

## 2018-10-09 DIAGNOSIS — R1013 Epigastric pain: Secondary | ICD-10-CM

## 2018-10-09 DIAGNOSIS — R358 Other polyuria: Secondary | ICD-10-CM

## 2018-10-09 MED ORDER — TAMSULOSIN HCL 0.4 MG PO CAPS: 0.4000 mg | ORAL_CAPSULE | Freq: Every day | ORAL | 3 refills | Status: DC

## 2018-10-09 MED ORDER — POLYETHYLENE GLYCOL 3350 17 G PO PACK: 17.0000 g | PACK | Freq: Every day | ORAL | 3 refills | Status: DC | PRN

## 2018-10-09 NOTE — Progress Notes (Signed)
Pt states he just took some gas x   Pt states he fill like he has gas build up

## 2018-10-09 NOTE — Progress Notes (Signed)
Virtual Visit via Telephone Note Due to current restrictions/limitations of in-office visits due to the COVID-19 pandemic, this scheduled clinical appointment was converted to a telehealth visit  I connected with Christian Guerra on 10/09/18 at 1:40 pm EDT by telephone and verified that I am speaking with the correct person using two identifiers. I am in my office.  The patient is at home.  Only the patient and myself participated in this encounter.  I discussed the limitations, risks, security and privacy concerns of performing an evaluation and management service by telephone and the availability of in person appointments. I also discussed with the patient that there may be a patient responsible charge related to this service. The patient expressed understanding and agreed to proceed.  History of Present Illness: Patient with history of HTN, OSA(noncompliant with CPAP), diastolic CHF, obesity, pre-DM, hep C(treated 2001),anxiety/bipolar (followed at [facility]), cystic acne.  Last evaluated 07/2018.   Purpose of today's visit is urgent care for gastrointestinal symptoms.  On 09/13/2018 he began feeling weak, loss appetite and had problems moving bowel.  Associated with abdominal cramps, nausea and bloating.  No fever -started drinking prune juice which helped some.  Saw his pain specialist about 1 wk ago.  He recommended Ducolax and Mg Citrate.  He did and had diarrhea for 2-3 days after.  Last took meds 5 days ago.  Last BM was this a.m but only a little came out.  No blood in his stools.  Also taking Gas-X which helps the bloating. -Cramps have resolved and nausea is better.  No stomach pains with meals. He avoids green leafy veggies for now as they increase bloating.  Eating smaller portions -loss 23 lbs since 09/13/2018. -had c-scope 6-7 yrs ago but does not recall where.  Had polyps removed and told he needed repeat in 5 yrs  Also c/o polyuria day and night and "not a whole lot comes out."  Had  nocturia for past 1 yr where he gets up 4-6 x at nights.  Now he is going a lot during the day and night.  Endorses urgency.  No dysuria.  Some hesitancy.  No straining.  Stream flows freely -he was drinking over a gallon of water a day out of habit. Now he only drinks about 1/2 gallon a day just so that he does not urinate so much.  He denies drinking caffeinated beverages during the day or at nights. -He is on metformin for prediabetes.  Outpatient Encounter Medications as of 10/09/2018  Medication Sig  . acyclovir (ZOVIRAX) 400 MG tablet Take 1 tablet (400 mg total) by mouth 3 (three) times daily. For 5 days. (Patient not taking: Reported on 06/05/2018)  . albuterol (VENTOLIN HFA) 108 (90 Base) MCG/ACT inhaler Inhale 1 puff into the lungs every 6 (six) hours as needed for wheezing or shortness of breath.  Marland Kitchen atenolol (TENORMIN) 50 MG tablet Take 1 tablet (50 mg total) by mouth daily.  . cyclobenzaprine (FLEXERIL) 10 MG tablet Take 2 tablets (20 mg total) by mouth 3 (three) times daily as needed for muscle spasms.  Marland Kitchen gabapentin (NEURONTIN) 800 MG tablet TAKE 2 TABLETS BY MOUTH IN THE MORNING, 1 TABLET BY MOUTH IN THE AFTERNOON, AND 1 TABLET BY MOUTH AT NIGHT.  Marland Kitchen meloxicam (MOBIC) 15 MG tablet Take 1 tablet (15 mg total) by mouth daily.  . metFORMIN (GLUCOPHAGE) 500 MG tablet Take 1 tablet (500 mg total) by mouth daily with breakfast.  . minocycline (DYNACIN) 100 MG tablet Take 100 mg  by mouth 3 (three) times daily.   Marland Kitchen omeprazole (PRILOSEC) 20 MG capsule Take 1 capsule (20 mg total) by mouth daily.  Marland Kitchen oxyCODONE (ROXICODONE) 15 MG immediate release tablet Take 15 mg by mouth every 8 (eight) hours as needed for pain.  . traZODone (DESYREL) 100 MG tablet Take 1 tablet (100 mg total) by mouth at bedtime as needed for sleep. (Patient not taking: Reported on 06/05/2018)  . triamcinolone cream (KENALOG) 0.1 % Apply 1 application topically as needed (Rash).   No facility-administered encounter medications  on file as of 10/09/2018.     Observations/Objective: No direct observation done as this was a telephone encounter.  Assessment and Plan: 1. Drug-induced constipation Patient on narcotic medication which can cause constipation.  I recommend taking MiraLAX at least 3 days a week to help keep bowel movements regular. - polyethylene glycol (MIRALAX / GLYCOLAX) 17 g packet; Take 17 g by mouth daily as needed.  Dispense: 30 each; Refill: 3 - DG Abd 2 Views; Future  2. Epigastric pain - Lipase; Future - Comprehensive metabolic panel; Future - DG Abd 2 Views; Future - Ambulatory referral to Gastroenterology  3. Frequency of urination and polyuria Symptoms suggest BPH.  We will give a trial of Flomax.  Advised to avoid drinking caffeinated beverages especially in the evenings.  We will recheck A1c to make sure symptoms are not due to diabetes - tamsulosin (FLOMAX) 0.4 MG CAPS capsule; Take 1 capsule (0.4 mg total) by mouth daily.  Dispense: 30 capsule; Refill: 3 - Hemoglobin A1c; Future - Urinalysis; Future  4. Prediabetes - Hemoglobin A1c; Future  5. History of colon polyps - Ambulatory referral to Gastroenterology  Follow Up Instructions: Keep follow-up appointment that is already scheduled for next month.  Follow-up sooner if any worsening   I discussed the assessment and treatment plan with the patient. The patient was provided an opportunity to ask questions and all were answered. The patient agreed with the plan and demonstrated an understanding of the instructions.   The patient was advised to call back or seek an in-person evaluation if the symptoms worsen or if the condition fails to improve as anticipated.  I provided 22 minutes of non-face-to-face time during this encounter.   Karle Plumber, MD

## 2018-10-14 ENCOUNTER — Ambulatory Visit (HOSPITAL_COMMUNITY)
Admission: RE | Admit: 2018-10-14 | Discharge: 2018-10-14 | Disposition: A | Discharge status: Home / Self Care | Payer: Self-pay | Source: Ambulatory Visit | Attending: Internal Medicine | Admitting: Internal Medicine

## 2018-10-14 ENCOUNTER — Ambulatory Visit: Payer: Self-pay | Attending: Internal Medicine

## 2018-10-14 ENCOUNTER — Other Ambulatory Visit: Payer: Self-pay

## 2018-10-14 DIAGNOSIS — R1013 Epigastric pain: Secondary | ICD-10-CM | POA: Insufficient documentation

## 2018-10-14 DIAGNOSIS — K5903 Drug induced constipation: Secondary | ICD-10-CM | POA: Insufficient documentation

## 2018-10-14 DIAGNOSIS — R350 Frequency of micturition: Secondary | ICD-10-CM

## 2018-10-14 DIAGNOSIS — R7303 Prediabetes: Secondary | ICD-10-CM

## 2018-10-15 LAB — URINALYSIS
Bilirubin, UA: NEGATIVE
Glucose, UA: NEGATIVE
Ketones, UA: NEGATIVE
Nitrite, UA: NEGATIVE
RBC, UA: NEGATIVE
Specific Gravity, UA: 1.018 (ref 1.005–1.030)
Urobilinogen, Ur: 1 mg/dL (ref 0.2–1.0)
pH, UA: 7.5 (ref 5.0–7.5)

## 2018-10-15 LAB — COMPREHENSIVE METABOLIC PANEL
ALT: 39 IU/L (ref 0–44)
AST: 32 IU/L (ref 0–40)
Albumin/Globulin Ratio: 1 — ABNORMAL LOW (ref 1.2–2.2)
Albumin: 3.3 g/dL — ABNORMAL LOW (ref 3.8–4.9)
Alkaline Phosphatase: 176 IU/L — ABNORMAL HIGH (ref 39–117)
BUN/Creatinine Ratio: 12 (ref 9–20)
BUN: 11 mg/dL (ref 6–24)
Bilirubin Total: 0.8 mg/dL (ref 0.0–1.2)
CO2: 25 mmol/L (ref 20–29)
Calcium: 8.7 mg/dL (ref 8.7–10.2)
Chloride: 89 mmol/L — ABNORMAL LOW (ref 96–106)
Creatinine, Ser: 0.95 mg/dL (ref 0.76–1.27)
GFR calc Af Amer: 102 mL/min/{1.73_m2} (ref >59)
GFR calc non Af Amer: 88 mL/min/{1.73_m2} (ref >59)
Globulin, Total: 3.3 g/dL (ref 1.5–4.5)
Glucose: 99 mg/dL (ref 65–99)
Potassium: 3.4 mmol/L — ABNORMAL LOW (ref 3.5–5.2)
Sodium: 131 mmol/L — ABNORMAL LOW (ref 134–144)
Total Protein: 6.6 g/dL (ref 6.0–8.5)

## 2018-10-15 LAB — LIPASE: Lipase: 21 U/L (ref 13–78)

## 2018-10-15 LAB — HEMOGLOBIN A1C
Est. average glucose Bld gHb Est-mCnc: 120 mg/dL
Hgb A1c MFr Bld: 5.8 % — ABNORMAL HIGH (ref 4.8–5.6)

## 2018-10-16 ENCOUNTER — Telehealth: Payer: Self-pay | Admitting: *Deleted

## 2018-10-16 ENCOUNTER — Other Ambulatory Visit: Payer: Self-pay | Admitting: Internal Medicine

## 2018-10-16 DIAGNOSIS — R748 Abnormal levels of other serum enzymes: Secondary | ICD-10-CM

## 2018-10-16 MED ORDER — SENNOSIDES-DOCUSATE SODIUM 8.6-50 MG PO TABS: 1.0000 | ORAL_TABLET | Freq: Every day | ORAL | 2 refills | Status: DC

## 2018-10-16 MED ORDER — GOLYTELY 236 G PO SOLR: ORAL | 0 refills | Status: DC

## 2018-10-16 NOTE — Telephone Encounter (Signed)
Patient verified DOB Patient is aware of moderate stool being noted in his xray of the abdomen. Patient states the miralax has not been helping. Patient began taking milk of magnesia on yesterday and has been having diarrhea. Patient states he feels the gas trapped and has been eating lighter and taking longer to finish his meals so he does not feel so full that he is unable to breath. MA encouraged patient to continue with milk of mag as needed and to await PCP's response on Monday regarding a plan.

## 2018-10-16 NOTE — Telephone Encounter (Signed)
-----   Message from Ladell Pier, MD sent at 10/15/2018  7:58 AM EDT ----- Let patient know that the x-ray of the abdomen revealed moderate stool seen in the colon.  Please let me know if the MiraLAX is helping him move his bowels more regularly.

## 2018-10-17 ENCOUNTER — Telehealth: Payer: Self-pay

## 2018-10-17 NOTE — Telephone Encounter (Signed)
-----   Message from Trecia Rogers, [place] sent at 10/17/2018  1:01 PM EDT ----- Please share results and advice

## 2018-10-17 NOTE — Telephone Encounter (Signed)
Patient name and DOB has been verified Patient was informed of lab results. Patient had no questions.  

## 2018-10-17 NOTE — Progress Notes (Signed)
Please share results and advice

## 2018-10-22 ENCOUNTER — Other Ambulatory Visit: Payer: Self-pay

## 2018-10-22 ENCOUNTER — Ambulatory Visit: Payer: Self-pay | Attending: Family Medicine

## 2018-10-22 DIAGNOSIS — D649 Anemia, unspecified: Secondary | ICD-10-CM

## 2018-10-22 DIAGNOSIS — R748 Abnormal levels of other serum enzymes: Secondary | ICD-10-CM

## 2018-10-23 ENCOUNTER — Telehealth: Payer: Self-pay | Admitting: Registered"

## 2018-10-23 ENCOUNTER — Encounter: Payer: Self-pay | Admitting: Registered"

## 2018-10-23 ENCOUNTER — Encounter: Payer: Self-pay | Attending: Internal Medicine | Admitting: Registered"

## 2018-10-23 ENCOUNTER — Ambulatory Visit (INDEPENDENT_AMBULATORY_CARE_PROVIDER_SITE_OTHER): Payer: Self-pay | Admitting: Internal Medicine

## 2018-10-23 ENCOUNTER — Encounter: Payer: Self-pay | Admitting: Internal Medicine

## 2018-10-23 ENCOUNTER — Ambulatory Visit (HOSPITAL_COMMUNITY)
Admission: RE | Admit: 2018-10-23 | Discharge: 2018-10-23 | Disposition: A | Discharge status: Home / Self Care | Payer: Self-pay | Source: Ambulatory Visit | Attending: Internal Medicine | Admitting: Internal Medicine

## 2018-10-23 VITALS — Ht 66.0 in | Wt 352.0 lb

## 2018-10-23 DIAGNOSIS — R7303 Prediabetes: Secondary | ICD-10-CM | POA: Insufficient documentation

## 2018-10-23 DIAGNOSIS — K5909 Other constipation: Secondary | ICD-10-CM | POA: Insufficient documentation

## 2018-10-23 DIAGNOSIS — R634 Abnormal weight loss: Secondary | ICD-10-CM | POA: Insufficient documentation

## 2018-10-23 DIAGNOSIS — R1010 Upper abdominal pain, unspecified: Secondary | ICD-10-CM | POA: Insufficient documentation

## 2018-10-23 MED ORDER — IOHEXOL 300 MG/ML  SOLN
100.0000 mL | Freq: Once | INTRAMUSCULAR | Status: AC | PRN
  Administered 2018-10-23: 100 mL via INTRAVENOUS

## 2018-10-23 NOTE — Progress Notes (Signed)
TELEHEALTH ENCOUNTER IN SETTING OF COVID-19 PANDEMIC - REQUESTED BY PATIENT SERVICE PROVIDED BY TELEMEDECINE - TYPE: Telephone PATIENT LOCATION: Home PATIENT HAS CONSENTED TO TELEHEALTH VISIT PROVIDER LOCATION: OFFICE REFERRING PROVIDER:Johnson, Dalbert Batman, MD PARTICIPANTS OTHER THAN PATIENT: None TIME SPENT ON CALL: 12 minutes   Christian Guerra 58 y.o. Aug 29, 1960 [id number]  Assessment & Plan:   Encounter Diagnoses  Name Primary?  . Loss of weight Yes  . Upper abdominal pain   . Other constipation     Certainly seems like something bad could be going on, particularly a gastrointestinal tract malignancy.  He had a colonoscopy a little over 5 years ago and he could have developed cancer in the interim, so colonoscopy is something we need to consider but I am going to start with a CT of the abdomen and pelvis given the overall situation.  I am not certain how a prep would go without an in person exam and a rectal to exclude impaction.  We will try to get the CT scan today.  Further plans pending that.  I appreciate the opportunity to care for this patient. CC: Christian Pier, MD   CT scan showed a possible kidney lesion that we will work-up with an MRI but nothing that would explain his drastic weight loss and symptoms.  I am going to have him come in for an in office visit for physical exam and I will arrange endoscopic evaluation at that time.  He will continue MiraLAX in the meantime.  Subjective:   Chief Complaint: Weight loss abdominal pain and constipation  HPI This 58 year old white man has had problems with constipation that started on May 23 he says.  At that point he developed difficulty with defecation and was struggling to go even using laxatives.  He has been losing weight approximately 40 pounds since then.  He saw Dr. Wynetta Emery of primary care And a 2 view abdominal film showed no obstruction with moderate amount of stool in the colon.  His alkaline phosphatase was  mildly elevated though that is not necessarily new, his albumin was down at 3.3, and he had some mild electrolyte derangements that she thought was probably from his laxative use.  Iron studies were done and a ferritin was 575.  Colonoscopy in 2014 with a diminutive adenoma and 5 inflammatory polyps, recall letter has been sent but he has not had his repeat colonoscopy yet.  He is afraid to eat because he feels so bad with upper abdominal pain that is constant but intensifies with eating.  He says he has an appetite but is afraid to eat.  He has been on chronic narcotics and says medications have not changed in a long time.  Treatments tried for the constipation included prune juice which helped a little bit, then Dulcolax and magnesium citrate, he had diarrhea 2 to 3 days later, but was having very minimal stool output after that.  Gas-X has helped bloating a little bit.  He has not had rectal bleeding.  His hemoglobin was near normal normal in the spring. Wt Readings from Last 3 Encounters:  10/23/18 (!) 352 lb (159.7 kg)  10/23/18 (!) 352 lb (159.7 kg)  06/05/18 (!) 390 lb 3.2 oz (177 kg)   CBC Latest Ref Rng & Units 07/10/2018 06/05/2018 06/05/2018  WBC 3.4 - 10.8 x10E3/uL 11.5(H) 16.5(H) 16.8(H)  Hemoglobin 13.0 - 17.7 g/dL 12.6(L) 13.8 14.3  Hematocrit 37.5 - 51.0 % 36.7(L) 44.7 44.1  Platelets 150 - 450 x10E3/uL 301  420 381   Lab Results  Component Value Date   TSH 1.970 06/05/2018   He does have a history of hepatitis C status post treatment and last year his viral RNA was negative.  Allergies  Allergen Reactions  . Codeine Itching   Current Meds  Medication Sig  . albuterol (VENTOLIN HFA) 108 (90 Base) MCG/ACT inhaler Inhale 1 puff into the lungs every 6 (six) hours as needed for wheezing or shortness of breath.  Marland Kitchen atenolol (TENORMIN) 50 MG tablet Take 1 tablet (50 mg total) by mouth daily.  . minocycline (DYNACIN) 100 MG tablet Take 100 mg by mouth 3 (three) times daily.   Marland Kitchen  omeprazole (PRILOSEC) 20 MG capsule Take 1 capsule (20 mg total) by mouth daily.  Marland Kitchen oxyCODONE (ROXICODONE) 15 MG immediate release tablet Take 15 mg by mouth every 8 (eight) hours as needed for pain.  . polyethylene glycol (GOLYTELY) 236 g solution Drink 2-3 glasses x 1 PO.  . senna-docusate (SENOKOT S) 8.6-50 MG tablet Take 1 tablet by mouth daily.  . tamsulosin (FLOMAX) 0.4 MG CAPS capsule Take 1 capsule (0.4 mg total) by mouth daily.   Past Medical History:  Diagnosis Date  . Anxiety   . Bruised kidney 09/10/2016  . Chronic lower back pain   . Chronic pain   . Daily headache   . Diastolic dysfunction with chronic heart failure ([place])   . GERD (gastroesophageal reflux disease)   . Hepatitis C    "treated; got down to 0 load 6-8 years ago" (09/13/2016)  . Hypertension   . Memory difficulties    "in the last week or so" (09/13/2016)  . Morbid obesity ([place])   . Personal history of colonic adenoma 01/20/2013  . Recurrent falls    "recently" (09/13/2016)  . Sleep apnea    "wore mask at night before; couldn't tolerate it" (09/13/2016)   Past Surgical History:  Procedure Laterality Date  . ANKLE FRACTURE SURGERY Bilateral 2001  . APPENDECTOMY  1975  . FOREARM SURGERY Left 2001   "chain saw injury"  . FRACTURE SURGERY    . 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   Review of Systems See HPI also has polyuria and nocturia.  No dysuria.  He has reduced water intake because he was urinating so much and that has helped.  Chronic back pain issues followed in pain clinic.  Chronic headaches.  The remainder the review of systems appears negative.

## 2018-10-23 NOTE — Patient Instructions (Addendum)
You have been scheduled for a CT scan of the abdomin and pelvis.  You are scheduled today at [facility] should arrive at 1:00pm for your prepping  and  registration. Please follow the written instructions below on the day of your exam:  WARNING: IF YOU ARE ALLERGIC TO IODINE/X-RAY DYE, PLEASE NOTIFY RADIOLOGY IMMEDIATELY AT [phone]! YOU WILL BE GIVEN A 13 HOUR PREMEDICATION PREP.  1) Do not eat or drink anything after 11:30AM (4 hours prior to your test) which will start at 3:30pm.  The purpose of you drinking the oral contrast is to aid in the visualization of your intestinal tract. The contrast solution may cause some diarrhea. Depending on your individual set of symptoms, you may also receive an intravenous injection of x-ray contrast/dye. This test typically takes 30-45 minutes to complete.   ________________________________________________________________________   I appreciate the opportunity to care for you. Gatha Mayer, MD, [facility]

## 2018-10-23 NOTE — Progress Notes (Signed)
The CT scan does not show a cause for his problems  There is a possible abnormality on his kidney that needs an MRI - please order that as the radiologist described  Dx abnormal CT kidney  He needs to take 2 doses of MiraLax daily and he needs an in office appointment so I can examine him and set up colonoscopy and possible EGD

## 2018-10-23 NOTE — Telephone Encounter (Signed)
Called pt for telephone appointment. Pt requested to have appointment rescheduled. Reports he is not feeling well and awaiting call from his doctor this morning regarding next steps. Reports they suspect he has an intestinal blockage. Pt reports he did not receive information in the mail from last visit. Dietitian advised office will call pt next week to reschedule appointment. Encouraged pt to call if any questions for dietitian and advised him dietitian will resend information from previous visit. Pt voiced understanding.

## 2018-10-24 LAB — MITOCHONDRIAL ANTIBODIES: Mitochondrial Ab: <20 Units (ref 0.0–20.0)

## 2018-10-24 LAB — IRON,TIBC AND FERRITIN PANEL
Ferritin: 575 ng/mL — ABNORMAL HIGH (ref 30–400)
Iron Saturation: 29 % (ref 15–55)
Iron: 47 ug/dL (ref 38–169)
Total Iron Binding Capacity: 161 ug/dL — ABNORMAL LOW (ref 250–450)
UIBC: 114 ug/dL (ref 111–343)

## 2018-10-27 ENCOUNTER — Telehealth: Payer: Self-pay | Admitting: Internal Medicine

## 2018-10-27 DIAGNOSIS — R93429 Abnormal radiologic findings on diagnostic imaging of unspecified kidney: Secondary | ICD-10-CM

## 2018-10-27 NOTE — Telephone Encounter (Signed)
Pt stated that he is "really sick" and was expecting a call from you.

## 2018-10-27 NOTE — Telephone Encounter (Signed)
Covid-19 screening questions   Do you now or have you had a fever in the last 14 days? no  Do you have any respiratory symptoms of shortness of breath or cough now or in the last 14 days? no  Do you have any family members or close contacts with diagnosed or suspected Covid-19 in the past 14 days? no  Have you been tested for Covid-19 and found to be positive? no   Patient has office visit set up with Dr Carlean Purl tomorrow as a f/u from his televisit.    I have set him up for a Abdominal MRI, renal protocol at [facility] on 11/02/2018 at 11:00AM, arrive at 10:30AM, NPO 4 hours. Ketan wrote down the appointment date/times. Michela Pitcher he will need a large wheelchair.

## 2018-10-28 ENCOUNTER — Ambulatory Visit (INDEPENDENT_AMBULATORY_CARE_PROVIDER_SITE_OTHER): Payer: Self-pay | Admitting: Internal Medicine

## 2018-10-28 ENCOUNTER — Encounter: Payer: Self-pay | Admitting: Internal Medicine

## 2018-10-28 ENCOUNTER — Other Ambulatory Visit: Payer: Self-pay

## 2018-10-28 VITALS — BP 122/74 | HR 70 | Temp 97.6°F | Ht 67.0 in | Wt 352.0 lb

## 2018-10-28 DIAGNOSIS — R194 Change in bowel habit: Secondary | ICD-10-CM

## 2018-10-28 DIAGNOSIS — R1013 Epigastric pain: Secondary | ICD-10-CM

## 2018-10-28 DIAGNOSIS — N289 Disorder of kidney and ureter, unspecified: Secondary | ICD-10-CM

## 2018-10-28 DIAGNOSIS — R634 Abnormal weight loss: Secondary | ICD-10-CM

## 2018-10-28 MED ORDER — NA SULFATE-K SULFATE-MG SULF 17.5-3.13-1.6 GM/177ML PO SOLN: 1.0000 | Freq: Once | ORAL | 0 refills | Status: AC

## 2018-10-28 NOTE — Patient Instructions (Addendum)
Due to recent COVID-19 restrictions implemented by our local and state authorities and in an effort to keep both patients and staff as safe as possible, our hospital system now requires COVID-19 testing prior to any scheduled hospital procedure. Please go to our [facility] location drive thru testing site (this is directly across from our office but on GPS will read an address of  [place], [place] [address]) on 11/07/2018, any time between 9 am - 3 pm. You will not be billed at the time of testing but may receive a bill later depending on your insurance. The approximate cost of the test is $100. You must agree to quarantine from the time of your testing until the procedure date on 11/11/2018 . This should include staying at home with ONLY the people you live with. Avoid take-out, grocery store shopping or leaving the house for any non-emergent reason. Please call our office at [phone] if you have any questions.   You have been scheduled for a colonoscopy and EGD. Please follow written instructions given to you at your visit today.  Please use the suprep sample we have given you today. If you use inhalers (even only as needed), please bring them with you on the day of your procedure.   I appreciate the opportunity to care for you. Silvano Rusk, MD, [facility]

## 2018-10-28 NOTE — Progress Notes (Signed)
Christian Guerra 58 y.o. [birth year] [id number]  Assessment & Plan:   Encounter Diagnoses  Name Primary?  . Loss of weight Yes  . Abdominal pain, epigastric   . Change in bowel habits   . Kidney lesion, native, left     EGD and colonoscopy at [facility] (BMI>50)  MRI kidneys set up for 7/12  The risks and benefits as well as alternatives of endoscopic procedure(s) have been discussed and reviewed. All questions answered. The patient agrees to proceed.  Continue PEG 3350 (aka MiraLax)  Subjective:   Chief Complaint: weight loss, new constipation, epigastric pain  HPI Christian Guerra is a 58 year old white man seen through telehealth last week with complaints of profound weight loss and new constipation problems.  Severe abdominal pain after eating.  Epigastric pain, food feels like it was sitting there and he could not move his bowels regularly.  Christian Guerra had done abdominal films and labs which were unrevealing.  I ordered a CT of the abdomen and pelvis which did not show any obvious GI malignancy or problems, he did have a 7 mm lesion in the lower pole of the left kidney and an MRI has been scheduled to evaluate that to see if that is a malignancy.  He is improved after the CT scan and using regular MiraLAX moving his bowels better.  He still has this postprandial heavy epigastric pain about an hour after eating and feels like food is sitting in the top of his stomach he says.  He has not been vomiting.  He is eating a little bit better he had a hard boiled egg this morning and tolerated that.   Wt Readings from Last 3 Encounters:  10/28/18 (!) 352 lb (159.7 kg)  10/23/18 (!) 352 lb (159.7 kg)  10/23/18 (!) 352 lb (159.7 kg)   He was 176 kg or 390 pounds in February of this year Allergies  Allergen Reactions  . Codeine Itching   Current Meds  Medication Sig  . albuterol (VENTOLIN HFA) 108 (90 Base) MCG/ACT inhaler Inhale 1 puff into the lungs every 6 (six) hours as needed for wheezing  or shortness of breath.  Marland Kitchen atenolol (TENORMIN) 50 MG tablet Take 1 tablet (50 mg total) by mouth daily.  . minocycline (DYNACIN) 100 MG tablet Take 100 mg by mouth 3 (three) times daily.   Marland Kitchen omeprazole (PRILOSEC) 20 MG capsule Take 1 capsule (20 mg total) by mouth daily.  Marland Kitchen oxyCODONE (ROXICODONE) 15 MG immediate release tablet Take 15 mg by mouth every 8 (eight) hours as needed for pain.  . polyethylene glycol (GOLYTELY) 236 g solution Drink 2-3 glasses x 1 PO.  . senna-docusate (SENOKOT S) 8.6-50 MG tablet Take 1 tablet by mouth daily.  . tamsulosin (FLOMAX) 0.4 MG CAPS capsule Take 1 capsule (0.4 mg total) by mouth daily.   Past Medical History:  Diagnosis Date  . Anxiety   . Bruised kidney 09/10/2016  . Chronic lower back pain   . Chronic pain   . Daily headache   . Diastolic dysfunction with chronic heart failure ([place])   . GERD (gastroesophageal reflux disease)   . Hepatitis C    "treated; got down to 0 load 6-8 years ago" (09/13/2016)  . Hypertension   . Memory difficulties    "in the last week or so" (09/13/2016)  . Morbid obesity ([place])   . Personal history of colonic adenoma 01/20/2013  . Recurrent falls    "recently" (09/13/2016)  . Sleep apnea    "  wore mask at night before; couldn't tolerate it" (09/13/2016)   Past Surgical History:  Procedure Laterality Date  . ANKLE FRACTURE SURGERY Bilateral 2001  . APPENDECTOMY  1975  . FOREARM SURGERY Left 2001   "chain saw injury"  . FRACTURE SURGERY    . 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   Review of Systems  See HPI Objective:   Physical Exam BP 122/74 (BP Location:  Left Arm, Patient Position: Sitting, Cuff Size: Large) Comment (BP Location): forearm  Pulse 70   Temp 97.6 F (36.4 C) (Other (Comment)) Comment (Src): thernascan  Ht 5' 7"  (1.702 m)   Wt (!) 352 lb (159.7 kg)   SpO2 98%   BMI 55.13 kg/m  Pleasant middle-aged white man looking older than stated age, morbidly obese in no acute distress The eyes are anicteric The lungs are clear Heart sounds are distant but normal The abdomen is very obese and diffusely mildly tender without mass He has an appropriate mood and affect and appears alert and oriented x3

## 2018-10-28 NOTE — H&P (View-Only) (Signed)
Christian Guerra 58 y.o. 1961-01-14 [id number]  Assessment & Plan:   Encounter Diagnoses  Name Primary?  . Loss of weight Yes  . Abdominal pain, epigastric   . Change in bowel habits   . Kidney lesion, native, left     EGD and colonoscopy at [facility] (BMI>50)  MRI kidneys set up for 7/12  The risks and benefits as well as alternatives of endoscopic procedure(s) have been discussed and reviewed. All questions answered. The patient agrees to proceed.  Continue PEG 3350 (aka MiraLax)  Subjective:   Chief Complaint: weight loss, new constipation, epigastric pain  HPI Christian Guerra is a 58 year old white man seen through telehealth last week with complaints of profound weight loss and new constipation problems.  Severe abdominal pain after eating.  Epigastric pain, food feels like it was sitting there and he could not move his bowels regularly.  Dr. Wynetta Emery had done abdominal films and labs which were unrevealing.  I ordered a CT of the abdomen and pelvis which did not show any obvious GI malignancy or problems, he did have a 7 mm lesion in the lower pole of the left kidney and an MRI has been scheduled to evaluate that to see if that is a malignancy.  He is improved after the CT scan and using regular MiraLAX moving his bowels better.  He still has this postprandial heavy epigastric pain about an hour after eating and feels like food is sitting in the top of his stomach he says.  He has not been vomiting.  He is eating a little bit better he had a hard boiled egg this morning and tolerated that.   Wt Readings from Last 3 Encounters:  10/28/18 (!) 352 lb (159.7 kg)  10/23/18 (!) 352 lb (159.7 kg)  10/23/18 (!) 352 lb (159.7 kg)   He was 176 kg or 390 pounds in February of this year Allergies  Allergen Reactions  . Codeine Itching   Current Meds  Medication Sig  . albuterol (VENTOLIN HFA) 108 (90 Base) MCG/ACT inhaler Inhale 1 puff into the lungs every 6 (six) hours as needed for wheezing  or shortness of breath.  Marland Kitchen atenolol (TENORMIN) 50 MG tablet Take 1 tablet (50 mg total) by mouth daily.  . minocycline (DYNACIN) 100 MG tablet Take 100 mg by mouth 3 (three) times daily.   Marland Kitchen omeprazole (PRILOSEC) 20 MG capsule Take 1 capsule (20 mg total) by mouth daily.  Marland Kitchen oxyCODONE (ROXICODONE) 15 MG immediate release tablet Take 15 mg by mouth every 8 (eight) hours as needed for pain.  . polyethylene glycol (GOLYTELY) 236 g solution Drink 2-3 glasses x 1 PO.  . senna-docusate (SENOKOT S) 8.6-50 MG tablet Take 1 tablet by mouth daily.  . tamsulosin (FLOMAX) 0.4 MG CAPS capsule Take 1 capsule (0.4 mg total) by mouth daily.   Past Medical History:  Diagnosis Date  . Anxiety   . Bruised kidney 09/10/2016  . Chronic lower back pain   . Chronic pain   . Daily headache   . Diastolic dysfunction with chronic heart failure ([place])   . GERD (gastroesophageal reflux disease)   . Hepatitis C    "treated; got down to 0 load 6-8 years ago" (09/13/2016)  . Hypertension   . Memory difficulties    "in the last week or so" (09/13/2016)  . Morbid obesity ([place])   . Personal history of colonic adenoma 01/20/2013  . Recurrent falls    "recently" (09/13/2016)  . Sleep apnea    "  wore mask at night before; couldn't tolerate it" (09/13/2016)   Past Surgical History:  Procedure Laterality Date  . ANKLE FRACTURE SURGERY Bilateral 2001  . APPENDECTOMY  1975  . FOREARM SURGERY Left 2001   "chain saw injury"  . FRACTURE SURGERY    . 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   Review of Systems  See HPI Objective:   Physical Exam BP 122/74 (BP Location:  Left Arm, Patient Position: Sitting, Cuff Size: Large) Comment (BP Location): forearm  Pulse 70   Temp 97.6 F (36.4 C) (Other (Comment)) Comment (Src): thernascan  Ht 5' 7"  (1.702 m)   Wt (!) 352 lb (159.7 kg)   SpO2 98%   BMI 55.13 kg/m  Pleasant middle-aged white man looking older than stated age, morbidly obese in no acute distress The eyes are anicteric The lungs are clear Heart sounds are distant but normal The abdomen is very obese and diffusely mildly tender without mass He has an appropriate mood and affect and appears alert and oriented x3

## 2018-10-30 ENCOUNTER — Ambulatory Visit (HOSPITAL_COMMUNITY): Payer: Self-pay

## 2018-11-02 ENCOUNTER — Ambulatory Visit (HOSPITAL_COMMUNITY)
Admission: RE | Admit: 2018-11-02 | Discharge: 2018-11-02 | Disposition: A | Discharge status: Home / Self Care | Payer: Self-pay | Source: Ambulatory Visit | Attending: Internal Medicine | Admitting: Internal Medicine

## 2018-11-02 DIAGNOSIS — R93429 Abnormal radiologic findings on diagnostic imaging of unspecified kidney: Secondary | ICD-10-CM | POA: Insufficient documentation

## 2018-11-02 MED ORDER — GADOBUTROL 1 MMOL/ML IV SOLN
10.0000 mL | Freq: Once | INTRAVENOUS | Status: AC | PRN
  Administered 2018-11-02: 10 mL via INTRAVENOUS

## 2018-11-03 NOTE — Progress Notes (Signed)
Mr. Tardif  The kidney abnormality is NOT cancer. Good news  Will explain more 7/21 when you have procedures at [facility].  Gatha Mayer, MD, Marval Regal

## 2018-11-07 ENCOUNTER — Other Ambulatory Visit (HOSPITAL_COMMUNITY)
Admission: RE | Admit: 2018-11-07 | Discharge: 2018-11-07 | Disposition: A | Discharge status: Home / Self Care | Payer: HRSA Program | Source: Ambulatory Visit | Attending: Internal Medicine | Admitting: Internal Medicine

## 2018-11-07 DIAGNOSIS — Z1159 Encounter for screening for other viral diseases: Secondary | ICD-10-CM | POA: Diagnosis present

## 2018-11-07 LAB — SARS CORONAVIRUS 2 (TAT 6-24 HRS): SARS Coronavirus 2: NEGATIVE

## 2018-11-10 ENCOUNTER — Encounter (HOSPITAL_COMMUNITY): Payer: Self-pay

## 2018-11-10 ENCOUNTER — Other Ambulatory Visit: Payer: Self-pay

## 2018-11-10 NOTE — Progress Notes (Signed)
SPOKE W/  [place]     SCREENING SYMPTOMS OF COVID 19:   COUGH-- NO  RUNNY NOSE---NO   SORE THROAT---NO  NASAL CONGESTION----NO  SNEEZING----NO  SHORTNESS OF BREATH---NO  DIFFICULTY BREATHING---NO  TEMP >100.0 -----NO  UNEXPLAINED BODY ACHES------NO  CHILLS -------- NO  HEADACHES ---------NO  LOSS OF SMELL/ TASTE --------NO    HAVE YOU OR ANY FAMILY MEMBER TRAVELLED PAST 14 DAYS OUT OF THE   COUNTY---NO STATE----NO COUNTRY----NO  HAVE YOU OR ANY FAMILY MEMBER BEEN EXPOSED TO ANYONE WITH COVID 19? NO

## 2018-11-11 ENCOUNTER — Ambulatory Visit (HOSPITAL_COMMUNITY): Payer: Self-pay | Admitting: Certified Registered Nurse Anesthetist

## 2018-11-11 ENCOUNTER — Encounter (HOSPITAL_COMMUNITY)
Admission: RE | Disposition: A | Discharge status: Home / Self Care | Payer: Self-pay | Source: Home / Self Care | Attending: Internal Medicine

## 2018-11-11 ENCOUNTER — Encounter (HOSPITAL_COMMUNITY): Payer: Self-pay | Admitting: Certified Registered Nurse Anesthetist

## 2018-11-11 ENCOUNTER — Ambulatory Visit (HOSPITAL_COMMUNITY)
Admission: RE | Admit: 2018-11-11 | Discharge: 2018-11-11 | Disposition: A | Discharge status: Home / Self Care | Payer: Self-pay | Attending: Internal Medicine | Admitting: Internal Medicine

## 2018-11-11 DIAGNOSIS — K635 Polyp of colon: Secondary | ICD-10-CM

## 2018-11-11 DIAGNOSIS — Z86010 Personal history of colonic polyps: Secondary | ICD-10-CM | POA: Insufficient documentation

## 2018-11-11 DIAGNOSIS — K219 Gastro-esophageal reflux disease without esophagitis: Secondary | ICD-10-CM | POA: Insufficient documentation

## 2018-11-11 DIAGNOSIS — R1013 Epigastric pain: Secondary | ICD-10-CM | POA: Insufficient documentation

## 2018-11-11 DIAGNOSIS — I10 Essential (primary) hypertension: Secondary | ICD-10-CM | POA: Insufficient documentation

## 2018-11-11 DIAGNOSIS — Z792 Long term (current) use of antibiotics: Secondary | ICD-10-CM | POA: Insufficient documentation

## 2018-11-11 DIAGNOSIS — K5900 Constipation, unspecified: Secondary | ICD-10-CM | POA: Insufficient documentation

## 2018-11-11 DIAGNOSIS — Z6841 Body Mass Index (BMI) 40.0 and over, adult: Secondary | ICD-10-CM | POA: Insufficient documentation

## 2018-11-11 DIAGNOSIS — Z79899 Other long term (current) drug therapy: Secondary | ICD-10-CM | POA: Insufficient documentation

## 2018-11-11 DIAGNOSIS — G4730 Sleep apnea, unspecified: Secondary | ICD-10-CM | POA: Insufficient documentation

## 2018-11-11 DIAGNOSIS — D124 Benign neoplasm of descending colon: Secondary | ICD-10-CM | POA: Insufficient documentation

## 2018-11-11 DIAGNOSIS — R194 Change in bowel habit: Secondary | ICD-10-CM | POA: Insufficient documentation

## 2018-11-11 DIAGNOSIS — R634 Abnormal weight loss: Secondary | ICD-10-CM

## 2018-11-11 DIAGNOSIS — K317 Polyp of stomach and duodenum: Secondary | ICD-10-CM | POA: Diagnosis present

## 2018-11-11 DIAGNOSIS — Z885 Allergy status to narcotic agent status: Secondary | ICD-10-CM | POA: Insufficient documentation

## 2018-11-11 HISTORY — DX: Effusion, right foot: M25.474

## 2018-11-11 HISTORY — PX: POLYPECTOMY: SHX5525

## 2018-11-11 HISTORY — DX: Effusion, left ankle: M25.472

## 2018-11-11 HISTORY — PX: BIOPSY: SHX5522

## 2018-11-11 HISTORY — DX: Effusion, right ankle: M25.471

## 2018-11-11 HISTORY — PX: ESOPHAGOGASTRODUODENOSCOPY (EGD) WITH PROPOFOL: SHX5813

## 2018-11-11 HISTORY — DX: Effusion, left foot: M25.475

## 2018-11-11 HISTORY — DX: Presence of spectacles and contact lenses: Z97.3

## 2018-11-11 HISTORY — PX: COLONOSCOPY WITH PROPOFOL: SHX5780

## 2018-11-11 SURGERY — ESOPHAGOGASTRODUODENOSCOPY (EGD) WITH PROPOFOL
Anesthesia: Monitor Anesthesia Care

## 2018-11-11 MED ORDER — LACTATED RINGERS IV SOLN
INTRAVENOUS | Status: DC | PRN
  Administered 2018-11-11: 10:00:00 via INTRAVENOUS

## 2018-11-11 MED ORDER — PROPOFOL 10 MG/ML IV BOLUS
INTRAVENOUS | Status: AC
  Filled 2018-11-11: qty 20

## 2018-11-11 MED ORDER — PHENYLEPHRINE HCL (PRESSORS) 10 MG/ML IV SOLN
INTRAVENOUS | Status: DC | PRN
  Administered 2018-11-11: 80 ug via INTRAVENOUS

## 2018-11-11 MED ORDER — PROPOFOL 500 MG/50ML IV EMUL
INTRAVENOUS | Status: DC | PRN
  Administered 2018-11-11 (×2): 30 mg via INTRAVENOUS

## 2018-11-11 MED ORDER — PROPOFOL 500 MG/50ML IV EMUL
INTRAVENOUS | Status: DC | PRN
  Administered 2018-11-11: 175 ug/kg/min via INTRAVENOUS

## 2018-11-11 MED ORDER — SODIUM CHLORIDE 0.9 % IV SOLN: INTRAVENOUS | Status: DC

## 2018-11-11 MED ORDER — PROPOFOL 10 MG/ML IV BOLUS
INTRAVENOUS | Status: AC
  Filled 2018-11-11: qty 60

## 2018-11-11 SURGICAL SUPPLY — 25 items

## 2018-11-11 NOTE — Interval H&P Note (Signed)
History and Physical Interval Note:  11/11/2018 10:01 AM  Christian Guerra  has presented today for surgery, with the diagnosis of wt loss, epigastric pain, change in bowels.  The various methods of treatment have been discussed with the patient and family. After consideration of risks, benefits and other options for treatment, the patient has consented to  Procedure(s): ESOPHAGOGASTRODUODENOSCOPY (EGD) WITH PROPOFOL (N/A) COLONOSCOPY WITH PROPOFOL (N/A) as a surgical intervention.  The patient's history has been reviewed, patient examined, no change in status, stable for surgery.  I have reviewed the patient's chart and labs.  Questions were answered to the patient's satisfaction.     Silvano Rusk

## 2018-11-11 NOTE — Discharge Instructions (Signed)
° °  No signs of cancer or anything bad seen today.  There were some innocent stomach polyps seen and biopsied and a tiny colon polyp removed.  This is good news.  I will let you know pathology results and when to have another routine colonoscopy by mail and/or My Chart.  You should continue MiraLax for constipation.  Anxiety may be contributing to how you feel.  Just to remind the MRI tells [place] that the kidney abnormality is NOT cancer and no need to worry about that.  I appreciate the opportunity to care for you. Gatha Mayer, MD, FACG   YOU HAD AN ENDOSCOPIC PROCEDURE TODAY: Refer to the procedure report and other information in the discharge instructions given to you for any specific questions about what was found during the examination. If this information does not answer your questions, please call Dr. Celesta Aver office at [phone] to clarify.   YOU SHOULD EXPECT: Some feelings of bloating in the abdomen. Passage of more gas than usual. Walking can help get rid of the air that was put into your GI tract during the procedure and reduce the bloating. If you had a lower endoscopy (such as a colonoscopy or flexible sigmoidoscopy) you may notice spotting of blood in your stool or on the toilet paper. Some abdominal soreness may be present for a day or two, also.  DIET: Your first meal following the procedure should be a light meal and then it is ok to progress to your normal diet. A half-sandwich or bowl of soup is an example of a good first meal. Heavy or fried foods are harder to digest and may make you feel nauseous or bloated. Drink plenty of fluids but you should avoid alcoholic beverages for 24 hours.   ACTIVITY: Your care partner should take you home directly after the procedure. You should plan to take it easy, moving slowly for the rest of the day. You can resume normal activity the day after the procedure however YOU SHOULD NOT DRIVE, use power tools, machinery or perform  tasks that involve climbing or major physical exertion for 24 hours (because of the sedation medicines used during the test).   SYMPTOMS TO REPORT IMMEDIATELY: A gastroenterologist can be reached at any hour. Please call [phone]  for any of the following symptoms:  Following lower endoscopy (colonoscopy, flexible sigmoidoscopy) Excessive amounts of blood in the stool  Significant tenderness, worsening of abdominal pains  Swelling of the abdomen that is new, acute  Fever of 100 or higher  Following upper endoscopy (EGD, EUS, ERCP, esophageal dilation) Vomiting of blood or coffee ground material  New, significant abdominal pain  New, significant chest pain or pain under the shoulder blades  Painful or persistently difficult swallowing  New shortness of breath  Black, tarry-looking or red, bloody stools  FOLLOW UP:  If any biopsies were taken you will be contacted by phone or by letter within the next 1-3 weeks. Call [phone]  if you have not heard about the biopsies in 3 weeks.  Please also call with any specific questions about appointments or follow up tests.

## 2018-11-11 NOTE — Op Note (Signed)
[facility] Patient Name: Christian Guerra Procedure Date: 11/11/2018 MRN: [id number] Attending MD: Gatha Mayer , MD Date of Birth: August 18, 1960 CSN: [id number] Age: 58 Admit Type: Inpatient Procedure:                Colonoscopy Indications:              Personal history of colonic polyps, Change in bowel                            habits, Weight loss Providers:                Gatha Mayer, MD, Vista Lawman, RN, Ashley Jacobs, RN, Ladona Ridgel, Technician Referring MD:              Medicines:                Propofol per Anesthesia, Monitored Anesthesia Care Complications:            No immediate complications. Estimated Blood Loss:     Estimated blood loss was minimal. Procedure:                Pre-Anesthesia Assessment:                           - Prior to the procedure, a History and Physical                            was performed, and patient medications and                            allergies were reviewed. The patient's tolerance of                            previous anesthesia was also reviewed. The risks                            and benefits of the procedure and the sedation                            options and risks were discussed with the patient.                            All questions were answered, and informed consent                            was obtained. Prior Anticoagulants: The patient has                            taken no previous anticoagulant or antiplatelet                            agents. ASA Grade Assessment: III - A patient with  severe systemic disease. After reviewing the risks                            and benefits, the patient was deemed in                            satisfactory condition to undergo the procedure.                           - Prior to the procedure, a History and Physical                            was performed, and patient medications and               allergies were reviewed. The patient's tolerance of                            previous anesthesia was also reviewed. The risks                            and benefits of the procedure and the sedation                            options and risks were discussed with the patient.                            All questions were answered, and informed consent                            was obtained. Prior Anticoagulants: The patient has                            taken no previous anticoagulant or antiplatelet                            agents. ASA Grade Assessment: III - A patient with                            severe systemic disease. After reviewing the risks                            and benefits, the patient was deemed in                            satisfactory condition to undergo the procedure.                           After obtaining informed consent, the colonoscope                            was passed under direct vision. Throughout the                            procedure, the  patient's blood pressure, pulse, and                            oxygen saturations were monitored continuously. The                            CF-HQ190L ([id number]) Olympus colonoscope was                            introduced through the anus and advanced to the the                            cecum, identified by appendiceal orifice and                            ileocecal valve. The colonoscopy was performed                            without difficulty. The patient tolerated the                            procedure well. The quality of the bowel                            preparation was excellent. The ileocecal valve,                            appendiceal orifice, and rectum were photographed.                            The bowel preparation used was SUPREP via split                            dose instruction. Scope In: 10:17:39 AM Scope Out: 10:30:45 AM Scope Withdrawal Time: 0 hours 10  minutes 58 seconds  Total Procedure Duration: 0 hours 13 minutes 6 seconds  Findings:      The perianal and digital rectal examinations were normal. Pertinent       negatives include normal prostate (size, shape, and consistency).      A diminutive polyp was found in the descending colon. The polyp was       sessile. The polyp was removed with a cold snare. Resection and       retrieval were complete. Verification of patient identification for the       specimen was done. Estimated blood loss was minimal.      The exam was otherwise without abnormality on direct and retroflexion       views. Impression:               - One diminutive polyp in the descending colon,                            removed with a cold snare. Resected and retrieved.                           - The examination was  otherwise normal on direct                            and retroflexion views.                           - Personal history of colonic polyps. Moderate Sedation:      Not Applicable - Patient had care per Anesthesia. Recommendation:           - Patient has a contact number available for                            emergencies. The signs and symptoms of potential                            delayed complications were discussed with the                            patient. Return to normal activities tomorrow.                            Written discharge instructions were provided to the                            patient.                           - Resume previous diet.                           - Continue present medications.                           - Repeat colonoscopy is recommended. The                            colonoscopy date will be determined after pathology                            results from today's exam become available for                            review.                           - Stay on MiraLAx for constipation Procedure Code(s):        --- Professional ---                            [phone], Colonoscopy, flexible; with removal of                            tumor(s), polyp(s), or other lesion(s) by snare                            technique Diagnosis Code(s):        --- Professional ---  K63.5, Polyp of colon                           Z86.010, Personal history of colonic polyps                           R19.4, Change in bowel habit                           R63.4, Abnormal weight loss CPT copyright 2019 American Medical Association. All rights reserved. The codes documented in this report are preliminary and upon coder review may  be revised to meet current compliance requirements. Gatha Mayer, MD 11/11/2018 10:50:19 AM This report has been signed electronically. Number of Addenda: 0

## 2018-11-11 NOTE — Transfer of Care (Signed)
Immediate Anesthesia Transfer of Care Note  Patient: Christian Guerra  Procedure(s) Performed: ESOPHAGOGASTRODUODENOSCOPY (EGD) WITH PROPOFOL (N/A ) COLONOSCOPY WITH PROPOFOL (N/A ) POLYPECTOMY  Patient Location: PACU  Anesthesia Type:MAC  Level of Consciousness: awake, alert  and oriented  Airway & Oxygen Therapy: Patient Spontanous Breathing and Patient connected to nasal cannula oxygen  Post-op Assessment: Report given to RN and Post -op Vital signs reviewed and stable  Post vital signs: Reviewed and stable  Last Vitals:  Vitals Value Taken Time  BP    Temp    Pulse    Resp 18 11/11/18 1038  SpO2    Vitals shown include unvalidated device data.  Last Pain:  Vitals:   11/11/18 0937  TempSrc: Oral  PainSc: 0-No pain         Complications: No apparent anesthesia complications

## 2018-11-11 NOTE — Anesthesia Preprocedure Evaluation (Signed)
Anesthesia Evaluation  Patient identified by MRN, date of birth, ID band Patient awake    Reviewed: Allergy & Precautions, NPO status , Patient's Chart, lab work & pertinent test results  Airway Mallampati: II  TM Distance: >3 FB Neck ROM: Full    Dental no notable dental hx.    Pulmonary sleep apnea ,    Pulmonary exam normal breath sounds clear to auscultation       Cardiovascular hypertension, negative cardio ROS Normal cardiovascular exam Rhythm:Regular Rate:Normal     Neuro/Psych  Headaches, Anxiety Depression negative psych ROS   GI/Hepatic GERD  ,(+) Hepatitis -, C  Endo/Other  Morbid obesity  Renal/GU negative Renal ROS  negative genitourinary   Musculoskeletal negative musculoskeletal ROS (+)   Abdominal (+) + obese,   Peds negative pediatric ROS (+)  Hematology negative hematology ROS (+)   Anesthesia Other Findings   Reproductive/Obstetrics negative OB ROS                             Anesthesia Physical Anesthesia Plan  ASA: III  Anesthesia Plan: MAC   Post-op Pain Management:    Induction: Intravenous  PONV Risk Score and Plan: 1 and Ondansetron and Treatment may vary due to age or medical condition  Airway Management Planned: Nasal Cannula  Additional Equipment:   Intra-op Plan:   Post-operative Plan:   Informed Consent: I have reviewed the patients History and Physical, chart, labs and discussed the procedure including the risks, benefits and alternatives for the proposed anesthesia with the patient or authorized representative who has indicated his/her understanding and acceptance.     Dental advisory given  Plan Discussed with: CRNA  Anesthesia Plan Comments:         Anesthesia Quick Evaluation

## 2018-11-11 NOTE — Anesthesia Postprocedure Evaluation (Signed)
Anesthesia Post Note  Patient: Eon Zunker Brugger  Procedure(s) Performed: ESOPHAGOGASTRODUODENOSCOPY (EGD) WITH PROPOFOL (N/A ) COLONOSCOPY WITH PROPOFOL (N/A ) POLYPECTOMY     Patient location during evaluation: Endoscopy Anesthesia Type: MAC Level of consciousness: awake and alert Pain management: pain level controlled Vital Signs Assessment: post-procedure vital signs reviewed and stable Respiratory status: spontaneous breathing, nonlabored ventilation and respiratory function stable Cardiovascular status: stable and blood pressure returned to baseline Postop Assessment: no apparent nausea or vomiting Anesthetic complications: no    Last Vitals:  Vitals:   11/11/18 1045 11/11/18 1050  BP: 110/77 132/68  Pulse: 60   Resp: (!) 30   Temp:    SpO2: 100%     Last Pain:  Vitals:   11/11/18 1120  TempSrc:   PainSc: 0-No pain                 Lynda Rainwater

## 2018-11-11 NOTE — Op Note (Signed)
[facility] Patient Name: Christian Guerra Procedure Date: 11/11/2018 MRN: [id number] Attending MD: Gatha Mayer , MD Date of Birth: Dec 29, 1960 CSN: [id number] Age: 58 Admit Type: Inpatient Procedure:                Upper GI endoscopy Indications:              Epigastric abdominal pain, Weight loss Providers:                Gatha Mayer, MD, Vista Lawman, RN, Ashley Jacobs, RN, Ladona Ridgel, Technician Referring MD:              Medicines:                Propofol per Anesthesia, Monitored Anesthesia Care Complications:            No immediate complications. Estimated Blood Loss:     Estimated blood loss was minimal. Procedure:                Pre-Anesthesia Assessment:                           - Prior to the procedure, a History and Physical                            was performed, and patient medications and                            allergies were reviewed. The patient's tolerance of                            previous anesthesia was also reviewed. The risks                            and benefits of the procedure and the sedation                            options and risks were discussed with the patient.                            All questions were answered, and informed consent                            was obtained. Prior Anticoagulants: The patient has                            taken no previous anticoagulant or antiplatelet                            agents. ASA Grade Assessment: III - A patient with                            severe systemic disease. After reviewing the risks  and benefits, the patient was deemed in                            satisfactory condition to undergo the procedure.                           After obtaining informed consent, the endoscope was                            passed under direct vision. Throughout the                            procedure, the patient's blood  pressure, pulse, and                            oxygen saturations were monitored continuously. The                            [id number]-[id number] ([id number]) Olympus gastroscope was                            introduced through the mouth, and advanced to the                            second part of duodenum. The upper GI endoscopy was                            accomplished without difficulty. The patient                            tolerated the procedure well. Scope In: Scope Out: Findings:      Multiple diminutive semi-sessile polyps with no stigmata of recent       bleeding were found in the gastric fundus and in the gastric body.       Biopsies were taken with a cold forceps for histology. Verification of       patient identification for the specimen was done. Estimated blood loss       was minimal.      The exam was otherwise without abnormality.      The cardia and gastric fundus were normal on retroflexion. Impression:               - Multiple gastric polyps. Biopsied.                           - The examination was otherwise normal. Moderate Sedation:      Not Applicable - Patient had care per Anesthesia. Recommendation:           - Patient has a contact number available for                            emergencies. The signs and symptoms of potential                            delayed complications were discussed with the  patient. Return to normal activities tomorrow.                            Written discharge instructions were provided to the                            patient.                           - Resume previous diet.                           - Continue present medications.                           - See the other procedure note for documentation of                            additional recommendations.                           - Await pathology results.                           - Not on a PPI or H2 B - my sense was sxs not                             acid-peptic                           Once I see path results will revisit with ihim by                            phone re Sxs and consider Rx Procedure Code(s):        --- Professional ---                           [phone], Esophagogastroduodenoscopy, flexible,                            transoral; with biopsy, single or multiple Diagnosis Code(s):        --- Professional ---                           K31.7, Polyp of stomach and duodenum                           R10.13, Epigastric pain                           R63.4, Abnormal weight loss CPT copyright 2019 American Medical Association. All rights reserved. The codes documented in this report are preliminary and upon coder review may  be revised to meet current compliance requirements. Gatha Mayer, MD 11/11/2018 10:45:52 AM This report has been signed electronically. Number of Addenda: 0

## 2018-11-12 ENCOUNTER — Encounter (HOSPITAL_COMMUNITY): Payer: Self-pay | Admitting: Internal Medicine

## 2018-11-13 ENCOUNTER — Encounter: Payer: Self-pay | Admitting: Internal Medicine

## 2018-11-13 NOTE — Progress Notes (Signed)
Polyps in stomach and colon are benign Please put in 2027 colon recall and get a sx update - abd pain, bowel habits

## 2018-11-20 ENCOUNTER — Ambulatory Visit: Payer: Self-pay | Admitting: Internal Medicine

## 2018-11-20 ENCOUNTER — Other Ambulatory Visit: Payer: Self-pay

## 2018-11-20 NOTE — Progress Notes (Signed)
Virtual Visit via Telephone Note   Pt no showed for his in person appt I called a 9:16 a.m shortly after my CMA called him to see if he wanted to do it as a tele-med visit.  Pt did not answer when I called.  I left message that he can call back to reshedule

## 2018-11-20 NOTE — Progress Notes (Signed)
Pt states his pain is coming from all over his body

## 2018-12-11 ENCOUNTER — Other Ambulatory Visit: Payer: Self-pay

## 2018-12-11 ENCOUNTER — Ambulatory Visit: Payer: Self-pay | Attending: Internal Medicine | Admitting: Internal Medicine

## 2018-12-11 ENCOUNTER — Encounter: Payer: Self-pay | Admitting: Internal Medicine

## 2018-12-11 DIAGNOSIS — K5903 Drug induced constipation: Secondary | ICD-10-CM

## 2018-12-11 DIAGNOSIS — N2889 Other specified disorders of kidney and ureter: Secondary | ICD-10-CM

## 2018-12-11 DIAGNOSIS — Z86010 Personal history of colonic polyps: Secondary | ICD-10-CM

## 2018-12-11 DIAGNOSIS — N281 Cyst of kidney, acquired: Secondary | ICD-10-CM

## 2018-12-11 DIAGNOSIS — K317 Polyp of stomach and duodenum: Secondary | ICD-10-CM

## 2018-12-11 DIAGNOSIS — R350 Frequency of micturition: Secondary | ICD-10-CM

## 2018-12-11 DIAGNOSIS — R358 Other polyuria: Secondary | ICD-10-CM

## 2018-12-11 DIAGNOSIS — R3589 Other polyuria: Secondary | ICD-10-CM

## 2018-12-11 DIAGNOSIS — L304 Erythema intertrigo: Secondary | ICD-10-CM | POA: Insufficient documentation

## 2018-12-11 MED ORDER — NYSTATIN 100000 UNIT/GM EX CREA: 1.0000 "application " | TOPICAL_CREAM | Freq: Two times a day (BID) | CUTANEOUS | 2 refills | Status: DC

## 2018-12-11 MED ORDER — TAMSULOSIN HCL 0.4 MG PO CAPS: 0.4000 mg | ORAL_CAPSULE | Freq: Every day | ORAL | 6 refills | Status: DC

## 2018-12-11 NOTE — Progress Notes (Signed)
Virtual Visit via Telephone Note Due to current restrictions/limitations of in-office visits due to the COVID-19 pandemic, this scheduled clinical appointment was converted to a telehealth visit  I connected with Christian Guerra on 12/11/18 at 12:27 p.m by telephone and verified that I am speaking with the correct person using two identifiers. I am in my office.  The patient is at home.  Only the patient and myself participated in this encounter.  I discussed the limitations, risks, security and privacy concerns of performing an evaluation and management service by telephone and the availability of in person appointments. I also discussed with the patient that there may be a patient responsible charge related to this service. The patient expressed understanding and agreed to proceed.   History of Present Illness: Patient with history of HTN, OSA(noncompliant with CPAP), diastolic CHF, obesity,pre-DM,hep C(treated 2001),anxiety/bipolar (followed at [facility]), cystic acne. Last eval 09/2018  Wgh loss/Abd pain/Constipation: Since last visit with me he is seen Dr. Carlean Purl the gastroenterologist for his symptoms.  Had EGD which revealed multiple gastric polyps which were benign and colonoscopy for which benign polyps were also removed.  CAT scan of the abdomen reveals no acute findings within the abdomen or pelvis but did show marked hepatic steatosis.  Small lesion was seen in the left kidney and MRI was recommended.  MRI revealed that the small lesion seen in the left kidney was hemorrhagic cysts. Doing better but still gets gas Request RF on Miralax  Polyuria:  Doing well on Flomax.  Needs RF  Obesity:  wgh down to 341 lbs from 390 lbs in Feb 2020. Intentional for the most part but when he was having stomach issues last mth, his appetite was decreased.  Has a rash underneath arms, below abdominal folds and in groin area x several mths.  It itches and has foul order.     Outpatient  Encounter Medications as of 12/11/2018  Medication Sig Note  . albuterol (VENTOLIN HFA) 108 (90 Base) MCG/ACT inhaler Inhale 1 puff into the lungs every 6 (six) hours as needed for wheezing or shortness of breath.   Marland Kitchen atenolol (TENORMIN) 50 MG tablet Take 1 tablet (50 mg total) by mouth daily.   . cyclobenzaprine (FLEXERIL) 10 MG tablet Take 10 mg by mouth 3 (three) times daily as needed for muscle spasms.   Marland Kitchen gabapentin (NEURONTIN) 600 MG tablet Take 600 mg by mouth 3 (three) times daily. 11/10/2018: Not currently taking  . hydrochlorothiazide (HYDRODIURIL) 25 MG tablet Take 25 mg by mouth daily.   . meloxicam (MOBIC) 15 MG tablet Take 15 mg by mouth daily.   . minocycline (MINOCIN) 100 MG capsule Take 100 mg by mouth 3 (three) times daily. 11/04/2018: Continuous therapy  . omeprazole (PRILOSEC) 20 MG capsule Take 1 capsule (20 mg total) by mouth daily. (Patient taking differently: Take 20 mg by mouth daily at 2 PM. )   . oxyCODONE (ROXICODONE) 15 MG immediate release tablet Take 15 mg by mouth every 8 (eight) hours.    . polyethylene glycol (MIRALAX / GLYCOLAX) 17 g packet Take 17-34 g by mouth 2 (two) times daily as needed (constipation.).    Marland Kitchen senna-docusate (SENOKOT S) 8.6-50 MG tablet Take 1 tablet by mouth daily. (Patient taking differently: Take 1 tablet by mouth daily at 2 PM. )   . tamsulosin (FLOMAX) 0.4 MG CAPS capsule Take 1 capsule (0.4 mg total) by mouth daily. (Patient taking differently: Take 0.4 mg by mouth daily at 2 PM. )  No facility-administered encounter medications on file as of 12/11/2018.     Observations/Objective: No direct observation done as this was a telephone encounter.  Assessment and Plan: 1. Frequency of urination and polyuria Likely due to BPH as the patient has responded favorably to Flomax. - tamsulosin (FLOMAX) 0.4 MG CAPS capsule; Take 1 capsule (0.4 mg total) by mouth daily at 2 PM.  Dispense: 30 capsule; Refill: 6  2. Intertrigo Encouraged him to  keep the skin under the skin folds clean and dry. - nystatin cream (MYCOSTATIN); Apply 1 application topically 2 (two) times daily.  Dispense: 60 g; Refill: 2  3. Morbid obesity ([place]) Commended him on weight loss.  Encouraged him to eat smaller portion sizes and less white carbohydrates  4. Hemorrhage of cyst of native kidney No further work-up needed at this time  5. Drug-induced constipation Refill sent on MiraLAX  6. Gastric polyps 7. History of colon polyps Per Dr. Carlean Purl note, he will need repeat colonoscopy in 2027   Follow Up Instructions: Follow-up in 3 to 4 months   I discussed the assessment and treatment plan with the patient. The patient was provided an opportunity to ask questions and all were answered. The patient agreed with the plan and demonstrated an understanding of the instructions.   The patient was advised to call back or seek an in-person evaluation if the symptoms worsen or if the condition fails to improve as anticipated.  I provided 9 minutes of non-face-to-face time during this encounter.   Karle Plumber, MD

## 2018-12-18 ENCOUNTER — Telehealth: Payer: Self-pay | Admitting: Internal Medicine

## 2018-12-18 NOTE — Telephone Encounter (Signed)
-----   Message from Jackelyn Knife, [place] sent at 12/12/2018  2:09 PM EDT ----- Please contact pt and schedule a 4 month in person visit(morning)

## 2018-12-18 NOTE — Telephone Encounter (Signed)
Attempted to reach patient LVM to call back

## 2019-03-03 ENCOUNTER — Other Ambulatory Visit: Payer: Self-pay

## 2019-04-10 ENCOUNTER — Ambulatory Visit: Payer: Self-pay | Attending: Internal Medicine | Admitting: Internal Medicine

## 2019-04-10 ENCOUNTER — Other Ambulatory Visit: Payer: Self-pay

## 2019-08-10 ENCOUNTER — Ambulatory Visit: Payer: Self-pay

## 2020-08-11 ENCOUNTER — Encounter (INDEPENDENT_AMBULATORY_CARE_PROVIDER_SITE_OTHER): Payer: Self-pay

## 2020-08-30 ENCOUNTER — Encounter (HOSPITAL_COMMUNITY): Payer: Self-pay | Admitting: Physician Assistant

## 2020-08-30 ENCOUNTER — Encounter (HOSPITAL_COMMUNITY): Payer: Self-pay | Admitting: Licensed Clinical Social Worker

## 2020-08-30 ENCOUNTER — Ambulatory Visit (INDEPENDENT_AMBULATORY_CARE_PROVIDER_SITE_OTHER): Payer: No Payment, Other | Admitting: Licensed Clinical Social Worker

## 2020-08-30 ENCOUNTER — Other Ambulatory Visit: Payer: Self-pay

## 2020-08-30 ENCOUNTER — Telehealth (INDEPENDENT_AMBULATORY_CARE_PROVIDER_SITE_OTHER): Payer: No Payment, Other | Admitting: Physician Assistant

## 2020-08-30 DIAGNOSIS — F3130 Bipolar disorder, current episode depressed, mild or moderate severity, unspecified: Secondary | ICD-10-CM

## 2020-08-30 DIAGNOSIS — F411 Generalized anxiety disorder: Secondary | ICD-10-CM

## 2020-08-30 MED ORDER — FLUOXETINE HCL 20 MG PO CAPS
20.0000 mg | ORAL_CAPSULE | Freq: Every day | ORAL | 1 refills | Status: DC
  Filled 2020-08-30: qty 30, 30d supply, fill #0

## 2020-08-30 MED ORDER — LURASIDONE HCL 20 MG PO TABS
20.0000 mg | ORAL_TABLET | Freq: Every day | ORAL | 1 refills | Status: DC
  Filled 2020-08-30: qty 30, 30d supply, fill #0
  Filled 2020-09-28: qty 30, 30d supply, fill #1

## 2020-08-30 NOTE — Progress Notes (Signed)
Psychiatric Initial Adult Assessment   Virtual Visit via Telephone Note  I connected with Christian Guerra on 08/30/20 at  2:30 PM EDT by telephone and verified that I am speaking with the correct person using two identifiers.  Location: Patient: Home Provider: Clinic   I discussed the limitations, risks, security and privacy concerns of performing an evaluation and management service by telephone and the availability of in person appointments. I also discussed with the patient that there may be a patient responsible charge related to this service. The patient expressed understanding and agreed to proceed.  Follow Up Instructions:   I discussed the assessment and treatment plan with the patient. The patient was provided an opportunity to ask questions and all were answered. The patient agreed with the plan and demonstrated an understanding of the instructions.   The patient was advised to call back or seek an in-person evaluation if the symptoms worsen or if the condition fails to improve as anticipated.  I provided 33 minutes of non-face-to-face time during this encounter.  [name], PA   Patient Identification: Christian Guerra MRN:  [id number] Date of Evaluation:  08/30/2020 Referral Source: Referred by [facility] Well[facility] Chief Complaint:  "I'm having issues with my bipolar disorder." Visit Diagnosis:    ICD-10-CM   1. Bipolar I disorder, most recent episode depressed (HCC)  F31.30 lurasidone (LATUDA) 20 MG TABS tablet    FLUoxetine (PROZAC) 20 MG capsule  2. Generalized anxiety disorder  F41.1 FLUoxetine (PROZAC) 20 MG capsule    History of Present Illness:    Christian Guerra is a 60 year old male with a past psychiatric history significant for bipolar depression and generalized anxiety disorder who presents to [facility] via virtual telephone visit for "I'm having issues with my bipolar disorder."  Patient  reports that he has been without psychotropic medications for roughly 2 years and is having a hard time managing his symptoms.  Before being set up with [facility], patient reports that he was going to [facility] for the management of his depression and bipolar disorder.  While being seen at [facility], patient states that he was taking the following medications: Zoloft and Latuda.  Patient reports that he does not remember the dosages but remembers having to increase his dosages of the medications to effectively manage his symptoms.  Patient currently endorses the following symptoms: depressed mood, disturbed sleep, fatigue, and insomnia.  Patient states that he has been having terrible bouts of depression since being without his medications that has been known to suffer from hyperactivity, racing thoughts, and irritable mood.  Patient also endorses anxiety and rates his current anxiety a 10 out of 10.  Patient's anxiety is so debilitating that he has chewed off his fingernails.  Patient's stressors include being homeless, being unable to drive, body habitus (overweight).  He is currently living with his father and is often worried about what he is going to do in the future since his father is elderly.  Patient is currently unemployed but recently acquired food stamps.  Patient is currently working on the documents for SSI.  A PHQ-9 screen was performed with the patient scoring a 10.  A GAD-7 screen was also performed with the patient scoring a 21.  Patient is calm, cooperative, and fully engaged in conversation during the encounter.  Patient denies suicidal or homicidal ideations but states that he occasionally has fleeting thoughts of suicide from time to time.  Patient denies ever acting  on these thoughts.  Patient denies auditory or visual hallucinations and does not appear to be responding to internal/external stimuli.  Patient endorses fair sleep and receives on average 5 to 6 hours of sleep each night.  Patient  reports that he often has to get out of bed in order to urinate.  Patient endorses appetite and eats on average 2 meals per day.  Patient denies alcohol consumption, tobacco use, and illicit drug use.  Associated Signs/Symptoms: Depression Symptoms:  depressed mood, anhedonia, insomnia, psychomotor agitation, psychomotor retardation, fatigue, feelings of worthlessness/guilt, difficulty concentrating, hopelessness, impaired memory, recurrent thoughts of death, anxiety, panic attacks, loss of energy/fatigue, disturbed sleep, decreased labido, (Hypo) Manic Symptoms:  Delusions, Distractibility, Elevated Mood, Flight of Ideas, Grandiosity, Impulsivity, Irritable Mood, Labiality of Mood, Anxiety Symptoms:  Agoraphobia, Excessive Worry, Panic Symptoms, Obsessive Compulsive Symptoms:   Patient states that he dislikes odd numbers, Social Anxiety, Specific Phobias, Psychotic Symptoms:  Delusions, Paranoia, PTSD Symptoms: Had a traumatic exposure:  Patient states that losing his driver's license was traumatic. Patient has experienced the loss of someone close to him. Patient reports that he feels helpless.  Had a traumatic exposure in the last month:  N/A Re-experiencing:  Intrusive Thoughts Nightmares Hypervigilance:  Yes Hyperarousal:  Difficulty Concentrating Emotional Numbness/Detachment Increased Startle Response Irritability/Anger Sleep Avoidance:  Decreased Interest/Participation Foreshortened Future  Past Psychiatric History:  Bipolar Disorder Depression Generalized anxiety disorder  Previous Psychotropic Medications: Yes   Substance Abuse History in the last 12 months:  No.  Consequences of Substance Abuse: NA  Past Medical History:  Past Medical History:  Diagnosis Date  . Anxiety   . Bilateral swelling of feet and ankles   . Bruised kidney 09/10/2016  . Chronic lower back pain   . Chronic pain   . Daily headache   . Diastolic dysfunction with  chronic heart failure ([place])   . Fundic gland polyps of stomach, benign   . GERD (gastroesophageal reflux disease)   . Hepatitis C    "treated; got down to 0 load 6-8 years ago" (09/13/2016)  . Hypertension   . Memory difficulties    "in the last week or so" (09/13/2016)  . Morbid obesity ([place])   . Personal history of colonic adenoma 01/20/2013  . Recurrent falls    "recently" (09/13/2016)  . Sleep apnea    "wore mask at night before; couldn't tolerate it" (09/13/2016)  . Wears glasses     Past Surgical History:  Procedure Laterality Date  . ANKLE FRACTURE SURGERY Bilateral 2001  . APPENDECTOMY  1975  . BIOPSY  11/11/2018   Procedure: BIOPSY;  Surgeon: Gatha Mayer, MD;  Location: [facility];  Service: Endoscopy;;  . COLONOSCOPY    . COLONOSCOPY WITH PROPOFOL N/A 11/11/2018   Procedure: COLONOSCOPY WITH PROPOFOL;  Surgeon: Gatha Mayer, MD;  Location: [facility];  Service: Endoscopy;  Laterality: N/A;  . ESOPHAGOGASTRODUODENOSCOPY (EGD) WITH PROPOFOL N/A 11/11/2018   Procedure: ESOPHAGOGASTRODUODENOSCOPY (EGD) WITH PROPOFOL;  Surgeon: Gatha Mayer, MD;  Location: [facility];  Service: Endoscopy;  Laterality: N/A;  . FOREARM SURGERY Left 2001   "chain saw injury"  . POLYPECTOMY  11/11/2018   Procedure: POLYPECTOMY;  Surgeon: Gatha Mayer, MD;  Location: [facility];  Service: Endoscopy;;  . TESTICLE TORSION REDUCTION  1980    Family Psychiatric History: Patient denies a family history of psychiatric illness  Family History:  Family History  Problem Relation Age of Onset  . Hypertension Father  old age--16  . Other Father        enlarged prostate  . Colon cancer Neg Hx     Social History:   Social History   Socioeconomic History  . Marital status: Single    Spouse name: Not on file  . Number of children: 0  . Years of education: Not on file  . Highest education level: Not on file  Occupational History  . Occupation: unemployeed  Tobacco Use  .  Smoking status: Never Smoker  . Smokeless tobacco: Current User    Types: Snuff  Vaping Use  . Vaping Use: Never used  Substance and Sexual Activity  . Alcohol use: Not Currently    Comment: 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   . Drug use: Not Currently    Comment: 09/13/2016 "nothing for a long time; months"  . Sexual activity: Not Currently  Other Topics Concern  . 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 Difficulty of Paying Living Expenses: Hard  Food Insecurity: Food Insecurity Present  . Worried About Charity fundraiser in the Last Year: Sometimes true  . Ran Out of Food in the Last Year: Sometimes true  Transportation Needs: No Transportation Needs  . Lack of Transportation (Medical): No  . Lack of Transportation (Non-Medical): No  Physical Activity: Inactive  . Days of Exercise per Week: 0 days  . Minutes of Exercise per Session: 0 min  Stress: Stress Concern Present  . Feeling of Stress : Very much  Social Connections: Socially Isolated  . Frequency of Communication with Friends and Family: More than three times a week  . Frequency of Social Gatherings with Friends and Family: Never  . Attends Religious Services: Never  . Active Member of Clubs or Organizations: No  . Attends Archivist Meetings: Never  . Marital Status: Never married    Additional Social History:  Patient is currently living with his father.  Patient is unemployed and does not have access to a license.  Allergies:   Allergies  Allergen Reactions  . Codeine Itching    Metabolic Disorder Labs: Lab Results  Component Value Date   HGBA1C 5.8 (H) 10/14/2018   MPG  114 06/15/2016   MPG 108 09/23/2012   No results found for: PROLACTIN Lab Results  Component Value Date   CHOL 163 06/05/2018   TRIG 246 (H) 06/05/2018   HDL 37 (L) 06/05/2018   CHOLHDL 4.4 06/05/2018   VLDL 32 (H) 07/02/2016   LDLCALC 77 06/05/2018   LDLCALC 73 07/02/2016   Lab Results  Component Value Date   TSH 1.970 06/05/2018    Therapeutic Level Labs: No results found for: LITHIUM No results found for: CBMZ No results found for: VALPROATE  Current Medications: Current Outpatient Medications  Medication Sig Dispense Refill  . FLUoxetine (PROZAC) 20 MG capsule Take 1 capsule (20 mg total) by mouth daily. 30 capsule 1  . lurasidone (LATUDA) 20 MG TABS tablet Take 1 tablet (20 mg total) by mouth daily. 30 tablet 1  . albuterol (VENTOLIN HFA) 108 (90 Base) MCG/ACT inhaler Inhale 1 puff into the lungs every  6 (six) hours as needed for wheezing or shortness of breath. 1 Inhaler 0  . atenolol (TENORMIN) 50 MG tablet Take 1 tablet (50 mg total) by mouth daily. 30 tablet 2  . cyclobenzaprine (FLEXERIL) 10 MG tablet Take 10 mg by mouth 3 (three) times daily as needed for muscle spasms.    Marland Kitchen gabapentin (NEURONTIN) 600 MG tablet Take 600 mg by mouth 3 (three) times daily.    . hydrochlorothiazide (HYDRODIURIL) 25 MG tablet Take 25 mg by mouth daily.    . meloxicam (MOBIC) 15 MG tablet Take 15 mg by mouth daily.    . minocycline (MINOCIN) 100 MG capsule Take 100 mg by mouth 3 (three) times daily.    Marland Kitchen nystatin cream (MYCOSTATIN) Apply 1 application topically 2 (two) times daily. 60 g 2  . omeprazole (PRILOSEC) 20 MG capsule Take 1 capsule (20 mg total) by mouth daily. (Patient taking differently: Take 20 mg by mouth daily at 2 PM. ) 90 capsule 3  . oxyCODONE (ROXICODONE) 15 MG immediate release tablet Take 15 mg by mouth every 8 (eight) hours.     . polyethylene glycol (MIRALAX / GLYCOLAX) 17 g packet Take 17-34 g by mouth 2 (two) times daily as needed (constipation.).     Marland Kitchen  senna-docusate (SENOKOT S) 8.6-50 MG tablet Take 1 tablet by mouth daily. (Patient taking differently: Take 1 tablet by mouth daily at 2 PM. ) 30 tablet 2  . tamsulosin (FLOMAX) 0.4 MG CAPS capsule Take 1 capsule (0.4 mg total) by mouth daily at 2 PM. 30 capsule 6   No current facility-administered medications for this visit.    Musculoskeletal: Strength & Muscle Tone: Unable to assess due to telemedicine visit Christian Guerra: Unable to assess due to telemedicine visit Patient leans: Unable to assess due to telemedicine visit  Psychiatric Specialty Exam: Review of Systems  Psychiatric/Behavioral: Positive for dysphoric mood and sleep disturbance. Negative for decreased concentration, hallucinations, self-injury and suicidal ideas. The patient is nervous/anxious. The patient is not hyperactive.     There were no vitals taken for this visit.There is no height or weight on file to calculate BMI.  General Appearance: Unable to assess due to telemedicine visit  Eye Contact:  Unable to assess due to telemedicine visit  Speech:  Clear and Coherent and Normal Rate  Volume:  Normal  Mood:  Anxious, Depressed and Irritable  Affect:  Congruent and Depressed  Thought Process:  Coherent, Goal Directed and Descriptions of Associations: Intact  Orientation:  Full (Time, Place, and Person)  Thought Content:  WDL  Suicidal Thoughts:  No  Homicidal Thoughts:  No  Memory:  Immediate;   Good Recent;   Good Remote;   Good  Judgement:  Good  Insight:  Good  Psychomotor Activity:  Restlessness  Concentration:  Concentration: Good and Attention Span: Good  Recall:  Good  Fund of Knowledge:Fair  Language: Good  Akathisia:  NA  Handed:  Right  AIMS (if indicated):  not done  Assets:  Communication Skills Desire for Improvement Housing Social Support  ADL's:  Impaired  Cognition: WNL  Sleep:  Fair   Screenings: GAD-7   Flowsheet Row Video Visit from 08/30/2020 in [facility] Office Visit from 10/09/2018 in [place] Visit from 12/07/2016 in [place] Office Visit from 10/12/2016 in [place] Office Visit from 07/13/2016 in [place]  Total GAD-7 Score 21 21 0 15 7    PHQ2-9   Flowsheet Row Counselor from 08/30/2020 in [facility] Office Visit from 10/09/2018 in [place] [facility] from 09/17/2018 in [facility] Visit from 06/05/2018 in [place] Office Visit from 12/07/2016 in [place]  PHQ-2 Total Score 3 1 4 2  0  PHQ-9 Total Score 10 -- 7 8 1     Flowsheet Row Counselor from 08/30/2020 in [place] Low Risk      Assessment and Plan:   Christian Guerra is a 60 year old male with a past psychiatric history significant for bipolar depression and generalized anxiety disorder who presents to [facility] via virtual telephone visit for "I'm having issues with my bipolar disorder."  Patient endorses the following symptoms: epressed mood, disturbed sleep, fatigue, and insomnia.  Patient states that he has been having terrible bouts of depression since being without his medications that has been known to suffer from hyperactivity, racing thoughts, and irritable mood.  Patient has taken the following medications in the past: Latuda and Zoloft but does not remember the dosages he was on.  Patient was been without the medications for roughly 2 years.  Patient is interested in being placed back on Latuda and Zoloft.  Patient is also requesting that he be placed on the patient assistance program in order to be able to afford his Latuda.  Provider informed patient that he would be set up for the  patient assistance program.  Patient's medications to be prescribed to pharmacy of choice.  1. Bipolar I disorder, most recent episode depressed (HCC)  - lurasidone (LATUDA) 20 MG TABS tablet; Take 1 tablet (20 mg total) by mouth daily.  Dispense: 30 tablet; Refill: 1 - sertraline (ZOLOFT) 25 MG tablet; Take 1 tablet (25 mg total) by mouth daily.  Dispense: 30 tablet; Refill: 1  2. Generalized anxiety disorder  - sertraline (ZOLOFT) 25 MG tablet; Take 1 tablet (25 mg total) by mouth daily.  Dispense: 30 tablet; Refill: 1  Patient to follow-up in 6 weeks  [name], PA 5/10/20229:11 PM
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
## 2020-08-31 ENCOUNTER — Other Ambulatory Visit: Payer: Self-pay

## 2020-08-31 MED ORDER — SERTRALINE HCL 25 MG PO TABS
25.0000 mg | ORAL_TABLET | Freq: Every day | ORAL | 1 refills | Status: DC
  Filled 2020-08-31: qty 30, 30d supply, fill #0
  Filled 2020-09-28: qty 30, 30d supply, fill #1

## 2020-09-01 ENCOUNTER — Other Ambulatory Visit: Payer: Self-pay

## 2020-09-28 ENCOUNTER — Other Ambulatory Visit: Payer: Self-pay

## 2020-10-13 ENCOUNTER — Telehealth (INDEPENDENT_AMBULATORY_CARE_PROVIDER_SITE_OTHER): Payer: No Payment, Other | Admitting: Physician Assistant

## 2020-10-13 ENCOUNTER — Encounter (HOSPITAL_COMMUNITY): Payer: Self-pay | Admitting: Physician Assistant

## 2020-10-13 ENCOUNTER — Other Ambulatory Visit: Payer: Self-pay

## 2020-10-13 DIAGNOSIS — F3130 Bipolar disorder, current episode depressed, mild or moderate severity, unspecified: Secondary | ICD-10-CM | POA: Diagnosis not present

## 2020-10-13 DIAGNOSIS — F99 Mental disorder, not otherwise specified: Secondary | ICD-10-CM

## 2020-10-13 DIAGNOSIS — F410 Panic disorder [episodic paroxysmal anxiety] without agoraphobia: Secondary | ICD-10-CM | POA: Diagnosis not present

## 2020-10-13 DIAGNOSIS — F5105 Insomnia due to other mental disorder: Secondary | ICD-10-CM | POA: Diagnosis not present

## 2020-10-13 DIAGNOSIS — F411 Generalized anxiety disorder: Secondary | ICD-10-CM

## 2020-10-13 MED ORDER — LURASIDONE HCL 40 MG PO TABS
40.0000 mg | ORAL_TABLET | Freq: Every day | ORAL | 1 refills | Status: DC
  Filled 2020-10-13 – 2020-10-27 (×2): qty 30, 30d supply, fill #0
  Filled 2020-11-29: qty 30, 30d supply, fill #1

## 2020-10-13 MED ORDER — TRAZODONE HCL 50 MG PO TABS
50.0000 mg | ORAL_TABLET | Freq: Every day | ORAL | 1 refills | Status: DC
  Filled 2020-10-13 – 2020-10-27 (×2): qty 30, 30d supply, fill #0
  Filled 2020-11-29: qty 30, 30d supply, fill #1

## 2020-10-13 MED ORDER — SERTRALINE HCL 50 MG PO TABS
50.0000 mg | ORAL_TABLET | Freq: Every day | ORAL | 1 refills | Status: DC
  Filled 2020-10-13 – 2020-10-27 (×2): qty 30, 30d supply, fill #0
  Filled 2020-11-29: qty 30, 30d supply, fill #1

## 2020-10-13 NOTE — Progress Notes (Signed)
[place] MD/PA/NP OP Progress Note  Virtual Visit via Video Note  I connected with Christian Guerra on 10/13/20 at  1:30 PM EDT by a video enabled telemedicine application and verified that I am speaking with the correct person using two identifiers.  Location: Patient: Home Provider: Clinic   I discussed the limitations of evaluation and management by telemedicine and the availability of in person appointments. The patient expressed understanding and agreed to proceed.  Follow Up Instructions:  I discussed the assessment and treatment plan with the patient. The patient was provided an opportunity to ask questions and all were answered. The patient agreed with the plan and demonstrated an understanding of the instructions.   The patient was advised to call back or seek an in-person evaluation if the symptoms worsen or if the condition fails to improve as anticipated.  I provided 30 minutes of non-face-to-face time during this encounter.   Christian Mood, PA   10/13/2020 2:02 PM Christian Guerra  MRN:  [id number]  Chief Complaint: Follow up and medication management  HPI:   Christian Guerra. Doolen is a 60 year old male with a past psychiatric history significant for bipolar disorder and generalized anxiety disorder who presents to [facility] via virtual video visit for follow-up and medication management.  Patient is currently being managed on the following medications:  Fluoxetine 20 mg daily Lurasidone (Latuda) 20 mg daily for  Patient reports that he has not noticed too much of a difference in the management of his symptoms.  Patient reports that he is still anxious and continues the habit of biting his fingernails excessively.  Patient rates his anxiety a 10 out of 10.  Patient also endorses episodes of depression and states that his Guerra can be so low at times that he feels that even winning $1 million would not be able to prevent him from being  sad.  Patient states that his depressive symptoms tend to last for a few days and in order to alleviate his symptoms, patient states that he reads the Bible.  Patient denies any major stressors except for his cataracts and not having insurance.  Patient reports that he is in the process of applying for Medicaid.  A PHQ-9 screen was performed with the patient scoring a 19.  A GAD-7 screen was also performed with the patient scoring a 19.  Patient is calm, cooperative, and fully engaged in conversation during the encounter.  Patient reports that the following medications that he has taken in the past has proven helpful in the management of his symptoms: Latuda 80 mg daily, sertraline 100 mg daily, alprazolam 0.5 mg 2 times daily as needed, and zolpidem 10 mg at bedtime.  Although patient feels awful, patient states that today is a good day for him.  Patient denies suicidal or homicidal ideations.  He further denies auditory or visual hallucinations and does not appear to be responding to internal/external stimuli.  Patient endorses fair sleep and receives on average 4 to 6 hours of sleep each night.  Patient endorses good appetite and eats on average 2 meals a day with snacking in between.  Patient denies alcohol consumption and illicit drug use.  Patient endorses tobacco use in the form of snuff.   Visit Diagnosis:    ICD-10-CM   1. Insomnia due to other mental disorder  F51.05 traZODone (DESYREL) 50 MG tablet   F99     2. Bipolar I disorder, most recent episode depressed ([place])  F31.30 sertraline (ZOLOFT) 50 MG tablet    lurasidone (LATUDA) 40 MG TABS tablet    3. Generalized anxiety disorder  F41.1 sertraline (ZOLOFT) 50 MG tablet    4. Panic disorder  F41.0 sertraline (ZOLOFT) 50 MG tablet      Past Psychiatric History:  Panic disorder Insomnia Bipolar disorder  Past Medical History:  Past Medical History:  Diagnosis Date   Anxiety    Bilateral swelling of feet and ankles    Bruised  kidney 09/10/2016   Chronic lower back pain    Chronic pain    Daily headache    Diastolic dysfunction with chronic heart failure (HCC)    Fundic gland polyps of stomach, benign    GERD (gastroesophageal reflux disease)    Hepatitis C    "treated; got down to 0 load 6-8 years ago" (09/13/2016)   Hypertension    Memory difficulties    "in the last week or so" (09/13/2016)   Morbid obesity ([place])    Personal history of colonic adenoma 01/20/2013   Recurrent falls    "recently" (09/13/2016)   Sleep apnea    "wore mask at night before; couldn't tolerate it" (09/13/2016)   Wears glasses     Past Surgical History:  Procedure Laterality Date   ANKLE FRACTURE SURGERY Bilateral 2001   [place]   BIOPSY  11/11/2018   Procedure: BIOPSY;  Surgeon: Gatha Mayer, MD;  Location: [facility];  Service: Endoscopy;;   COLONOSCOPY     COLONOSCOPY WITH PROPOFOL N/A 11/11/2018   Procedure: COLONOSCOPY WITH PROPOFOL;  Surgeon: Gatha Mayer, MD;  Location: [facility];  Service: Endoscopy;  Laterality: N/A;   ESOPHAGOGASTRODUODENOSCOPY (EGD) WITH PROPOFOL N/A 11/11/2018   Procedure: ESOPHAGOGASTRODUODENOSCOPY (EGD) WITH PROPOFOL;  Surgeon: Gatha Mayer, MD;  Location: [facility];  Service: Endoscopy;  Laterality: N/A;   FOREARM SURGERY Left 2001   "chain saw injury"   POLYPECTOMY  11/11/2018   Procedure: POLYPECTOMY;  Surgeon: Gatha Mayer, MD;  Location: [facility];  Service: Endoscopy;;   TESTICLE TORSION REDUCTION  1980    Family Psychiatric History:  Patient denies a family history of psychiatric illness  Family History:  Family History  Problem Relation Age of Onset   Hypertension Father        old age--61   Other Father        enlarged prostate   Colon cancer Neg Hx     Social History:  Social History   Socioeconomic History   Marital status: Single    Spouse name: Not on file   Number of children: 0   Years of education: Not on file   Highest education  level: Not on file  Occupational History   Occupation: unemployeed  Tobacco Use   Smoking status: Never   Smokeless tobacco: Current    Types: Snuff  Vaping Use   Vaping Use: Never used  Substance and Sexual Activity   Alcohol use: Not Currently    Comment: 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, not now, and non-smoker   Social Determinants of Health   Financial Resource Strain: High Risk   Difficulty of Paying Living Expenses: Hard  Food Insecurity: Food Insecurity Present   Worried About Running Out of Food in the Last Year: Sometimes true   Ran Out of Food in the Last Year: Sometimes true  Transportation Needs: No Transportation Needs   Lack of Transportation (Medical): No   Lack of Transportation (Non-Medical): No  Physical Activity: Inactive   Days of Exercise per Week: 0 days   Minutes of Exercise per Session: 0 min  Stress: Stress Concern Present   Feeling of Stress : Very much  Social Connections: Socially Isolated   Frequency of Communication with Friends and Family: More than three times a week   Frequency of Social Gatherings with Friends and Family: Never   Attends Religious Services: Never   Marine scientist or Organizations: No   Attends Archivist Meetings: Never   Marital Status: Never married    Allergies:  Allergies  Allergen Reactions   Codeine Itching    Metabolic Disorder Labs: Lab Results  Component Value Date   HGBA1C 5.8 (H) 10/14/2018   MPG 114 06/15/2016   MPG 108 09/23/2012   No results found for: PROLACTIN Lab Results   Component Value Date   CHOL 163 06/05/2018   TRIG 246 (H) 06/05/2018   HDL 37 (L) 06/05/2018   CHOLHDL 4.4 06/05/2018   VLDL 32 (H) 07/02/2016   LDLCALC 77 06/05/2018   LDLCALC 73 07/02/2016   Lab Results  Component Value Date   TSH 1.970 06/05/2018   TSH 1.395 09/14/2016    Therapeutic Level Labs: No results found for: LITHIUM No results found for: VALPROATE No components found for:  CBMZ  Current Medications: Current Outpatient Medications  Medication Sig Dispense Refill   lurasidone (LATUDA) 40 MG TABS tablet Take 1 tablet (40 mg total) by mouth daily with breakfast. 30 tablet 1   traZODone (DESYREL) 50 MG tablet Take 1 tablet (50 mg total) by mouth at bedtime. 30 tablet 1   albuterol (VENTOLIN HFA) 108 (90 Base) MCG/ACT inhaler Inhale 1 puff into the lungs every 6 (six) hours as needed for wheezing or shortness of breath. 1 Inhaler 0   atenolol (TENORMIN) 50 MG tablet Take 1 tablet (50 mg total) by mouth daily. 30 tablet 2   cyclobenzaprine (FLEXERIL) 10 MG tablet Take 10 mg by mouth 3 (three) times daily as needed for muscle spasms.     gabapentin (NEURONTIN) 600 MG tablet Take 600 mg by mouth 3 (three) times daily.     hydrochlorothiazide (HYDRODIURIL) 25 MG tablet Take 25 mg by mouth daily.     meloxicam (MOBIC) 15 MG tablet Take 15 mg by mouth daily.     minocycline (MINOCIN) 100 MG capsule Take 100 mg by mouth 3 (three) times daily.     nystatin cream (MYCOSTATIN) Apply 1 application topically 2 (two) times daily. 60 g 2   omeprazole (PRILOSEC) 20 MG capsule Take 1 capsule (20 mg total) by mouth daily. (Patient taking differently: Take 20 mg by mouth daily at 2 PM. ) 90 capsule 3   oxyCODONE (ROXICODONE) 15 MG immediate release tablet Take 15 mg by mouth every 8 (eight) hours.      polyethylene glycol (MIRALAX / GLYCOLAX) 17 g packet Take 17-34 g by mouth 2 (two) times daily as needed (constipation.).      senna-docusate (SENOKOT S) 8.6-50 MG tablet  Take 1 tablet by  mouth daily. (Patient taking differently: Take 1 tablet by mouth daily at 2 PM. ) 30 tablet 2   sertraline (ZOLOFT) 50 MG tablet Take 1 tablet (50 mg total) by mouth daily. 30 tablet 1   tamsulosin (FLOMAX) 0.4 MG CAPS capsule Take 1 capsule (0.4 mg total) by mouth daily at 2 PM. 30 capsule 6   No current facility-administered medications for this visit.     Musculoskeletal: Strength & Muscle Tone: Unable to assess due to telemedicine visit [place]: Unable to assess due to telemedicine visit Patient leans: Unable to assess due to telemedicine visit  Psychiatric Specialty Exam: Review of Systems  Psychiatric/Behavioral:  Positive for dysphoric Guerra and sleep disturbance. Negative for decreased concentration, hallucinations, self-injury and suicidal ideas. The patient is nervous/anxious. The patient is not hyperactive.    There were no vitals taken for this visit.There is no height or weight on file to calculate BMI.  General Appearance: Well Groomed  Eye Contact:  Good  Speech:  Clear and Coherent and Normal Rate  Volume:  Normal  Guerra:  Anxious, Depressed, and Dysphoric  Affect:  Congruent and Depressed  Thought Process:  Coherent, Goal Directed, and Descriptions of Associations: Intact  Orientation:  Full (Time, Place, and Person)  Thought Content: WDL   Suicidal Thoughts:  No  Homicidal Thoughts:  No  Memory:  Immediate;   Good Recent;   Good Remote;   Good  Judgement:  Good  Insight:  Good  Psychomotor Activity:  Restlessness  Concentration:  Concentration: Good and Attention Span: Good  Recall:  Good  Fund of Knowledge: Fair  Language: Good  Akathisia:  NA  Handed:  Right  AIMS (if indicated): not done  Assets:  Communication Skills Desire for Improvement Housing Social Support  ADL's:  Impaired  Cognition: WNL  Sleep:  Fair   Screenings: GAD-7    Flowsheet Row Video Visit from 10/13/2020 in [facility] Video Visit from  08/30/2020 in [facility] Office Visit from 10/09/2018 in [place] Office Visit from 12/07/2016 in [place] Office Visit from 10/12/2016 in [place]  Total GAD-7 Score 19 21 21  0 15      PHQ2-9    Flowsheet Row Video Visit from 10/13/2020 in [facility] Counselor from 08/30/2020 in [facility] Office Visit from 10/09/2018 in [place] from 09/17/2018 in [facility] Office Visit from 06/05/2018 in [place]  PHQ-2 Total Score 4 3 1 4 2   PHQ-9 Total Score 19 10 -- 7 8      Flowsheet Row Video Visit from 10/13/2020 in [facility] Counselor from 08/30/2020 in [place] and Plan:   Oluwaseyi Raffel. Casanas is a 60 year old male with a past psychiatric history significant for bipolar disorder and generalized anxiety disorder who presents to [facility] via virtual video visit for follow-up and medication management.  Patient endorses depressive symptoms and anxiety and states that he has not noticed much success in the management of his symptoms.  Patient provided a list of prescriptions that he has taken in the past that were helpful.  Patient was recommended trazodone  50 mg at bedtime for the management of his sleep disturbances.  Patient was also recommended increasing his Latuda dosage from 20 mg to 40 mg daily for the management of his bipolar disorder.  Patient was also recommended sertraline 50 mg daily for the management of his depressive symptoms.  Patient was agreeable to recommendation.  Patient's medication to be e-prescribed to pharmacy of choice.  1. Bipolar I  disorder, most recent episode depressed (HCC)  - sertraline (ZOLOFT) 50 MG tablet; Take 1 tablet (50 mg total) by mouth daily.  Dispense: 30 tablet; Refill: 1 - lurasidone (LATUDA) 40 MG TABS tablet; Take 1 tablet (40 mg total) by mouth daily with breakfast.  Dispense: 30 tablet; Refill: 1  2. Generalized anxiety disorder  - sertraline (ZOLOFT) 50 MG tablet; Take 1 tablet (50 mg total) by mouth daily.  Dispense: 30 tablet; Refill: 1  3. Insomnia due to other mental disorder  - traZODone (DESYREL) 50 MG tablet; Take 1 tablet (50 mg total) by mouth at bedtime.  Dispense: 30 tablet; Refill: 1  4. Panic disorder  - sertraline (ZOLOFT) 50 MG tablet; Take 1 tablet (50 mg total) by mouth daily.  Dispense: 30 tablet; Refill: 1  Patient to follow up in 6 weeks Provider spent a total of 30 minutes with the patient/reviewing patient's chart  Christian Mood, PA 10/13/2020, 2:02 PM

## 2020-10-20 ENCOUNTER — Other Ambulatory Visit: Payer: Self-pay

## 2020-10-27 ENCOUNTER — Other Ambulatory Visit: Payer: Self-pay

## 2020-11-01 ENCOUNTER — Other Ambulatory Visit: Payer: Self-pay

## 2020-11-29 ENCOUNTER — Other Ambulatory Visit: Payer: Self-pay

## 2020-12-06 ENCOUNTER — Telehealth (INDEPENDENT_AMBULATORY_CARE_PROVIDER_SITE_OTHER): Payer: No Payment, Other | Admitting: Physician Assistant

## 2020-12-06 ENCOUNTER — Other Ambulatory Visit (HOSPITAL_COMMUNITY): Payer: Self-pay | Admitting: *Deleted

## 2020-12-06 ENCOUNTER — Encounter (HOSPITAL_COMMUNITY): Payer: Self-pay | Admitting: Physician Assistant

## 2020-12-06 ENCOUNTER — Other Ambulatory Visit: Payer: Self-pay

## 2020-12-06 DIAGNOSIS — F99 Mental disorder, not otherwise specified: Secondary | ICD-10-CM

## 2020-12-06 DIAGNOSIS — F3130 Bipolar disorder, current episode depressed, mild or moderate severity, unspecified: Secondary | ICD-10-CM | POA: Diagnosis not present

## 2020-12-06 DIAGNOSIS — F5105 Insomnia due to other mental disorder: Secondary | ICD-10-CM

## 2020-12-06 DIAGNOSIS — F411 Generalized anxiety disorder: Secondary | ICD-10-CM

## 2020-12-06 DIAGNOSIS — F410 Panic disorder [episodic paroxysmal anxiety] without agoraphobia: Secondary | ICD-10-CM

## 2020-12-06 MED ORDER — LURASIDONE HCL 60 MG PO TABS
60.0000 mg | ORAL_TABLET | Freq: Every day | ORAL | 1 refills | Status: DC
  Filled 2020-12-06: qty 30, 30d supply, fill #0
  Filled 2021-01-02: qty 30, 30d supply, fill #1

## 2020-12-06 MED ORDER — CLONAZEPAM 0.5 MG PO TABS: 0.2500 mg | ORAL_TABLET | Freq: Two times a day (BID) | ORAL | 0 refills | Status: DC

## 2020-12-06 MED ORDER — CLONAZEPAM 0.5 MG PO TABS: 0.5000 mg | ORAL_TABLET | Freq: Two times a day (BID) | ORAL | 0 refills | Status: DC

## 2020-12-06 MED ORDER — TRAZODONE HCL 50 MG PO TABS
25.0000 mg | ORAL_TABLET | Freq: Every evening | ORAL | 1 refills | Status: DC | PRN
  Filled 2020-12-06 – 2021-01-02 (×2): qty 15, 30d supply, fill #0

## 2020-12-06 MED ORDER — SERTRALINE HCL 50 MG PO TABS
50.0000 mg | ORAL_TABLET | Freq: Every day | ORAL | 1 refills | Status: DC
Start: 2020-12-06 — End: 2021-01-31
  Filled 2020-12-06 – 2021-01-02 (×2): qty 30, 30d supply, fill #0

## 2020-12-06 NOTE — Progress Notes (Signed)
[place] MD/PA/NP OP Progress Note  Virtual Visit via Video Note  I connected with Christian Guerra on 12/06/20 at 11:30 AM EDT by a video enabled telemedicine application and verified that I am speaking with the correct person using two identifiers.  Location: Patient: Home Provider: Clinic   I discussed the limitations of evaluation and management by telemedicine and the availability of in person appointments. The patient expressed understanding and agreed to proceed.  Follow Up Instructions:   I discussed the assessment and treatment plan with the patient. The patient was provided an opportunity to ask questions and all were answered. The patient agreed with the plan and demonstrated an understanding of the instructions.   The patient was advised to call back or seek an in-person evaluation if the symptoms worsen or if the condition fails to improve as anticipated.  I provided 30 minutes of non-face-to-face time during this encounter.   [name], PA   12/06/2020 6:11 PM Christian Guerra  MRN:  [id number]  Chief Complaint: Follow up and medication management  HPI:     Visit Diagnosis:    ICD-10-CM   1. Generalized anxiety disorder  F41.1 sertraline (ZOLOFT) 50 MG tablet    DISCONTINUED: clonazePAM (KLONOPIN) 0.5 MG tablet    DISCONTINUED: clonazePAM (KLONOPIN) 0.5 MG tablet    2. Panic disorder  F41.0 sertraline (ZOLOFT) 50 MG tablet    DISCONTINUED: clonazePAM (KLONOPIN) 0.5 MG tablet    DISCONTINUED: clonazePAM (KLONOPIN) 0.5 MG tablet    3. Bipolar I disorder, most recent episode depressed (HCC)  F31.30 sertraline (ZOLOFT) 50 MG tablet    Lurasidone HCl (LATUDA) 60 MG TABS    4. Insomnia due to other mental disorder  F51.05 traZODone (DESYREL) 50 MG tablet   F99       Past Psychiatric History:  Panic disorder Generalized anxiety disorder Insomnia Bipolar disorder  Past Medical History:  Past Medical History:  Diagnosis Date   Anxiety    Bilateral  swelling of feet and ankles    Bruised kidney 09/10/2016   Chronic lower back pain    Chronic pain    Daily headache    Diastolic dysfunction with chronic heart failure (HCC)    Fundic gland polyps of stomach, benign    GERD (gastroesophageal reflux disease)    Hepatitis C    "treated; got down to 0 load 6-8 years ago" (09/13/2016)   Hypertension    Memory difficulties    "in the last week or so" (09/13/2016)   Morbid obesity ([place])    Personal history of colonic adenoma 01/20/2013   Recurrent falls    "recently" (09/13/2016)   Sleep apnea    "wore mask at night before; couldn't tolerate it" (09/13/2016)   Wears glasses     Past Surgical History:  Procedure Laterality Date   ANKLE FRACTURE SURGERY Bilateral 2001   [place]   BIOPSY  11/11/2018   Procedure: BIOPSY;  Surgeon: Gatha Mayer, MD;  Location: [facility];  Service: Endoscopy;;   COLONOSCOPY     COLONOSCOPY WITH PROPOFOL N/A 11/11/2018   Procedure: COLONOSCOPY WITH PROPOFOL;  Surgeon: Gatha Mayer, MD;  Location: [facility];  Service: Endoscopy;  Laterality: N/A;   ESOPHAGOGASTRODUODENOSCOPY (EGD) WITH PROPOFOL N/A 11/11/2018   Procedure: ESOPHAGOGASTRODUODENOSCOPY (EGD) WITH PROPOFOL;  Surgeon: Gatha Mayer, MD;  Location: [facility];  Service: Endoscopy;  Laterality: N/A;   FOREARM SURGERY Left 2001   "chain saw injury"   POLYPECTOMY  11/11/2018  Procedure: POLYPECTOMY;  Surgeon: Gatha Mayer, MD;  Location: [facility];  Service: Endoscopy;;   TESTICLE TORSION REDUCTION  1980    Family Psychiatric History:  Patient denies a family history of psychiatric illness  Family History:  Family History  Problem Relation Age of Onset   Hypertension Father        old age--67   Other Father        enlarged prostate   Colon cancer Neg Hx     Social History:  Social History   Socioeconomic History   Marital status: Single    Spouse name: Not on file   Number of children: 0   Years of  education: Not on file   Highest education level: Not on file  Occupational History   Occupation: unemployeed  Tobacco Use   Smoking status: Never   Smokeless tobacco: Current    Types: Snuff  Vaping Use   Vaping Use: Never used  Substance and Sexual Activity   Alcohol use: Not Currently    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, not now, and non-smoker   Social Determinants of Radio broadcast assistant Strain: High Risk   Difficulty of Paying Living Expenses: Hard  Food Insecurity: Food Insecurity Present   Worried About Charity fundraiser in the Last Year: Sometimes true   Ran Out of Food in the Last Year: Sometimes true  Transportation Needs: No Transportation Needs   Lack of Transportation (Medical): No   Lack of Transportation (Non-Medical): No  Physical Activity: Inactive   Days of Exercise per Week: 0 days   Minutes of Exercise per Session: 0 min  Stress: Stress Concern Present   Feeling of Stress : Very much  Social Connections: Socially Isolated   Frequency of Communication with Friends and Family: More than three times a week   Frequency of Social Gatherings with Friends and Family: Never   Attends Religious Services: Never   Marine scientist or Organizations: No   Attends Archivist Meetings: Never   Marital Status: Never married    Allergies:  Allergies  Allergen Reactions   Codeine Itching    Metabolic Disorder Labs: Lab Results  Component Value Date   HGBA1C 5.8 (H) 10/14/2018   MPG 114 06/15/2016   MPG 108 09/23/2012    No results found for: PROLACTIN Lab Results  Component Value Date   CHOL 163 06/05/2018   TRIG 246 (H) 06/05/2018   HDL 37 (L) 06/05/2018   CHOLHDL 4.4 06/05/2018   VLDL 32 (H) 07/02/2016   LDLCALC 77 06/05/2018   LDLCALC 73 07/02/2016   Lab Results  Component Value Date   TSH 1.970 06/05/2018   TSH 1.395 09/14/2016    Therapeutic Level Labs: No results found for: LITHIUM No results found for: VALPROATE No components found for:  CBMZ  Current Medications: Current Outpatient Medications  Medication Sig Dispense Refill   albuterol (VENTOLIN HFA) 108 (90 Base) MCG/ACT inhaler Inhale 1 puff into the lungs every 6 (  six) hours as needed for wheezing or shortness of breath. 1 Inhaler 0   atenolol (TENORMIN) 50 MG tablet Take 1 tablet (50 mg total) by mouth daily. 30 tablet 2   clonazePAM (KLONOPIN) 0.5 MG tablet Take 0.5 tablets (0.25 mg total) by mouth 2 (two) times daily. 60 tablet 0   cyclobenzaprine (FLEXERIL) 10 MG tablet Take 10 mg by mouth 3 (three) times daily as needed for muscle spasms.     gabapentin (NEURONTIN) 600 MG tablet Take 600 mg by mouth 3 (three) times daily.     hydrochlorothiazide (HYDRODIURIL) 25 MG tablet Take 25 mg by mouth daily.     Lurasidone HCl (LATUDA) 60 MG TABS Take 1 tablet (60 mg total) by mouth daily with breakfast. 30 tablet 1   meloxicam (MOBIC) 15 MG tablet Take 15 mg by mouth daily.     minocycline (MINOCIN) 100 MG capsule Take 100 mg by mouth 3 (three) times daily.     nystatin cream (MYCOSTATIN) Apply 1 application topically 2 (two) times daily. 60 g 2   omeprazole (PRILOSEC) 20 MG capsule Take 1 capsule (20 mg total) by mouth daily. (Patient taking differently: Take 20 mg by mouth daily at 2 PM. ) 90 capsule 3   oxyCODONE (ROXICODONE) 15 MG immediate release tablet Take 15 mg by mouth every 8 (eight) hours.      polyethylene glycol (MIRALAX / GLYCOLAX) 17 g packet Take 17-34 g by mouth 2 (two) times daily as needed (constipation.).       senna-docusate (SENOKOT S) 8.6-50 MG tablet Take 1 tablet by mouth daily. (Patient taking differently: Take 1 tablet by mouth daily at 2 PM. ) 30 tablet 2   sertraline (ZOLOFT) 50 MG tablet Take 1 tablet (50 mg total) by mouth daily. 30 tablet 1   tamsulosin (FLOMAX) 0.4 MG CAPS capsule Take 1 capsule (0.4 mg total) by mouth daily at 2 PM. 30 capsule 6   traZODone (DESYREL) 50 MG tablet Take 0.5 tablets (25 mg total) by mouth at bedtime as needed for sleep. 15 tablet 1   No current facility-administered medications for this visit.     Musculoskeletal: Strength & Muscle Tone: Unable to assess due to telemedicine visit [place]: Unable to assess due to telemedicine visit Patient leans: Unable to assess due to telemedicine visit  Psychiatric Specialty Exam: Review of Systems  Psychiatric/Behavioral:  Positive for sleep disturbance. Negative for decreased concentration, dysphoric mood, hallucinations, self-injury and suicidal ideas. The patient is nervous/anxious. The patient is not hyperactive.    There were no vitals taken for this visit.There is no height or weight on file to calculate BMI.  General Appearance: Well Groomed  Eye Contact:  Good  Speech:  Clear and Coherent and Normal Rate  Volume:  Normal  Mood:  Anxious, Depressed, and Irritable  Affect:  Congruent and Depressed  Thought Process:  Coherent, Goal Directed, and Descriptions of Associations: Intact  Orientation:  Full (Time, Place, and Person)  Thought Content: WDL   Suicidal Thoughts:  No  Homicidal Thoughts:  No  Memory:  Immediate;   Good Recent;   Good Remote;   Fair  Judgement:  Good  Insight:  Fair  Psychomotor Activity:  Restlessness  Concentration:  Concentration: Good and Attention Span: Good  Recall:  Good  Fund of Knowledge: Fair  Language: Good  Akathisia:  NA  Handed:  Right  AIMS (if indicated): not done  Assets:  Communication Skills Desire for [place]  ADL's:   Impaired  Cognition: WNL  Sleep:  Fair   Screenings: GAD-7    Flowsheet Row Video Visit from 12/06/2020 in [facility] Video Visit from 10/13/2020 in [facility] Video Visit from 08/30/2020 in [facility] Office Visit from 10/09/2018 in [place] Office Visit from 12/07/2016 in [place]  Total GAD-7 Score 20 19 21 21  0      PHQ2-9    Flowsheet Row Video Visit from 12/06/2020 in [facility] Video Visit from 10/13/2020 in [facility] Counselor from 08/30/2020 in [facility] Office Visit from 10/09/2018 in [place] [facility] from 09/17/2018 [facility]  PHQ-2 Total Score 4 4 3 1 4   PHQ-9 Total Score 16 19 10  -- 7      Flowsheet Row Video Visit from 12/06/2020 in [facility] Video Visit from 10/13/2020 in [facility] Counselor from 08/30/2020 in [place] No Risk Low Risk Low Risk        Assessment and Plan:     1. Generalized anxiety disorder Patient was placed on Klonopin 0.5 mg 2 times daily as needed for the management of his generalized anxiety disorder  - sertraline (ZOLOFT) 50 MG tablet; Take 1 tablet (50 mg total) by mouth daily.  Dispense: 30 tablet; Refill: 1  2. Panic disorder Patient was placed on Klonopin 0.5 mg 2 times daily as needed for the management of his panic disorder  - sertraline (ZOLOFT) 50 MG tablet; Take 1 tablet (50 mg total) by mouth daily.  Dispense: 30 tablet; Refill: 1  3. Bipolar I disorder, most recent episode depressed (HCC)  - sertraline (ZOLOFT) 50 MG tablet; Take 1 tablet (50 mg total) by mouth daily.  Dispense: 30 tablet; Refill: 1 - Lurasidone  HCl (LATUDA) 60 MG TABS; Take 1 tablet (60 mg total) by mouth daily with breakfast.  Dispense: 30 tablet; Refill: 1  4. Insomnia due to other mental disorder  - traZODone (DESYREL) 50 MG tablet; Take 0.5 tablets (25 mg total) by mouth at bedtime as needed for sleep.  Dispense: 15 tablet; Refill: 1  Patient to follow up in 6 weeks Provider spent a total of 25 minutes with the patient/reviewing patient's chart   [name], PA 12/06/2020, 6:11 PM

## 2020-12-07 ENCOUNTER — Other Ambulatory Visit: Payer: Self-pay

## 2021-01-02 ENCOUNTER — Other Ambulatory Visit: Payer: Self-pay

## 2021-01-17 ENCOUNTER — Other Ambulatory Visit: Payer: Self-pay

## 2021-01-31 ENCOUNTER — Telehealth (INDEPENDENT_AMBULATORY_CARE_PROVIDER_SITE_OTHER): Payer: No Payment, Other | Admitting: Physician Assistant

## 2021-01-31 ENCOUNTER — Encounter (HOSPITAL_COMMUNITY): Payer: Self-pay | Admitting: Physician Assistant

## 2021-01-31 DIAGNOSIS — F411 Generalized anxiety disorder: Secondary | ICD-10-CM

## 2021-01-31 DIAGNOSIS — F3130 Bipolar disorder, current episode depressed, mild or moderate severity, unspecified: Secondary | ICD-10-CM

## 2021-01-31 DIAGNOSIS — F5105 Insomnia due to other mental disorder: Secondary | ICD-10-CM

## 2021-01-31 DIAGNOSIS — F410 Panic disorder [episodic paroxysmal anxiety] without agoraphobia: Secondary | ICD-10-CM

## 2021-01-31 DIAGNOSIS — F99 Mental disorder, not otherwise specified: Secondary | ICD-10-CM

## 2021-01-31 MED ORDER — TRAZODONE HCL 50 MG PO TABS
25.0000 mg | ORAL_TABLET | Freq: Every evening | ORAL | 1 refills | Status: DC | PRN
  Filled 2021-01-31: qty 15, 30d supply, fill #0

## 2021-01-31 MED ORDER — SERTRALINE HCL 50 MG PO TABS
50.0000 mg | ORAL_TABLET | Freq: Every day | ORAL | 1 refills | Status: DC
  Filled 2021-01-31: qty 30, 30d supply, fill #0

## 2021-01-31 NOTE — Progress Notes (Signed)
[facility] MD/PA/NP OP Progress Note  Virtual Visit via Video Note  I connected with Christian Guerra on 01/31/21 at  9:30 AM EDT by a video enabled telemedicine application and verified that I am speaking with the correct person using two identifiers.  Location: Patient: Home Provider: Clinic   I discussed the limitations of evaluation and management by telemedicine and the availability of in person appointments. The patient expressed understanding and agreed to proceed.  Follow Up Instructions:  I discussed the assessment and treatment plan with the patient. The patient was provided an opportunity to ask questions and all were answered. The patient agreed with the plan and demonstrated an understanding of the instructions.   The patient was advised to call back or seek an in-person evaluation if the symptoms worsen or if the condition fails to improve as anticipated.  I provided 16 minutes of non-face-to-face time during this encounter.  [name], PA    01/31/2021 7:57 PM Christian Guerra  MRN:  [id number]  Chief Complaint: Follow up and medication management  HPI:   Christian Guerra is a 60 year old male with a past psychiatric history significant for insomnia, bipolar disorder, generalized anxiety disorder, and panic disorder who presents to [facility] via virtual video visit for follow-up and medication management.  Patient is currently being managed on the following medications:  Klonopin 0.5 mg 3 times daily as needed Zoloft 50 mg daily Lurasidone 60 mg daily Trazodone 25 mg at bedtime  Patient reports  he recently discovered that he is allergic to Klonopin.  Patient states that whenever he took his Klonopin, he would start to break out in hives on his arms bilaterally.  Patient states that he still experienced the benefits of manageable anxiety and limited panic attacks when taking his Klonopin.  Patient is interested in trying  other options for managing his anxiety and panic attacks.  Patient also reports that he has been having some issues with affording Latuda.  Patient states that he is currently running low on his current prescription and only has a limited supply of his previous prescription but at a lower dose.  Patient reports that he recently received disability and will still have Medicaid but has not gotten any word on next steps.  Patient rates his anxiety a 10 out of 10.  As a result of his elevated anxiety, patient states that his muscles are constantly tense.  He reports that his muscles are so tense he feels sore all over.  He reports that his depression has not been bad as of late.  Providers discussed with patient possible options for receiving aid in being able to afford his Latuda.  Patient was also informed that provider would look into other options for managing his elevated anxiety and panic attacks.  A GAD-7 screen was performed with the patient scoring a 20.  Patient is alert and oriented x4, pleasant, calm, cooperative, and fully engaged in conversation during the encounter.  Patient endorses mood swings often but states that he is in a decent mood today.  Patient denies suicidal or homicidal ideations.  He further denies auditory or visual hallucinations and does not appear to be responding to internal/external stimuli.  Patient endorses good sleep and receives on average 7 to 8 hours of sleep each night.  Patient states that his sleep was recently disturbed due to his stepmother taking his prescription of trazodone.  Patient endorses good appetite and eats on average 2 meals  per day.  Patient denies alcohol consumption and illicit drug use.  He endorses tobacco use stating that he uses snuff regularly every 2 hours.  Visit Diagnosis:    ICD-10-CM   1. Insomnia due to other mental disorder  F51.05 traZODone (DESYREL) 50 MG tablet   F99     2. Bipolar I disorder, most recent episode depressed (HCC)   F31.30 sertraline (ZOLOFT) 50 MG tablet    3. Generalized anxiety disorder  F41.1 sertraline (ZOLOFT) 50 MG tablet    4. Panic disorder  F41.0 sertraline (ZOLOFT) 50 MG tablet      Past Psychiatric History:  Panic disorder Generalized anxiety disorder Insomnia Bipolar disorder  Past Medical History:  Past Medical History:  Diagnosis Date   Anxiety    Bilateral swelling of feet and ankles    Bruised kidney 09/10/2016   Chronic lower back pain    Chronic pain    Daily headache    Diastolic dysfunction with chronic heart failure (HCC)    Fundic gland polyps of stomach, benign    GERD (gastroesophageal reflux disease)    Hepatitis C    "treated; got down to 0 load 6-8 years ago" (09/13/2016)   Hypertension    Memory difficulties    "in the last week or so" (09/13/2016)   Morbid obesity ([place])    Personal history of colonic adenoma 01/20/2013   Recurrent falls    "recently" (09/13/2016)   Sleep apnea    "wore mask at night before; couldn't tolerate it" (09/13/2016)   Wears glasses     Past Surgical History:  Procedure Laterality Date   ANKLE FRACTURE SURGERY Bilateral 2001   [place]   BIOPSY  11/11/2018   Procedure: BIOPSY;  Surgeon: Gatha Mayer, MD;  Location: [facility];  Service: Endoscopy;;   COLONOSCOPY     COLONOSCOPY WITH PROPOFOL N/A 11/11/2018   Procedure: COLONOSCOPY WITH PROPOFOL;  Surgeon: Gatha Mayer, MD;  Location: [facility];  Service: Endoscopy;  Laterality: N/A;   ESOPHAGOGASTRODUODENOSCOPY (EGD) WITH PROPOFOL N/A 11/11/2018   Procedure: ESOPHAGOGASTRODUODENOSCOPY (EGD) WITH PROPOFOL;  Surgeon: Gatha Mayer, MD;  Location: [facility];  Service: Endoscopy;  Laterality: N/A;   FOREARM SURGERY Left 2001   "chain saw injury"   POLYPECTOMY  11/11/2018   Procedure: POLYPECTOMY;  Surgeon: Gatha Mayer, MD;  Location: [facility];  Service: Endoscopy;;   TESTICLE TORSION REDUCTION  1980    Family Psychiatric History:  Patient denies  a family history of psychiatric illness  Family History:  Family History  Problem Relation Age of Onset   Hypertension Father        old age--21   Other Father        enlarged prostate   Colon cancer Neg Hx     Social History:  Social History   Socioeconomic History   Marital status: Single    Spouse name: Not on file   Number of children: 0   Years of education: Not on file   Highest education level: Not on file  Occupational History   Occupation: unemployeed  Tobacco Use   Smoking status: Never   Smokeless tobacco: Current    Types: Snuff  Vaping Use   Vaping Use: Never used  Substance and Sexual Activity   Alcohol use: Not Currently    Comment: 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, not now, and non-smoker   Social Determinants of Radio broadcast assistant Strain: High Risk   Difficulty of Paying Living Expenses: Hard  Food Insecurity: Food Insecurity Present   Worried About Charity fundraiser in the Last Year: Sometimes true   Ran Out of Food in the Last Year: Sometimes true  Transportation Needs: No Transportation Needs   Lack of Transportation (Medical): No   Lack of Transportation (Non-Medical): No  Physical Activity: Inactive   Days of Exercise per Week: 0 days   Minutes of Exercise per Session: 0 min  Stress: Stress Concern Present   Feeling of Stress : Very much  Social Connections: Socially Isolated   Frequency of Communication with Friends and Family: More than three times a week   Frequency of Social Gatherings with Friends and Family: Never    Attends Religious Services: Never   Marine scientist or Organizations: No   Attends Archivist Meetings: Never   Marital Status: Never married    Allergies:  Allergies  Allergen Reactions   Codeine Itching    Metabolic Disorder Labs: Lab Results  Component Value Date   HGBA1C 5.8 (H) 10/14/2018   MPG 114 06/15/2016   MPG 108 09/23/2012   No results found for: PROLACTIN Lab Results  Component Value Date   CHOL 163 06/05/2018   TRIG 246 (H) 06/05/2018   HDL 37 (L) 06/05/2018   CHOLHDL 4.4 06/05/2018   VLDL 32 (H) 07/02/2016   LDLCALC 77 06/05/2018   LDLCALC 73 07/02/2016   Lab Results  Component Value Date   TSH 1.970 06/05/2018   TSH 1.395 09/14/2016    Therapeutic Level Labs: No results found for: LITHIUM No results found for: VALPROATE No components found for:  CBMZ  Current Medications: Current Outpatient Medications  Medication Sig Dispense Refill   albuterol (VENTOLIN HFA) 108 (90 Base) MCG/ACT inhaler Inhale 1 puff into the lungs every 6 (six) hours as needed for wheezing or shortness of breath. 1 Inhaler 0   atenolol (TENORMIN) 50 MG tablet Take 1 tablet (50 mg total) by mouth daily. 30 tablet 2   clonazePAM (KLONOPIN) 0.5 MG tablet Take 0.5 tablets (0.25 mg total) by mouth 2 (two) times daily. 60 tablet 0   cyclobenzaprine (FLEXERIL) 10 MG tablet Take 10 mg by mouth 3 (three) times daily as needed for muscle spasms.     gabapentin (NEURONTIN) 600 MG tablet Take 600 mg by mouth 3 (three) times daily.     hydrochlorothiazide (HYDRODIURIL) 25 MG tablet Take 25 mg by mouth daily.     Lurasidone HCl (LATUDA) 60 MG TABS Take 1 tablet (60 mg total) by mouth daily with breakfast. 30 tablet 1   meloxicam (MOBIC) 15 MG tablet Take 15 mg by mouth daily.     minocycline (MINOCIN) 100 MG capsule Take 100 mg by mouth 3 (three) times daily.     nystatin cream (MYCOSTATIN) Apply 1 application topically 2 (two) times daily. 60 g 2   omeprazole (PRILOSEC) 20  MG capsule Take 1 capsule (20 mg total) by mouth daily. (Patient taking differently: Take 20 mg by mouth daily  at 2 PM. ) 90 capsule 3   oxyCODONE (ROXICODONE) 15 MG immediate release tablet Take 15 mg by mouth every 8 (eight) hours.      polyethylene glycol (MIRALAX / GLYCOLAX) 17 g packet Take 17-34 g by mouth 2 (two) times daily as needed (constipation.).      senna-docusate (SENOKOT S) 8.6-50 MG tablet Take 1 tablet by mouth daily. (Patient taking differently: Take 1 tablet by mouth daily at 2 PM. ) 30 tablet 2   sertraline (ZOLOFT) 50 MG tablet Take 1 tablet (50 mg total) by mouth daily. 30 tablet 1   tamsulosin (FLOMAX) 0.4 MG CAPS capsule Take 1 capsule (0.4 mg total) by mouth daily at 2 PM. 30 capsule 6   traZODone (DESYREL) 50 MG tablet Take 0.5 tablets (25 mg total) by mouth at bedtime as needed for sleep. 15 tablet 1   No current facility-administered medications for this visit.     Musculoskeletal: Strength & Muscle Tone: Unable to assess due telemedicine visit Gait & Station: Unable to assess due telemedicine visit Patient leans: Unable to assess due telemedicine visit  Psychiatric Specialty Exam: Review of Systems  Psychiatric/Behavioral:  Positive for sleep disturbance. Negative for decreased concentration, dysphoric mood, hallucinations, self-injury and suicidal ideas. The patient is nervous/anxious. The patient is not hyperactive.    There were no vitals taken for this visit.There is no height or weight on file to calculate BMI.  General Appearance: Well Groomed  Eye Contact:  Good  Speech:  Clear and Coherent and Normal Rate  Volume:  Normal  Mood:  Anxious  Affect:  Appropriate and Congruent  Thought Process:  Coherent, Goal Directed, and Descriptions of Associations: Intact  Orientation:  Full (Time, Place, and Person)  Thought Content: WDL   Suicidal Thoughts:  No  Homicidal Thoughts:  No  Memory:  Immediate;   Good Recent;   Good Remote;   Fair  Judgement:   Good  Insight:  Fair  Psychomotor Activity:  Restlessness  Concentration:  Concentration: Good and Attention Span: Good  Recall:  Good  Fund of Knowledge: Fair  Language: Good  Akathisia:  NA  Handed:  Right  AIMS (if indicated): not done  Assets:  Communication Skills Desire for Improvement Housing Social Support  ADL's:  Impaired  Cognition: WNL  Sleep:  Fair   Screenings: GAD-7    Flowsheet Row Video Visit from 01/31/2021 in [facility] Video Visit from 12/06/2020 in [facility] Video Visit from 10/13/2020 in [facility] Video Visit from 08/30/2020 in [facility] Office Visit from 10/09/2018 in [place]  Total GAD-7 Score 20 20 19 21 21       PHQ2-9    Flowsheet Row Video Visit from 01/31/2021 in [facility] Video Visit from 12/06/2020 in [facility] Video Visit from 10/13/2020 in [facility] from 08/30/2020 in [facility] Office Visit from 10/09/2018 in [facility]  PHQ-2 Total Score 1 4 4 3 1   PHQ-9 Total Score -- 16 19 10  --      Flowsheet Row Video Visit from 01/31/2021 in [facility] Video Visit from 12/06/2020 in [facility] Video Visit from 10/13/2020 in [place] No Risk No Risk Low Risk  Assessment and Plan:   Christian Guerra is a 60 year old male with a past psychiatric history significant for insomnia, bipolar disorder, generalized anxiety disorder, and panic disorder who presents to [facility] via virtual video visit for follow-up and medication management.  Patient reports experiencing worsening anxiety and panic  attacks due to not being able to take his Klonopin discovering he was allergic to the medication.  Patient reports that his depression has not been bad as of late, however, he does experience mood swings daily.  Patient reports that he has not been able to afford his Latuda but still has a limited supply of his current prescription as well as a lower dose.  Provider gave a number for central financial assistance for the Latuda medication to patient.  Provider informed patient to continue taking his Latuda.  Provider would look into other options for managing patient's anxiety and panic attacks.  Patient's medications to be e-prescribed to pharmacy of choice.  1. Insomnia due to other mental disorder  - traZODone (DESYREL) 50 MG tablet; Take 0.5 tablets (25 mg total) by mouth at bedtime as needed for sleep.  Dispense: 15 tablet; Refill: 1  2. Bipolar I disorder, most recent episode depressed (HCC)  - sertraline (ZOLOFT) 50 MG tablet; Take 1 tablet (50 mg total) by mouth daily.  Dispense: 30 tablet; Refill: 1  3. Generalized anxiety disorder  - sertraline (ZOLOFT) 50 MG tablet; Take 1 tablet (50 mg total) by mouth daily.  Dispense: 30 tablet; Refill: 1  4. Panic disorder  - sertraline (ZOLOFT) 50 MG tablet; Take 1 tablet (50 mg total) by mouth daily.  Dispense: 30 tablet; Refill: 1  Patient to follow up in 2 months Provider spent a total of 16 minutes with the patient/reviewing the patient's chart  [name], PA 01/31/2021, 7:57 PM

## 2021-02-01 ENCOUNTER — Other Ambulatory Visit: Payer: Self-pay

## 2021-02-02 ENCOUNTER — Other Ambulatory Visit: Payer: Self-pay

## 2021-02-08 ENCOUNTER — Other Ambulatory Visit: Payer: Self-pay

## 2021-03-03 LAB — HEPATIC FUNCTION PANEL
ALT: 75 — AB (ref 10–40)
AST: 45 — AB (ref 14–40)
Alkaline Phosphatase: 424 — AB (ref 25–125)
Bilirubin, Direct: 0.45 — AB (ref 0.01–0.4)
Bilirubin, Total: 0.9

## 2021-03-03 LAB — CBC: RBC: 5.05 (ref 3.87–5.11)

## 2021-03-03 LAB — BASIC METABOLIC PANEL
BUN: 19 (ref 4–21)
CO2: 28 — AB (ref 13–22)
Chloride: 86 — AB (ref 99–108)
Creatinine: 1 (ref 0.6–1.3)
Glucose: 114
Potassium: 3.2 — AB (ref 3.4–5.3)
Sodium: 133 — AB (ref 137–147)

## 2021-03-03 LAB — LIPID PANEL
Cholesterol: 88 (ref 0–200)
HDL: 16 — AB (ref 35–70)
LDL Cholesterol: 46
LDl/HDL Ratio: 2.9
Triglycerides: 149 (ref 40–160)

## 2021-03-03 LAB — COMPREHENSIVE METABOLIC PANEL
Albumin: 2.9 — AB (ref 3.5–5.0)
GFR calc non Af Amer: 83

## 2021-03-03 LAB — HEMOGLOBIN A1C: Hemoglobin A1C: 5.5

## 2021-03-03 LAB — CBC AND DIFFERENTIAL
HCT: 40 — AB (ref 41–53)
Hemoglobin: 13 — AB (ref 13.5–17.5)
Platelets: 312 (ref 150–399)
WBC: 14.3

## 2021-03-03 LAB — TSH: TSH: 0.2 — AB (ref 0.41–5.90)

## 2021-03-23 ENCOUNTER — Telehealth: Payer: Self-pay | Admitting: Internal Medicine

## 2021-03-23 NOTE — Telephone Encounter (Signed)
Patient called stating that he had Labs done and that his Doctor Dr. Jimmye Norman wanted him to call and inform us about the labs. If you could please advise.

## 2021-03-24 NOTE — Telephone Encounter (Signed)
Pt stated that he recently had labs done that were ordered by Dr. Jimmye Norman and wanted Dr. Carlean Purl to see the results. Pt was notified to please contact Dr. Jimmye Norman office and please have them fax the labs results to our office. Fax number provided: [phone] Pt verbalized understanding with all questions answered.

## 2021-04-02 ENCOUNTER — Telehealth: Payer: Self-pay | Admitting: Internal Medicine

## 2021-04-02 NOTE — Telephone Encounter (Signed)
Labs patient had sent show very elevated alk phos and abnormal transaminases  Alk phos 424  Fatty liver on MR abdomen 2020  Needs OV w/ us next avail 

## 2021-04-03 ENCOUNTER — Telehealth: Payer: Self-pay

## 2021-04-03 NOTE — Telephone Encounter (Signed)
Pt appointment that was scheduled on 04/06/2021  with Ellouise Newer PA was rescheduled to 04/11/2021 at 1:30 with Ellouise Newer PA  due to an over scheduling.  Left message for pt to call back to make aware

## 2021-04-03 NOTE — Telephone Encounter (Signed)
Pt made aware of results and Dr. Carlean Purl recommendations. Pt scheduled to see Arville Go PA on 04/06/2021 @ 11:50: Pt made aware. Address  Provided. Pt verbalized understanding with all questions answered

## 2021-04-04 ENCOUNTER — Telehealth (INDEPENDENT_AMBULATORY_CARE_PROVIDER_SITE_OTHER): Payer: No Payment, Other | Admitting: Physician Assistant

## 2021-04-04 DIAGNOSIS — F410 Panic disorder [episodic paroxysmal anxiety] without agoraphobia: Secondary | ICD-10-CM | POA: Diagnosis not present

## 2021-04-04 DIAGNOSIS — F3130 Bipolar disorder, current episode depressed, mild or moderate severity, unspecified: Secondary | ICD-10-CM | POA: Diagnosis not present

## 2021-04-04 DIAGNOSIS — F411 Generalized anxiety disorder: Secondary | ICD-10-CM | POA: Diagnosis not present

## 2021-04-04 MED ORDER — SERTRALINE HCL 50 MG PO TABS
50.0000 mg | ORAL_TABLET | Freq: Every day | ORAL | 1 refills | Status: DC
Start: 2021-04-04 — End: 2021-07-18
  Filled 2021-04-04: qty 30, 30d supply, fill #0

## 2021-04-04 NOTE — Telephone Encounter (Signed)
Pt made aware of the Charge in the appointment and that the New appointment is scheduled for 04/11/2021 @ 1:30 with Ellouise Newer.  Pt verbalized understanding with all questions answered

## 2021-04-04 NOTE — Progress Notes (Signed)
BH MD/PA/NP OP Progress Note  Virtual Visit via Video Note  I connected with Christian Guerra on 04/04/21 at 11:30 AM EST by a video enabled telemedicine application and verified that I am speaking with the correct person using two identifiers.  Location: Patient: Home Provider: Clinic   I discussed the limitations of evaluation and management by telemedicine and the availability of in person appointments. The patient expressed understanding and agreed to proceed.  Follow Up Instructions:  I discussed the assessment and treatment plan with the patient. The patient was provided an opportunity to ask questions and all were answered. The patient agreed with the plan and demonstrated an understanding of the instructions.   The patient was advised to call back or seek an in-person evaluation if the symptoms worsen or if the condition fails to improve as anticipated.  I provided 21 minutes of non-face-to-face time during this encounter.  Christian Mood, PA    04/04/2021 7:51 PM Javanni Maring  MRN:  [id number]  Chief Complaint: Follow up and medication management  HPI:   Christian Guerra. Narang is a 60 year old male with a past psychiatric history significant for insomnia, bipolar disorder, generalized anxiety disorder, and panic disorder who presents to [facility] via virtual video visit for follow-up and medication management.  Patient is currently being managed on the following medications:  Trazodone 50 mg (half tablet) at bedtime as needed Sertraline 50 mg daily  Patient reports that there was a period of time where he was not taking his medications due to being unable to afford them.  Patient states that he was recently contacted by [place] to fill out forms so that he could purchase his Latuda at a discounted rate.  Patient was unable to fill the forms because his dog chewed up the copies.  Provider  informed patient that [facility] would be contacted to make them aware that the patient still needs to fill out his forms for patient assistance.  Patient states that his Guerra has been up and down as of late.  Patient states that he has been approved for Medicare but has not received any updates in his status.  Although patient endorses fluctuating Guerra he reports no bad thoughts and states that he does not want to kill himself.  He reports that he would like to go back on Zoloft and would like his dosage increased.  He reports that his anxiety has been much more manageable which she attributes to prayer and fasting.  He states since fasting, he has noticed less fidgeting and less of biting of his fingernails.  A PHQ-9 screen was performed with the patient scoring a 12.  A GAD-7 screen was also performed with the patient scoring a 15.  Patient is alert and oriented x4, pleasant, calm, cooperative, and fully engaged in conversation during the encounter.  Patient states that his Guerra has been pretty good as of late.  Patient denies suicidal or homicidal ideations.  He further denies auditory or visual hallucinations and does not appear to be responding to internal/external stimuli.  Patient states that he has been able to sleep on his own as of late and does not rely too heavily on trazodone anymore.  Patient reports receiving on average 8 hours of sleep each night.  Patient states that he usually eats 3 meals per day but has been fasting more periodically.  Patient denies alcohol consumption and  illicit drug use.  Patient states that he does engage in tobacco use as well as dipping.  Visit Diagnosis:    ICD-10-CM   1. Bipolar I disorder, most recent episode depressed (HCC)  F31.30 sertraline (ZOLOFT) 50 MG tablet    2. Generalized anxiety disorder  F41.1 sertraline (ZOLOFT) 50 MG tablet    3. Panic disorder  F41.0 sertraline (ZOLOFT) 50 MG tablet      Past Psychiatric  History:  Panic disorder Generalized anxiety disorder Insomnia Bipolar disorder  Past Medical History:  Past Medical History:  Diagnosis Date   Anxiety    Bilateral swelling of feet and ankles    Bruised kidney 09/10/2016   Chronic lower back pain    Chronic pain    Daily headache    Diastolic dysfunction with chronic heart failure (HCC)    Fundic gland polyps of stomach, benign    GERD (gastroesophageal reflux disease)    Hepatitis C    "treated; got down to 0 load 6-8 years ago" (09/13/2016)   Hypertension    Memory difficulties    "in the last week or so" (09/13/2016)   Morbid obesity ([place])    Personal history of colonic adenoma 01/20/2013   Recurrent falls    "recently" (09/13/2016)   Sleep apnea    "wore mask at night before; couldn't tolerate it" (09/13/2016)   Wears glasses     Past Surgical History:  Procedure Laterality Date   ANKLE FRACTURE SURGERY Bilateral 2001   [place]   BIOPSY  11/11/2018   Procedure: BIOPSY;  Surgeon: Gatha Mayer, MD;  Location: [facility];  Service: Endoscopy;;   COLONOSCOPY     COLONOSCOPY WITH PROPOFOL N/A 11/11/2018   Procedure: COLONOSCOPY WITH PROPOFOL;  Surgeon: Gatha Mayer, MD;  [facility]: [facility];  Service: Endoscopy;  Laterality: N/A;   ESOPHAGOGASTRODUODENOSCOPY (EGD) WITH PROPOFOL N/A 11/11/2018   Procedure: ESOPHAGOGASTRODUODENOSCOPY (EGD) WITH PROPOFOL;  Surgeon: Gatha Mayer, MD;  [facility];  Service: Endoscopy;  Laterality: N/A;   FOREARM SURGERY Left 2001   "chain saw injury"   POLYPECTOMY  11/11/2018   Procedure: POLYPECTOMY;  Surgeon: Gatha Mayer, MD;  [facility];  Service: Endoscopy;;   TESTICLE TORSION REDUCTION  1980    Family Psychiatric History:  Patient denies a family history of psychiatric illness  Family History:  Family History  Problem Relation Age of Onset   Hypertension Father        old age--37   Other Father        enlarged prostate   Colon  cancer Neg Hx     Social History:  Social History   Socioeconomic History   Marital status: Single    Spouse name: Not on file   Number of children: 0   Years of education: Not on file   Highest education level: Not on file  Occupational History   Occupation: unemployeed  Tobacco Use   Smoking status: Never   Smokeless tobacco: Current    Types: Snuff  Vaping Use   Vaping Use: Never used  Substance and Sexual Activity   Alcohol use: Not Currently    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, not now, and non-smoker   Social Determinants of Radio broadcast assistant Strain: High Risk   Difficulty of Paying Living Expenses: Hard  Food Insecurity: Food Insecurity Present   Worried About Charity fundraiser in the Last Year: Sometimes true   Ran Out of Food in the Last Year: Sometimes true  Transportation Needs: No Transportation Needs   Lack of Transportation (Medical): No   Lack of Transportation (Non-Medical): No  Physical Activity: Inactive   Days of Exercise per Week: 0 days   Minutes of Exercise per Session: 0 min  Stress: Stress Concern Present   Feeling of Stress : Very much  Social Connections: Socially Isolated   Frequency of Communication with Friends and Family: More than three times a week   Frequency of Social Gatherings with Friends and Family: Never   Attends Religious Services: Never   Marine scientist or Organizations: No   Attends Archivist Meetings: Never   Marital Status: Never married    Allergies:  Allergies  Allergen  Reactions   Codeine Itching    Metabolic Disorder Labs: Lab Results  Component Value Date   HGBA1C 5.8 (H) 10/14/2018   MPG 114 06/15/2016   MPG 108 09/23/2012   No results found for: PROLACTIN Lab Results  Component Value Date   CHOL 163 06/05/2018   TRIG 246 (H) 06/05/2018   HDL 37 (L) 06/05/2018   CHOLHDL 4.4 06/05/2018   VLDL 32 (H) 07/02/2016   LDLCALC 77 06/05/2018   LDLCALC 73 07/02/2016   Lab Results  Component Value Date   TSH 1.970 06/05/2018   TSH 1.395 09/14/2016    Therapeutic Level Labs: No results found for: LITHIUM No results found for: VALPROATE No components found for:  CBMZ  Current Medications: Current Outpatient Medications  Medication Sig Dispense Refill   albuterol (VENTOLIN HFA) 108 (90 Base) MCG/ACT inhaler Inhale 1 puff into the lungs every 6 (six) hours as needed for wheezing or shortness of breath. 1 Inhaler 0   atenolol (TENORMIN) 50 MG tablet Take 1 tablet (50 mg total) by mouth daily. 30 tablet 2   clonazePAM (KLONOPIN) 0.5 MG tablet Take 0.5 tablets (0.25 mg total) by mouth 2 (two) times daily. 60 tablet 0   cyclobenzaprine (FLEXERIL) 10 MG tablet Take 10 mg by mouth 3 (three) times daily as needed for muscle spasms.     gabapentin (NEURONTIN) 600 MG tablet Take 600 mg by mouth 3 (three) times daily.     hydrochlorothiazide (HYDRODIURIL) 25 MG tablet Take 25 mg by mouth daily.     Lurasidone HCl (LATUDA) 60 MG TABS Take 1 tablet (60 mg total) by mouth daily with breakfast. 30 tablet 1   meloxicam (MOBIC) 15 MG tablet Take 15 mg by mouth daily.     minocycline (MINOCIN) 100 MG capsule Take 100 mg by mouth 3 (three) times daily.     nystatin cream (MYCOSTATIN) Apply 1 application topically 2 (two) times daily. 60 g 2   omeprazole (PRILOSEC) 20 MG capsule Take 1 capsule (20 mg total) by mouth daily. (Patient taking differently: Take 20 mg by mouth daily at 2 PM. ) 90 capsule 3   oxyCODONE (ROXICODONE) 15 MG immediate release tablet Take 15  mg by mouth every 8 (eight) hours.  polyethylene glycol (MIRALAX / GLYCOLAX) 17 g packet Take 17-34 g by mouth 2 (two) times daily as needed (constipation.).      senna-docusate (SENOKOT S) 8.6-50 MG tablet Take 1 tablet by mouth daily. (Patient taking differently: Take 1 tablet by mouth daily at 2 PM. ) 30 tablet 2   sertraline (ZOLOFT) 50 MG tablet Take 1 tablet (50 mg total) by mouth daily. 30 tablet 1   tamsulosin (FLOMAX) 0.4 MG CAPS capsule Take 1 capsule (0.4 mg total) by mouth daily at 2 PM. 30 capsule 6   traZODone (DESYREL) 50 MG tablet Take 0.5 tablets (25 mg total) by mouth at bedtime as needed for sleep. 15 tablet 1   No current facility-administered medications for this visit.     Musculoskeletal: Strength & Muscle Tone: Unable to assess due to telemedicine visit [place]: Unable to assess due to telemedicine visit Patient leans: Unable to assess due to telemedicine visit  Psychiatric Specialty Exam: Review of Systems  Psychiatric/Behavioral:  Negative for decreased concentration, dysphoric Guerra, hallucinations, self-injury, sleep disturbance and suicidal ideas. The patient is nervous/anxious. The patient is not hyperactive.    There were no vitals taken for this visit.There is no height or weight on file to calculate BMI.  General Appearance: Casual  Eye Contact:  Good  Speech:  Clear and Coherent and Normal Rate  Volume:  Normal  Guerra:  Anxious and Depressed  Affect:  Congruent  Thought Process:  Coherent, Goal Directed, and Descriptions of Associations: Intact  Orientation:  Full (Time, Place, and Person)  Thought Content: WDL   Suicidal Thoughts:  No  Homicidal Thoughts:  No  Memory:  Immediate;   Good Recent;   Good Remote;   Fair  Judgement:  Good  Insight:  Fair  Psychomotor Activity:  Normal  Concentration:  Concentration: Good and Attention Span: Good  Recall:  Good  Fund of Knowledge: Fair  Language: Good  Akathisia:  NA  Handed:  Right   AIMS (if indicated): not done  Assets:  Communication Skills Desire for Improvement Housing Social Support  ADL's:  Impaired  Cognition: WNL  Sleep:  Fair   Screenings: GAD-7    Flowsheet Row Video Visit from 04/04/2021 in [facility] Video Visit from 01/31/2021 in [facility] Video Visit from 12/06/2020 in [facility] Video Visit from 10/13/2020 in [facility] Video Visit from 08/30/2020 in [facility]  Total GAD-7 Score 15 20 20 19 21       PHQ2-9    Flowsheet Row Video Visit from 04/04/2021 in [facility] Video Visit from 01/31/2021 in [facility] Video Visit from 12/06/2020 in [facility] Video Visit from 10/13/2020 in [facility] Counselor from 08/30/2020 in [place]  PHQ-2 Total Score 4 1 4 4 3   PHQ-9 Total Score 12 -- 16 19 10       Flowsheet Row Video Visit from 04/04/2021 in [facility] Video Visit from 01/31/2021 in [facility] Video Visit from 12/06/2020 in [place] No Risk No Risk No Risk        Assessment and Plan:   Jacen Carlini. Marney is a 60 year old male with a past psychiatric history significant for insomnia, bipolar disorder, generalized anxiety disorder, and panic disorder who presents to  [facility] via virtual video visit for follow-up and medication management.  Patient reports that he has not been able to take his medications due to lack of funds.  Patient also reports that he has also not filled out the documents that would allow him to purchase Latuda at a discounted rate.  Patient would like to continue taking Zoloft but states  that he no longer needs to take trazodone due to sleeping well on his own.  Patient was recommended to start Zoloft 50 mg daily for the management of his depressive episodes.  Provider to contact [facility] for information regarding for financial assistance for [place].  Patient's medication to be e-prescribed to pharmacy of choice.  1. Bipolar I disorder, most recent episode depressed (HCC)  - sertraline (ZOLOFT) 50 MG tablet; Take 1 tablet (50 mg total) by mouth daily.  Dispense: 30 tablet; Refill: 1  2. Generalized anxiety disorder  - sertraline (ZOLOFT) 50 MG tablet; Take 1 tablet (50 mg total) by mouth daily.  Dispense: 30 tablet; Refill: 1  3. Panic disorder  - sertraline (ZOLOFT) 50 MG tablet; Take 1 tablet (50 mg total) by mouth daily.  Dispense: 30 tablet; Refill: 1  Patient to follow up in 6 weeks Provider spent a total of 18 minutes with the patient/reviewing patient's chart  Christian Mood, PA 04/04/2021, 7:51 PM

## 2021-04-05 ENCOUNTER — Other Ambulatory Visit: Payer: Self-pay

## 2021-04-06 ENCOUNTER — Encounter (HOSPITAL_COMMUNITY): Payer: Self-pay | Admitting: Physician Assistant

## 2021-04-06 ENCOUNTER — Ambulatory Visit: Payer: Self-pay | Admitting: Physician Assistant

## 2021-04-06 ENCOUNTER — Other Ambulatory Visit: Payer: Self-pay

## 2021-04-07 ENCOUNTER — Other Ambulatory Visit: Payer: Self-pay

## 2021-04-11 ENCOUNTER — Ambulatory Visit (INDEPENDENT_AMBULATORY_CARE_PROVIDER_SITE_OTHER): Payer: Medicaid Other | Admitting: Physician Assistant

## 2021-04-11 ENCOUNTER — Encounter: Payer: Self-pay | Admitting: Physician Assistant

## 2021-04-11 ENCOUNTER — Other Ambulatory Visit (INDEPENDENT_AMBULATORY_CARE_PROVIDER_SITE_OTHER): Payer: Medicaid Other

## 2021-04-11 VITALS — BP 120/68 | HR 90 | Ht 66.0 in | Wt 331.0 lb

## 2021-04-11 DIAGNOSIS — R748 Abnormal levels of other serum enzymes: Secondary | ICD-10-CM

## 2021-04-11 DIAGNOSIS — Z6841 Body Mass Index (BMI) 40.0 and over, adult: Secondary | ICD-10-CM | POA: Diagnosis not present

## 2021-04-11 DIAGNOSIS — R1011 Right upper quadrant pain: Secondary | ICD-10-CM | POA: Diagnosis not present

## 2021-04-11 LAB — CBC WITH DIFFERENTIAL/PLATELET
Basophils Absolute: 0 10*3/uL (ref 0.0–0.1)
Basophils Relative: 0.3 % (ref 0.0–3.0)
Eosinophils Absolute: 0.1 10*3/uL (ref 0.0–0.7)
Eosinophils Relative: 1.2 % (ref 0.0–5.0)
HCT: 34 % — ABNORMAL LOW (ref 39.0–52.0)
Hemoglobin: 11.3 g/dL — ABNORMAL LOW (ref 13.0–17.0)
Lymphocytes Relative: 12.1 % (ref 12.0–46.0)
Lymphs Abs: 1.3 10*3/uL (ref 0.7–4.0)
MCHC: 33.1 g/dL (ref 30.0–36.0)
MCV: 82.2 fl (ref 78.0–100.0)
Monocytes Absolute: 0.4 10*3/uL (ref 0.1–1.0)
Monocytes Relative: 3.8 % (ref 3.0–12.0)
Neutro Abs: 8.7 10*3/uL — ABNORMAL HIGH (ref 1.4–7.7)
Neutrophils Relative %: 82.6 % — ABNORMAL HIGH (ref 43.0–77.0)
Platelets: 349 10*3/uL (ref 150.0–400.0)
RBC: 4.14 Mil/uL — ABNORMAL LOW (ref 4.22–5.81)
RDW: 20.4 % — ABNORMAL HIGH (ref 11.5–15.5)
WBC: 10.5 10*3/uL (ref 4.0–10.5)

## 2021-04-11 LAB — PROTIME-INR
INR: 1.1 ratio — ABNORMAL HIGH (ref 0.8–1.0)
Prothrombin Time: 12.4 s (ref 9.6–13.1)

## 2021-04-11 LAB — COMPREHENSIVE METABOLIC PANEL
ALT: 19 U/L (ref 0–53)
AST: 15 U/L (ref 0–37)
Albumin: 3 g/dL — ABNORMAL LOW (ref 3.5–5.2)
Alkaline Phosphatase: 113 U/L (ref 39–117)
BUN: 7 mg/dL (ref 6–23)
CO2: 35 mEq/L — ABNORMAL HIGH (ref 19–32)
Calcium: 8.4 mg/dL (ref 8.4–10.5)
Chloride: 96 mEq/L (ref 96–112)
Creatinine, Ser: 0.76 mg/dL (ref 0.40–1.50)
GFR: 97.65 mL/min (ref >60.00)
Glucose, Bld: 109 mg/dL — ABNORMAL HIGH (ref 70–99)
Potassium: 3.7 mEq/L (ref 3.5–5.1)
Sodium: 138 mEq/L (ref 135–145)
Total Bilirubin: 0.4 mg/dL (ref 0.2–1.2)
Total Protein: 6.7 g/dL (ref 6.0–8.3)

## 2021-04-11 LAB — IBC + FERRITIN
Ferritin: 266.6 ng/mL (ref 22.0–322.0)
Iron: 34 ug/dL — ABNORMAL LOW (ref 42–165)
Saturation Ratios: 14.9 % — ABNORMAL LOW (ref 20.0–50.0)
TIBC: 228.2 ug/dL — ABNORMAL LOW (ref 250.0–450.0)
Transferrin: 163 mg/dL — ABNORMAL LOW (ref 212.0–360.0)

## 2021-04-11 NOTE — Progress Notes (Signed)
Chief Complaint: Elevated alkaline phosphatase  HPI:    Christian Guerra is a 60 year old male with a past medical history as listed below including reflux, hepatitis C, morbid obesity and others listed below, known to Dr. Carlean Purl, who presents to clinic today for a complaint of elevated alk phos.    10/28/2018 patient seen in clinic by Dr. Carlean Purl for weight loss and abdominal pain.  That time recommend an EGD and colonoscopy.    11/11/2018 colonoscopy with 1 diminutive polyp in the descending colon and otherwise normal.  EGD on the same day with multiple gastric polyps.  Pathology showed benign polyps in the stomach and colon.  Repeat colonoscopy recommended 2027.    03/03/2021 labs show white count of 14.3, neutrophils 11.1.  BMP with a glucose elevated 114, sodium 133, potassium 3.2, chloride 86.  Hepatic function panel shows an albumin of 2.9, direct bilirubin elevated 0.45, alk phos elevated at 424, AST elevated at 45 and ALT elevated at 75.    04/02/2021 there is a phone message from Dr. Carlean Purl noting the patient had labs showing an alk phos of 424.  Apparently had an MRI of the abdomen in 2020 which showed cholelithiasis, hepatomegaly and severe hepatic steatosis.    Today, the patient tells me that he went to see his PCP for regular visit and was having some pressure in his right upper quadrant around that time.  Tells me he was given some antibiotics for a tooth abscess he also developed around that same time and this pain went away.  Describes that now he is feeling well.  He is trying to lose some weight and recently won a "water fast", where he just drinks water for 28 days straight.  This was after his labs though.  Does describe a history of hepatitis C which he believes he got from blood transfusion years ago.    Denies fever, chills, change in bowel habits, abdominal pain, nausea, vomiting, heartburn or reflux.  Past Medical History:  Diagnosis Date   Anxiety    Bilateral swelling of feet  and ankles    Bruised kidney 09/10/2016   Chronic lower back pain    Chronic pain    Daily headache    Diastolic dysfunction with chronic heart failure (HCC)    Fundic gland polyps of stomach, benign    GERD (gastroesophageal reflux disease)    Hepatitis C    "treated; got down to 0 load 6-8 years ago" (09/13/2016)   Hypertension    Memory difficulties    "in the last week or so" (09/13/2016)   Morbid obesity ([place])    Personal history of colonic adenoma 01/20/2013   Recurrent falls    "recently" (09/13/2016)   Sleep apnea    "wore mask at night before; couldn't tolerate it" (09/13/2016)   Wears glasses     Past Surgical History:  Procedure Laterality Date   ANKLE FRACTURE SURGERY Bilateral 2001   [place]   BIOPSY  11/11/2018   Procedure: BIOPSY;  Surgeon: Gatha Mayer, MD;  [facility];  Service: Endoscopy;;   COLONOSCOPY     COLONOSCOPY WITH PROPOFOL N/A 11/11/2018   Procedure: COLONOSCOPY WITH PROPOFOL;  Surgeon: Gatha Mayer, MD;  [facility];  Service: Endoscopy;  Laterality: N/A;   ESOPHAGOGASTRODUODENOSCOPY (EGD) WITH PROPOFOL N/A 11/11/2018   Procedure: ESOPHAGOGASTRODUODENOSCOPY (EGD) WITH PROPOFOL;  Surgeon: Gatha Mayer, MD;  [facility];  Service: Endoscopy;  Laterality: N/A;   FOREARM SURGERY Left  2001   "chain saw injury"   POLYPECTOMY  11/11/2018   Procedure: POLYPECTOMY;  Surgeon: Gatha Mayer, MD;  [facility];  Service: Endoscopy;;   TESTICLE TORSION REDUCTION  1980    Current Outpatient Medications  Medication Sig Dispense Refill   albuterol (VENTOLIN HFA) 108 (90 Base) MCG/ACT inhaler Inhale 1 puff into the lungs every 6 (six) hours as needed for wheezing or shortness of breath. 1 Inhaler 0   atenolol (TENORMIN) 50 MG tablet Take 1 tablet (50 mg total) by mouth daily. 30 tablet 2   clonazePAM (KLONOPIN) 0.5 MG tablet Take 0.5 tablets (0.25 mg total) by mouth 2 (two) times daily. 60 tablet 0    cyclobenzaprine (FLEXERIL) 10 MG tablet Take 10 mg by mouth 3 (three) times daily as needed for muscle spasms.     gabapentin (NEURONTIN) 600 MG tablet Take 600 mg by mouth 3 (three) times daily.     hydrochlorothiazide (HYDRODIURIL) 25 MG tablet Take 25 mg by mouth daily.     Lurasidone HCl (LATUDA) 60 MG TABS Take 1 tablet (60 mg total) by mouth daily with breakfast. 30 tablet 1   minocycline (MINOCIN) 100 MG capsule Take 100 mg by mouth 3 (three) times daily.     nystatin cream (MYCOSTATIN) Apply 1 application topically 2 (two) times daily. 60 g 2   omeprazole (PRILOSEC) 20 MG capsule Take 1 capsule (20 mg total) by mouth daily. (Patient taking differently: Take 20 mg by mouth daily at 2 PM. ) 90 capsule 3   oxyCODONE (ROXICODONE) 15 MG immediate release tablet Take 15 mg by mouth every 8 (eight) hours.      sertraline (ZOLOFT) 50 MG tablet Take 1 tablet (50 mg total) by mouth daily. 30 tablet 1   tamsulosin (FLOMAX) 0.4 MG CAPS capsule Take 1 capsule (0.4 mg total) by mouth daily at 2 PM. 30 capsule 6   traZODone (DESYREL) 50 MG tablet Take 0.5 tablets (25 mg total) by mouth at bedtime as needed for sleep. 15 tablet 1   No current facility-administered medications for this visit.    Allergies as of 04/11/2021 - Review Complete 04/06/2021  Allergen Reaction Noted   Codeine Itching 11/07/2011    Family History  Problem Relation Age of Onset   Hypertension Father        old age--66   Other Father        enlarged prostate   Colon cancer Neg Hx     Social History   Socioeconomic History   Marital status: Single    Spouse name: Not on file   Number of children: 0   Years of education: Not on file   Highest education level: Not on file  Occupational History   Occupation: unemployeed  Tobacco Use   Smoking status: Never   Smokeless tobacco: Current    Types: Snuff  Vaping Use   Vaping Use: Never used  Substance and Sexual Activity   Alcohol use: Not Currently    Comment: 5  years sober.    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, not now, and non-smoker   Social Determinants of Health   Financial Resource Strain: High Risk   Difficulty of Paying Living Expenses: Hard  Food Insecurity: Food Insecurity Present   Worried About Running Out of Food in the Last Year: Sometimes true   Ran Out of Food in the Last Year: Sometimes true  Transportation Needs: No Transportation Needs   Lack of Transportation (Medical): No   Lack of Transportation (Non-Medical): No  Physical Activity: Inactive   Days of Exercise per Week: 0 days   Minutes of Exercise per Session: 0 min  Stress: Stress Concern Present   Feeling of Stress : Very much  Social Connections: Socially Isolated   Frequency of Communication with Friends and Family: More than three times a week   Frequency of Social Gatherings with Friends and Family: Never   Attends Religious Services: Never   Marine scientist or Organizations: No   Attends Music therapist: Never   Marital Status: Never married  Human resources officer Violence: Not At Risk   Fear of Current or Ex-Partner: No   Emotionally Abused: No   Physically Abused: No   Sexually Abused: No    Review of Systems:    Constitutional: No weight loss, fever or chills Cardiovascular: No chest pain  Respiratory: No SOB Gastrointestinal: See HPI and otherwise negative   Physical Exam:  Vital signs: BP 120/68    Pulse 90    Ht 5' 6"  (1.676 m)    Wt (!) 331 lb (150.1 kg)    BMI 53.42 kg/m    Constitutional:   Pleasant morbidly obese Caucasian male appears  to be in NAD, Well developed, Well nourished, alert and cooperative Respiratory: Respirations even and unlabored. Lungs clear to auscultation bilaterally.   No wheezes, crackles, or rhonchi.  Cardiovascular: Normal S1, S2. No MRG. Regular rate and rhythm. No peripheral edema, cyanosis or pallor.  Gastrointestinal:  Soft, nondistended, nontender. No rebound or guarding. Normal bowel sounds. No appreciable masses or hepatomegaly. Rectal:  Not performed.  Psychiatric: Demonstrates good judgement and reason without abnormal affect or behaviors.  See HPI for recent labs.  Assessment: 1.  Elevated LFTs: As in HPI, no recent medication changes, known severe fatty liver on MRI in 2020; consider fatty liver +/- other causes 2.  Right upper quadrant pain: Patient was experiencing some pain in the right upper quadrant which she thinks went away after he took antibiotics for tooth abscess, known history of cholelithiasis, question symptomatic cholelithiasis with biliary colic 3.  Morbid obesity  Plan: 1.  Due to patient's obesity ordered a CT of the abdomen with liver protocol for further evaluation 2.  Ordered labs including a CBC, CMP, PT/INR, iron studies and ferritin, IgG and IgA, ANA, ASMA, AMA, alpha-1 antitrypsin, ceruloplasmin and hepatitis labs 3.  Patient to follow in clinic per recommendations after labs/imaging above.  Ellouise Newer, PA-C [facility] 04/11/2021, 1:29 PM  Cc: Christian Junior, MD

## 2021-04-11 NOTE — Addendum Note (Signed)
Addended by: Azzie Almas on: 04/11/2021 02:13 PM   Modules accepted: Orders

## 2021-04-11 NOTE — Patient Instructions (Addendum)
IMAGING: You will be contacted by [facility] (Your caller ID will indicate phone # [phone]) in the next 7 days to schedule your Abdominal CT scan. If you have not heard from them within 7 business days, please call [facility] at [phone] to follow up on the status of your appointment.   We have given you the oral contrast today to take home.  LABS:  Lab work has been ordered for you today. Our lab is located in the basement. Press "B" on the elevator. The lab is located at the first door on the left as you exit the elevator.  HEALTHCARE LAWS AND MY CHART RESULTS: Due to recent changes in healthcare laws, you may see the results of your imaging and laboratory studies on MyChart before your provider has had a chance to review them.   We understand that in some cases there may be results that are confusing or concerning to you. Not all laboratory results come back in the same time frame and the provider may be waiting for multiple results in order to interpret others.  Please give [place] 48 hours in order for your provider to thoroughly review all the results before contacting the office for clarification of your results.   Further follow up after CT and labs are done.  It was great seeing you today! Thank you for entrusting me with your care and choosing [facility].  Ellouise Newer, PA  The [facility] providers would like to encourage you to use [facility] to communicate with providers for non-urgent requests or questions.  Due to long hold times on the telephone, sending your provider a message by [facility] may be faster and more efficient way to get a response. Please allow 48 business hours for a response.  Please remember that this is for non-urgent requests/questions.  If you are age 3 or older, your body mass index should be between 23-30. Your Body mass index is 53.42 kg/m. If this is out of the aforementioned range listed, please  consider follow up with your Primary Care Provider.  If you are age 43 or younger, your body mass index should be between 19-25. Your Body mass index is 53.42 kg/m. If this is out of the aformentioned range listed, please consider follow up with your Primary Care Provider.

## 2021-04-16 LAB — ANA: Anti Nuclear Antibody (ANA): NEGATIVE

## 2021-04-16 LAB — IGA: Immunoglobulin A: 674 mg/dL — ABNORMAL HIGH (ref 47–310)

## 2021-04-16 LAB — HEPATITIS A ANTIBODY, TOTAL: Hepatitis A AB,Total: NONREACTIVE

## 2021-04-16 LAB — CERULOPLASMIN: Ceruloplasmin: 28 mg/dL (ref 18–36)

## 2021-04-16 LAB — HEPATITIS B SURFACE ANTIGEN: Hepatitis B Surface Ag: NONREACTIVE

## 2021-04-16 LAB — HEPATITIS B SURFACE ANTIBODY,QUALITATIVE: Hep B S Ab: NONREACTIVE

## 2021-04-16 LAB — TISSUE TRANSGLUTAMINASE ABS,IGG,IGA
(tTG) Ab, IgA: <1 U/mL
(tTG) Ab, IgG: <1 U/mL

## 2021-04-16 LAB — ANTI-SMOOTH MUSCLE ANTIBODY, IGG: Actin (Smooth Muscle) Antibody (IGG): <20 U (ref <20)

## 2021-04-16 LAB — ALPHA-1-ANTITRYPSIN: A-1 Antitrypsin, Ser: 261 mg/dL — ABNORMAL HIGH (ref 83–199)

## 2021-04-16 LAB — MITOCHONDRIAL ANTIBODIES: Mitochondrial M2 Ab, IgG: <=20 U (ref <=20.0)

## 2021-04-26 ENCOUNTER — Other Ambulatory Visit: Payer: Self-pay

## 2021-04-26 ENCOUNTER — Ambulatory Visit (HOSPITAL_COMMUNITY)
Admission: RE | Admit: 2021-04-26 | Discharge: 2021-04-26 | Disposition: A | Discharge status: Home / Self Care | Payer: Medicaid Other | Source: Ambulatory Visit | Attending: Physician Assistant | Admitting: Physician Assistant

## 2021-04-26 DIAGNOSIS — R1011 Right upper quadrant pain: Secondary | ICD-10-CM | POA: Insufficient documentation

## 2021-04-26 DIAGNOSIS — R748 Abnormal levels of other serum enzymes: Secondary | ICD-10-CM | POA: Insufficient documentation

## 2021-04-26 MED ORDER — SODIUM CHLORIDE (PF) 0.9 % IJ SOLN
INTRAMUSCULAR | Status: AC
  Filled 2021-04-26: qty 50

## 2021-04-26 MED ORDER — IOHEXOL 350 MG/ML SOLN
100.0000 mL | Freq: Once | INTRAVENOUS | Status: AC | PRN
  Administered 2021-04-26: 100 mL via INTRAVENOUS

## 2021-04-28 ENCOUNTER — Other Ambulatory Visit: Payer: Self-pay

## 2021-04-28 DIAGNOSIS — R768 Other specified abnormal immunological findings in serum: Secondary | ICD-10-CM

## 2021-04-28 DIAGNOSIS — Z23 Encounter for immunization: Secondary | ICD-10-CM

## 2021-04-28 DIAGNOSIS — K7460 Unspecified cirrhosis of liver: Secondary | ICD-10-CM

## 2021-04-28 DIAGNOSIS — K7581 Nonalcoholic steatohepatitis (NASH): Secondary | ICD-10-CM

## 2021-04-28 NOTE — Addendum Note (Signed)
Addended by: Yevette Edwards on: 04/28/2021 02:14 PM   Modules accepted: Orders

## 2021-05-05 ENCOUNTER — Other Ambulatory Visit: Payer: Medicaid Other

## 2021-05-05 DIAGNOSIS — K7469 Other cirrhosis of liver: Secondary | ICD-10-CM

## 2021-05-05 DIAGNOSIS — K7460 Unspecified cirrhosis of liver: Secondary | ICD-10-CM

## 2021-05-05 DIAGNOSIS — R768 Other specified abnormal immunological findings in serum: Secondary | ICD-10-CM

## 2021-05-05 DIAGNOSIS — R7689 Other specified abnormal immunological findings in serum: Secondary | ICD-10-CM

## 2021-05-08 LAB — HEPATITIS C RNA QUANTITATIVE
HCV Quantitative Log: <1.18 log IU/mL
HCV RNA, PCR, QN: <15 IU/mL

## 2021-06-05 ENCOUNTER — Encounter: Payer: Self-pay | Admitting: Internal Medicine

## 2021-06-05 ENCOUNTER — Other Ambulatory Visit (INDEPENDENT_AMBULATORY_CARE_PROVIDER_SITE_OTHER): Payer: Medicaid Other

## 2021-06-05 ENCOUNTER — Ambulatory Visit (INDEPENDENT_AMBULATORY_CARE_PROVIDER_SITE_OTHER): Payer: Medicaid Other | Admitting: Internal Medicine

## 2021-06-05 VITALS — BP 126/62 | HR 75 | Ht 66.0 in | Wt 331.0 lb

## 2021-06-05 DIAGNOSIS — Z23 Encounter for immunization: Secondary | ICD-10-CM | POA: Diagnosis not present

## 2021-06-05 DIAGNOSIS — K760 Fatty (change of) liver, not elsewhere classified: Secondary | ICD-10-CM | POA: Diagnosis not present

## 2021-06-05 NOTE — Patient Instructions (Signed)
Your provider has requested that you go to the basement level for lab work before leaving today. Press "B" on the elevator. The lab is located at the first door on the left as you exit the elevator.  Due to recent changes in healthcare laws, you may see the results of your imaging and laboratory studies on MyChart before your provider has had a chance to review them.  We understand that in some cases there may be results that are confusing or concerning to you. Not all laboratory results come back in the same time frame and the provider may be waiting for multiple results in order to interpret others.  Please give [place] 48 hours in order for your provider to thoroughly review all the results before contacting the office for clarification of your results.   Ask your PCP about pneumonia vaccines.  Call in June for a 6 month follow up appointment.  I appreciate the opportunity to care for you. Silvano Rusk, MD, [facility]

## 2021-06-05 NOTE — Progress Notes (Signed)
Christian Guerra 61 y.o. 1961-02-06 [id number]  Assessment & Plan:   Encounter Diagnoses  Name Primary?   NAFLD (nonalcoholic fatty liver disease) Yes   Severe obesity (BMI >= 40) (HCC)     HAV/HBV vaccine 6 mo follow-up with me Stop water fasts and instead try intermittent fasting/low-carb.  Handouts and book recommendations provided.  Lab Results  Component Value Date   ALT 14 06/05/2021   AST 12 06/05/2021   ALKPHOS 138 (H) 06/05/2021   BILITOT 0.3 06/05/2021   He is to ask his primary care provider about timing of next pneumonia vaccine.   CC: Christian Junior, MD  Subjective:   Chief Complaint: Follow-up of nonalcoholic fatty liver disease  HPI This 61 year old white man was seen by Christian Guerra in December he had elevation of LFTs in the setting of severe obesity and nonalcoholic fatty liver disease.     03/03/2021 labs show white count of 14.3, neutrophils 11.1.  BMP with a glucose elevated 114, sodium 133, potassium 3.2, chloride 86.  Hepatic function panel shows an albumin of 2.9, direct bilirubin elevated 0.45, alk phos elevated at 424, AST elevated at 45 and ALT elevated at 75.   Repeat lab testing showed normalization of LFTs.  He was not ill at the time he had these labs in November that he was aware of.  He had a serologic work-up with iron studies, celiac labs alpha-1 antitrypsin mitochondrial antibodies ANA anti-smooth muscle antibody ceruloplasmin and hepatitis a and B studies.  These were all okay he is not immune to hepatitis A or B.  We also had him recheck a hepatitis C RNA as he has been treated in the past and just wanted to exclude any recurrent infection and that was negative.  He has been trying to lose weight and has been successful though he has been doing some water fast for more than 2 weeks at a time.  It sounds like he simply only has water or other fluids.   Wt Readings from Last 3 Encounters:  06/05/21 (!) 331 lb (150.1 kg)   04/11/21 (!) 331 lb (150.1 kg)  11/11/18 (!) 375 lb (170.1 kg)    Allergies  Allergen Reactions   Codeine Itching   Current Meds  Medication Sig   albuterol (VENTOLIN HFA) 108 (90 Base) MCG/ACT inhaler Inhale 1 puff into the lungs every 6 (six) hours as needed for wheezing or shortness of breath.   atenolol (TENORMIN) 50 MG tablet Take 1 tablet (50 mg total) by mouth daily.   cyclobenzaprine (FLEXERIL) 10 MG tablet Take 10 mg by mouth 3 (three) times daily as needed for muscle spasms.   gabapentin (NEURONTIN) 600 MG tablet Take 600 mg by mouth 3 (three) times daily.   hydrochlorothiazide (HYDRODIURIL) 25 MG tablet Take 25 mg by mouth daily.   Lurasidone HCl (LATUDA) 60 MG TABS Take 1 tablet (60 mg total) by mouth daily with breakfast.   minocycline (MINOCIN) 100 MG capsule Take 100 mg by mouth 3 (three) times daily.   omeprazole (PRILOSEC) 20 MG capsule Take 1 capsule (20 mg total) by mouth daily. (Patient taking differently: Take 20 mg by mouth daily at 2 PM.)   oxyCODONE (ROXICODONE) 15 MG immediate release tablet Take 15 mg by mouth every 8 (eight) hours.   sertraline (ZOLOFT) 50 MG tablet Take 1 tablet (50 mg total) by mouth daily.   tamsulosin (FLOMAX) 0.4 MG CAPS capsule Take 1 capsule (0.4 mg total) by mouth daily at  2 PM.   Past Medical History:  Diagnosis Date   Anxiety    Bilateral swelling of feet and ankles    Bruised kidney 09/10/2016   Chronic lower back pain    Chronic pain    Daily headache    Diastolic dysfunction with chronic heart failure (HCC)    Fundic gland polyps of stomach, benign    GERD (gastroesophageal reflux disease)    Hepatitis C    "treated; got down to 0 load 6-8 years ago" (09/13/2016)   Hypertension    Memory difficulties    "in the last week or so" (09/13/2016)   Morbid obesity ([place])    Personal history of colonic adenoma 01/20/2013   Recurrent falls    "recently" (09/13/2016)   Sleep apnea    "wore mask at night before; couldn't tolerate  it" (09/13/2016)   Wears glasses    Past Surgical History:  Procedure Laterality Date   ANKLE FRACTURE SURGERY Bilateral 2001   [place]   BIOPSY  11/11/2018   Procedure: BIOPSY;  Surgeon: Christian Mayer, MD;  Location: [facility];  Service: Endoscopy;;   COLONOSCOPY     COLONOSCOPY WITH PROPOFOL N/A 11/11/2018   Procedure: COLONOSCOPY WITH PROPOFOL;  Surgeon: Christian Mayer, MD;  [facility];  Service: Endoscopy;  Laterality: N/A;   ESOPHAGOGASTRODUODENOSCOPY (EGD) WITH PROPOFOL N/A 11/11/2018   Procedure: ESOPHAGOGASTRODUODENOSCOPY (EGD) WITH PROPOFOL;  Surgeon: Christian Mayer, MD;  [facility];  Service: Endoscopy;  Laterality: N/A;   FOREARM SURGERY Left 2001   "chain saw injury"   POLYPECTOMY  11/11/2018   Procedure: POLYPECTOMY;  Surgeon: Christian Mayer, MD;  [facility];  Service: Endoscopy;;   TESTICLE TORSION REDUCTION  1980   Social History   Social History Narrative   He is single and unemployed, he lives with his younger brother   He is a participant in the [facility] and an orange card program   Followed at [facility] health for mental illness   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   Review of Systems As above  Objective:   Physical Exam BP 126/62    Pulse 75    Ht _0  (1.676 m)    Wt (!) 331 lb (150.1 kg)    BMI 53.42 kg/m  Obese middle-aged white man appearing chronically ill in no acute distress in a wheelchair He is alert and oriented x3 Both lower extremities have 3+ pitting edema and chronic venous stasis changes

## 2021-06-06 ENCOUNTER — Encounter (HOSPITAL_COMMUNITY): Payer: Self-pay | Admitting: Physician Assistant

## 2021-06-06 ENCOUNTER — Telehealth (INDEPENDENT_AMBULATORY_CARE_PROVIDER_SITE_OTHER): Payer: No Payment, Other | Admitting: Physician Assistant

## 2021-06-06 ENCOUNTER — Other Ambulatory Visit: Payer: Self-pay

## 2021-06-06 ENCOUNTER — Encounter: Payer: Self-pay | Admitting: Internal Medicine

## 2021-06-06 DIAGNOSIS — F410 Panic disorder [episodic paroxysmal anxiety] without agoraphobia: Secondary | ICD-10-CM

## 2021-06-06 DIAGNOSIS — F411 Generalized anxiety disorder: Secondary | ICD-10-CM | POA: Diagnosis not present

## 2021-06-06 DIAGNOSIS — F3130 Bipolar disorder, current episode depressed, mild or moderate severity, unspecified: Secondary | ICD-10-CM | POA: Diagnosis not present

## 2021-06-06 LAB — HEPATIC FUNCTION PANEL
ALT: 14 U/L (ref 0–53)
AST: 12 U/L (ref 0–37)
Albumin: 3.4 g/dL — ABNORMAL LOW (ref 3.5–5.2)
Alkaline Phosphatase: 138 U/L — ABNORMAL HIGH (ref 39–117)
Bilirubin, Direct: 0 mg/dL (ref 0.0–0.3)
Total Bilirubin: 0.3 mg/dL (ref 0.2–1.2)
Total Protein: 7.5 g/dL (ref 6.0–8.3)

## 2021-06-06 MED ORDER — LURASIDONE HCL 20 MG PO TABS: ORAL_TABLET | ORAL | 1 refills | Status: DC

## 2021-06-06 NOTE — Progress Notes (Signed)
BH MD/PA/NP OP Progress Note  Virtual Visit via Video Note  I connected with Christian Guerra on 06/06/21 at 10:30 AM EST by a video enabled telemedicine application and verified that I am speaking with the correct person using two identifiers.  Location: Patient: Home Provider: Clinic   I discussed the limitations of evaluation and management by telemedicine and the availability of in person appointments. The patient expressed understanding and agreed to proceed.  Follow Up Instructions:   I discussed the assessment and treatment plan with the patient. The patient was provided an opportunity to ask questions and all were answered. The patient agreed with the plan and demonstrated an understanding of the instructions.   The patient was advised to call back or seek an in-person evaluation if the symptoms worsen or if the condition fails to improve as anticipated.  I provided 20 minutes of non-face-to-face time during this encounter.  [name], PA    06/06/2021 5:57 PM [name]  MRN:  [id number]  Chief Complaint:  Chief Complaint  Patient presents with   Follow-up   Medication Refill   HPI:   Christian Guerra. [employer] is a 61 year old male with a past psychiatric history significant for bipolar 1 disorder, generalized anxiety disorder, and panic disorder who presents to [facility] via virtual video visit for follow-up and medication management.  Patient is currently managed on the following medication: Sertraline 50 mg daily.  Patient reports no issue with his medication.  Patient was originally taking Latuda in the but was not able to take the medication due to lack of insurance.  Patient reports that he now has Medicaid.  Patient states that he recently experienced depressive episodes due to discontinuing his pain medication.  During his depressive episodes, patient experienced low mood, hopelessness, and rumination over past  history.  He describes himself as feeling real defeated during these episodes.  Patient continues to struggle with anxiety and receiving diarrhea 10 out of 10.  He reports that his anxiety is accompanied by fidgeting with his fingers and muscle aches due to being tense.  A PHQ-9 screen was performed with the patient scored an 18.  A GAD-7 screen was also performed with the patient's count of 16.  Patient is alert and oriented x4, pleasant, calm, cooperative, and fully engaged in conversation during the encounter.  Patient endorses happy mood and states that he feels optimistic.  Patient denies suicidal or homicidal ideations.  He further denies auditory or visual hallucinations and does not appear to be responding to internal/external stimuli.  Patient endorses good sleep and receives on average 8 hours of sleep each night.  Patient endorses fair appetite and eats on average 2 meals per day.  Patient denies alcohol consumption and illicit drug use.  Patient endorses tobacco use in the form of chew.  Visit Diagnosis:    ICD-10-CM   1. Bipolar I disorder, most recent episode depressed (HCC)  F31.30 lurasidone (LATUDA) 20 MG TABS tablet    2. Generalized anxiety disorder  F41.1     3. Panic disorder  F41.0       Past Psychiatric History:  Panic disorder Generalized anxiety disorder Insomnia Bipolar disorder  Past Medical History:  Past Medical History:  Diagnosis Date   Anxiety    Bilateral swelling of feet and ankles    Bruised kidney 09/10/2016   Chronic lower back pain    Chronic pain    Daily headache    Diastolic  dysfunction with chronic heart failure (HCC)    Fundic gland polyps of stomach, benign    GERD (gastroesophageal reflux disease)    Hepatitis C    "treated; got down to 0 load 6-8 years ago" (09/13/2016)   Hypertension    Memory difficulties    "in the last week or so" (09/13/2016)   Morbid obesity ([place])    Personal history of colonic adenoma 01/20/2013   Recurrent  falls    "recently" (09/13/2016)   Sleep apnea    "wore mask at night before; couldn't tolerate it" (09/13/2016)   Wears glasses     Past Surgical History:  Procedure Laterality Date   ANKLE FRACTURE SURGERY Bilateral 2001   [place]   BIOPSY  11/11/2018   Procedure: BIOPSY;  Surgeon: Gatha Mayer, MD;  [facility];  Service: Endoscopy;;   COLONOSCOPY     COLONOSCOPY WITH PROPOFOL N/A 11/11/2018   Procedure: COLONOSCOPY WITH PROPOFOL;  Surgeon: Gatha Mayer, MD;  [facility];  Service: Endoscopy;  Laterality: N/A;   ESOPHAGOGASTRODUODENOSCOPY (EGD) WITH PROPOFOL N/A 11/11/2018   Procedure: ESOPHAGOGASTRODUODENOSCOPY (EGD) WITH PROPOFOL;  Surgeon: Gatha Mayer, MD;  [facility];  Service: Endoscopy;  Laterality: N/A;   FOREARM SURGERY Left 2001   "chain saw injury"   POLYPECTOMY  11/11/2018   Procedure: POLYPECTOMY;  Surgeon: Gatha Mayer, MD;  [facility];  Service: Endoscopy;;   TESTICLE TORSION REDUCTION  1980    Family Psychiatric History:  Patient denies a family history of psychiatric illness  Family History:  Family History  Problem Relation Age of Onset   Hypertension Father        old age--75   Other Father        enlarged prostate   Colon cancer Neg Hx     Social History:  Social History   Socioeconomic History   Marital status: Single    Spouse name: Not on file   Number of children: 0   Years of education: Not on file   Highest education level: Not on file  Occupational History   Occupation: unemployeed  Tobacco Use   Smoking status: Never   Smokeless tobacco: Current    Types: Snuff  Vaping Use   Vaping Use: Never used  Substance and Sexual Activity   Alcohol use: Not Currently    Comment: 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, not now, and non-smoker   Social Determinants of Radio broadcast assistant Strain: High Risk   Difficulty of Paying Living Expenses: Hard  Food Insecurity: Food Insecurity Present   Worried About Charity fundraiser in the Last Year: Sometimes true   Ran Out of Food in the Last Year: Sometimes true  Transportation Needs: No Transportation Needs   Lack of Transportation (Medical): No   Lack of Transportation (Non-Medical): No  Physical Activity: Inactive   Days of Exercise per Week: 0 days   Minutes of Exercise per Session: 0 min  Stress: Stress Concern Present  Feeling of Stress : Very much  Social Connections: Socially Isolated   Frequency of Communication with Friends and Family: More than three times a week   Frequency of Social Gatherings with Friends and Family: Never   Attends Religious Services: Never   Marine scientist or Organizations: No   Attends Archivist Meetings: Never   Marital Status: Never married    Allergies:  Allergies  Allergen Reactions   Codeine Itching    Metabolic Disorder Labs: Lab Results  Component Value Date   HGBA1C 5.5 03/03/2021   MPG 114 06/15/2016   MPG 108 09/23/2012   No results found for: PROLACTIN Lab Results  Component Value Date   CHOL 88 03/03/2021   TRIG 149 03/03/2021   HDL 16 (A) 03/03/2021   CHOLHDL 4.4 06/05/2018   VLDL 32 (H) 07/02/2016   LDLCALC 46 03/03/2021   LDLCALC 77 06/05/2018   Lab Results  Component Value Date   TSH 0.20 (A) 03/03/2021   TSH 1.970 06/05/2018    Therapeutic Level Labs: No results found for: LITHIUM No results found for: VALPROATE No components found for:  CBMZ  Current Medications: Current Outpatient Medications   Medication Sig Dispense Refill   lurasidone (LATUDA) 20 MG TABS tablet Patient to take 1 tablet (20 mg total) for 6 days then continue taking 2 tablets (40 mg total) daily 60 tablet 1   albuterol (VENTOLIN HFA) 108 (90 Base) MCG/ACT inhaler Inhale 1 puff into the lungs every 6 (six) hours as needed for wheezing or shortness of breath. 1 Inhaler 0   atenolol (TENORMIN) 50 MG tablet Take 1 tablet (50 mg total) by mouth daily. 30 tablet 2   cyclobenzaprine (FLEXERIL) 10 MG tablet Take 10 mg by mouth 3 (three) times daily as needed for muscle spasms.     gabapentin (NEURONTIN) 600 MG tablet Take 600 mg by mouth 3 (three) times daily.     hydrochlorothiazide (HYDRODIURIL) 25 MG tablet Take 25 mg by mouth daily.     minocycline (MINOCIN) 100 MG capsule Take 100 mg by mouth 3 (three) times daily.     omeprazole (PRILOSEC) 20 MG capsule Take 1 capsule (20 mg total) by mouth daily. (Patient taking differently: Take 20 mg by mouth daily at 2 PM.) 90 capsule 3   oxyCODONE (ROXICODONE) 15 MG immediate release tablet Take 15 mg by mouth every 8 (eight) hours.     sertraline (ZOLOFT) 50 MG tablet Take 1 tablet (50 mg total) by mouth daily. 30 tablet 1   tamsulosin (FLOMAX) 0.4 MG CAPS capsule Take 1 capsule (0.4 mg total) by mouth daily at 2 PM. 30 capsule 6   No current facility-administered medications for this visit.     Musculoskeletal: Strength & Muscle Tone: Unable to assess due to telemedicine visit [place]: Unable to assess due to telemedicine visit Patient leans: Unable to assess due to telemedicine visit  Psychiatric Specialty Exam: Review of Systems  Psychiatric/Behavioral:  Negative for decreased concentration, dysphoric mood, hallucinations, self-injury, sleep disturbance and suicidal ideas. The patient is nervous/anxious. The patient is not hyperactive.    There were no vitals taken for this visit.There is no height or weight on file to calculate BMI.  General Appearance: Well  Groomed  Eye Contact:  Good  Speech:  Clear and Coherent and Normal Rate  Volume:  Normal  Mood:  Anxious and Euthymic  Affect:  Appropriate and Congruent  Thought Process:  Coherent, Goal Directed, and Descriptions of  Associations: Intact  Orientation:  Full (Time, Place, and Person)  Thought Content: WDL   Suicidal Thoughts:  No  Homicidal Thoughts:  No  Memory:  Immediate;   Good Recent;   Good Remote;   Fair  Judgement:  Good  Insight:  Fair  Psychomotor Activity:  Normal  Concentration:  Concentration: Good and Attention Span: Good  Recall:  Good  Fund of Knowledge: Fair  Language: Good  Akathisia:  No  Handed:  Right  AIMS (if indicated): not done  Assets:  Communication Skills Desire for Improvement Housing Social Support  ADL's:  Impaired  Cognition: WNL  Sleep:  Fair   Screenings: GAD-7    Flowsheet Row Video Visit from 06/06/2021 in [facility] Video Visit from 04/04/2021 in [facility] Video Visit from 01/31/2021 in [facility] Video Visit from 12/06/2020 in [facility] Video Visit from 10/13/2020 in [facility]  Total GAD-7 Score 16 15 20 20 19       PHQ2-9    Flowsheet Row Video Visit from 06/06/2021 in [facility] Video Visit from 04/04/2021 in [facility] Video Visit from 01/31/2021 in [facility] Video Visit from 12/06/2020 in [facility] Video Visit from 10/13/2020 in [facility]  PHQ-2 Total Score 5 4 1 4 4   PHQ-9 Total Score 18 12 -- 16 19      Flowsheet Row Video Visit from 06/06/2021 in [facility] Video Visit from 04/04/2021 in [facility] Video Visit from 01/31/2021 in [place] No Risk No Risk No Risk        Assessment and Plan:   Nilton Lave. Zoll is a 61 year old male with a past psychiatric history significant for bipolar 1 disorder, generalized anxiety disorder, and panic disorder who presents to [facility] via virtual video visit for follow-up and medication management.  Patient reports no issues with his current medication use but is interested in increasing his dose to manage his depressive symptoms and anxiety.  Provider informed patient to hold off on increasing his sertraline dosage to prevent from to avoid possible manic episode.  Provider to place patient on Latuda.  Patient to take Latuda 20 mg daily for 6 days followed by 40 mg daily for the management of his bipolar disorder.  Patient to continue taking sertraline for the management of his anxiety.  Patient was agreeable to recommendation.  Patient's medications to be e-prescribed to pharmacy of choice.  1. Bipolar I disorder, most recent episode depressed (HCC)  - lurasidone (LATUDA) 20 MG TABS tablet; Patient to take 1 tablet (20 mg total) for 6 days then continue taking 2 tablets (40 mg total) daily  Dispense: 60 tablet; Refill: 1  2. Generalized anxiety disorder Patient to continue taking sertraline 50 mg daily for the management of his generalized anxiety disorder  3. Panic disorder Patient to continue taking sertraline 50 mg daily for the management of his panic disorder  Patient to follow up in 6 weeks Provider spent a total of 20 minutes with the patient/reviewing patient's chart  [name], PA 06/06/2021, 5:57 PM

## 2021-06-08 ENCOUNTER — Telehealth (HOSPITAL_COMMUNITY): Payer: Self-pay

## 2021-06-08 NOTE — Telephone Encounter (Signed)
Received a call from [place] at [facility] who reviews the PA's regarding pt's Lurasidone (Latuda) 109m. He stated that he's going to Approve a 30 day supply of the 24mtablet and then when patient runs out of that provider needs to sent in Lurasidone (Latuda) 4059mo take 1 tab qd. Writer will then do a PA, per DanLinna Hoffor the 1 - 69m31mb.  WRITER CALLED PT TO ASK HIM TO NOTIFY US [place] HE RUNS OUT OF THE 20MG SO PROVIDER CAN THEN SEND IN THE 40MG; HOWEVER PT DIDN'T PICK UP WRITER LVM TO CALL ME BACK

## 2021-06-08 NOTE — Telephone Encounter (Signed)
WAITING ON DETERMINATION

## 2021-06-08 NOTE — Telephone Encounter (Signed)
RECEIVED FAX FROM [facility] ON [address] REQUESTING A PA ON PATIENT'S  LATUDA 20MG TABLET SENT FOR REVIEW  S/W [place] PA # [id number]

## 2021-06-09 ENCOUNTER — Telehealth (HOSPITAL_COMMUNITY): Payer: Self-pay | Admitting: *Deleted

## 2021-06-09 NOTE — Telephone Encounter (Signed)
[facility] [place]  APPROVED: lurasidone (LATUDA) 20 MG TABS   QUANTITY: 30  APPROVED FOR 1 MONTH FROM 06/08/21  TO  07/06/21

## 2021-06-14 ENCOUNTER — Other Ambulatory Visit (HOSPITAL_COMMUNITY): Payer: Self-pay | Admitting: Physician Assistant

## 2021-06-14 ENCOUNTER — Telehealth (HOSPITAL_COMMUNITY): Payer: Self-pay | Admitting: *Deleted

## 2021-06-14 DIAGNOSIS — F3130 Bipolar disorder, current episode depressed, mild or moderate severity, unspecified: Secondary | ICD-10-CM

## 2021-06-14 MED ORDER — LURASIDONE HCL 40 MG PO TABS: 40.0000 mg | ORAL_TABLET | Freq: Every day | ORAL | 1 refills | Status: DC

## 2021-06-14 NOTE — Telephone Encounter (Signed)
Call from patient who is unclear on what he needs from [place] but something pharmacy needs. I tried to call the pharmacy but the wait was lengthy and I gave up. Note in chart from [place] suggests what needs to happen is a new rx be called in for 40 mg one pill a day. Will notify provider to call in new rx and if a new PA needs to be done will respond to it.

## 2021-06-14 NOTE — Telephone Encounter (Signed)
Provider was contacted by Christian Guerra. Christian Bowens, RN regarding patient's medication. Patient's medication (Latuda) has been approved by his insurance. Provider to adjust patient's medication per note. Patient's medication to be e-prescribed to pharmacy of choice.

## 2021-06-14 NOTE — Progress Notes (Signed)
Provider was contacted by Glory Buff. Olevia Bowens, RN regarding patient's medication. Patient's medication (Latuda) has been approved by his insurance. Provider to adjust patient's medication per note. Patient's medication to be e-prescribed to pharmacy of choice.

## 2021-06-20 ENCOUNTER — Telehealth (HOSPITAL_COMMUNITY): Payer: Self-pay | Admitting: *Deleted

## 2021-06-20 NOTE — Telephone Encounter (Signed)
Christian Guerra APPROVED lurasidone (LATUDA) 40 MG TABS tablet  # 90/90 DAYS  EFFECTIVE: 06/19/2021  TO 06/19/2022  P.A. # [phone]

## 2021-06-23 ENCOUNTER — Telehealth (HOSPITAL_COMMUNITY): Payer: Self-pay

## 2021-06-23 ENCOUNTER — Other Ambulatory Visit (HOSPITAL_COMMUNITY): Payer: Self-pay

## 2021-06-23 NOTE — Telephone Encounter (Signed)
This patient called our office regarding his Trazodone. It's not listed in his medications or in the notes from his last visit with you on 06/06/21; however, the patient stated that he has been taking it and he's out of the medication. The patient uses [facility] on [address] in [place]. Can a new script be sent in for the Trazodone? Please review and advise. Thank you ?

## 2021-06-27 ENCOUNTER — Other Ambulatory Visit: Payer: Self-pay

## 2021-06-27 ENCOUNTER — Other Ambulatory Visit (HOSPITAL_COMMUNITY): Payer: Self-pay | Admitting: Physician Assistant

## 2021-06-27 DIAGNOSIS — F5105 Insomnia due to other mental disorder: Secondary | ICD-10-CM

## 2021-06-27 DIAGNOSIS — F99 Mental disorder, not otherwise specified: Secondary | ICD-10-CM

## 2021-06-27 MED ORDER — TRAZODONE HCL 50 MG PO TABS: 50.0000 mg | ORAL_TABLET | Freq: Every evening | ORAL | 1 refills | Status: DC | PRN

## 2021-06-27 NOTE — Progress Notes (Signed)
Provider was contacted by Christian Guerra, CMA regarding patient's refill of trazodone. Patient reported during his encounter on 04/04/2021 that he was not relying too much on trazodone for the management and has not been provided any new samples since then. Provider to refill samples and e-prescribe his medication to preferred pharmacy. ?

## 2021-06-27 NOTE — Telephone Encounter (Signed)
Provider was contacted by Latina Craver, CMA regarding patient's refill of trazodone. Patient's medication to be e-prescribed to pharmacy of choice.

## 2021-06-28 NOTE — Telephone Encounter (Signed)
NOTIFIED PATIENT °

## 2021-07-05 ENCOUNTER — Ambulatory Visit (INDEPENDENT_AMBULATORY_CARE_PROVIDER_SITE_OTHER): Payer: Medicaid Other | Admitting: Internal Medicine

## 2021-07-05 DIAGNOSIS — Z23 Encounter for immunization: Secondary | ICD-10-CM

## 2021-07-05 NOTE — Progress Notes (Signed)
Pt presents today for nurse visit for an injection. Per Dr. Carlean Purl, injection of Heplisav given IM in Right Deltoid today by Gillermina Hu RN. Denies any adverse reactions or discomfort. ?

## 2021-07-05 NOTE — Patient Instructions (Signed)
Hepatitis B Vaccine, Recombinant injection ?What is this medication? ?HEPATITIS B VACCINE (hep uh TAHY tis  B  VAK seen) is a vaccine. It is used to prevent an infection with the hepatitis B virus. ?This medicine may be used for other purposes; ask your health care provider or pharmacist if you have questions. ?COMMON BRAND NAME(S): Engerix-B, Engerix-B Pediatric, HEPLISAV-B, PreHevbrio, Recombivax HB, Recombivax HB Pediatric/Adolescent ?What should I tell my care team before I take this medication? ?They need to know if you have any of these conditions: ?fever, infection ?heart disease ?hepatitis B infection ?immune system problems ?kidney disease ?an unusual or allergic reaction to vaccines, yeast, other medicines, foods, dyes, or preservatives ?pregnant or trying to get pregnant ?breast-feeding ?How should I use this medication? ?This vaccine is for injection into a muscle. It is given by a health care professional. ?A copy of Vaccine Information Statements will be given before each vaccination. Read this sheet carefully each time. The sheet may change frequently. ?Talk to your pediatrician regarding the use of this medicine in children. While this drug may be prescribed for children as young as newborn for selected conditions, precautions do apply. ?Overdosage: If you think you have taken too much of this medicine contact a poison control center or emergency room at once. ?NOTE: This medicine is only for you. Do not share this medicine with others. ?What if I miss a dose? ?It is important not to miss your dose. Call your doctor or health care professional if you are unable to keep an appointment. ?What may interact with this medication? ?medicines that suppress your immune function like adalimumab, anakinra, infliximab ?medicines to treat cancer ?steroid medicines like prednisone or cortisone ?This list may not describe all possible interactions. Give your health care provider a list of all the medicines, herbs,  non-prescription drugs, or dietary supplements you use. Also tell them if you smoke, drink alcohol, or use illegal drugs. Some items may interact with your medicine. ?What should I watch for while using this medication? ?See your health care provider for all shots of this vaccine as directed. You must have 3 shots of this vaccine for protection from hepatitis B infection. Tell your doctor right away if you have any serious or unusual side effects after getting this vaccine. ?What side effects may I notice from receiving this medication? ?Side effects that you should report to your doctor or health care professional as soon as possible: ?allergic reactions like skin rash, itching or hives, swelling of the face, lips, or tongue ?breathing problems ?confused, irritated ?fast, irregular heartbeat ?flu-like syndrome ?numb, tingling pain ?seizures ?unusually weak or tired ?Side effects that usually do not require medical attention (report to your doctor or health care professional if they continue or are bothersome): ?diarrhea ?fever ?headache ?loss of appetite ?muscle pain ?nausea ?pain, redness, swelling, or irritation at site where injected ?tiredness ?This list may not describe all possible side effects. Call your doctor for medical advice about side effects. You may report side effects to FDA at 1-800-FDA-1088. ?Where should I keep my medication? ?This drug is given in a hospital or clinic and will not be stored at home. ?NOTE: This sheet is a summary. It may not cover all possible information. If you have questions about this medicine, talk to your doctor, pharmacist, or health care provider. ?? 2022 Elsevier/Gold Standard (2013-08-10 00:00:00) ? ?

## 2021-07-18 ENCOUNTER — Telehealth (INDEPENDENT_AMBULATORY_CARE_PROVIDER_SITE_OTHER): Payer: Medicaid Other | Admitting: Physician Assistant

## 2021-07-18 DIAGNOSIS — F410 Panic disorder [episodic paroxysmal anxiety] without agoraphobia: Secondary | ICD-10-CM | POA: Diagnosis not present

## 2021-07-18 DIAGNOSIS — F3130 Bipolar disorder, current episode depressed, mild or moderate severity, unspecified: Secondary | ICD-10-CM

## 2021-07-18 DIAGNOSIS — F411 Generalized anxiety disorder: Secondary | ICD-10-CM | POA: Diagnosis not present

## 2021-07-18 DIAGNOSIS — F99 Mental disorder, not otherwise specified: Secondary | ICD-10-CM

## 2021-07-18 DIAGNOSIS — F5105 Insomnia due to other mental disorder: Secondary | ICD-10-CM

## 2021-07-18 MED ORDER — LURASIDONE HCL 60 MG PO TABS: 60.0000 mg | ORAL_TABLET | Freq: Every day | ORAL | 2 refills | Status: DC

## 2021-07-18 MED ORDER — SERTRALINE HCL 50 MG PO TABS: 50.0000 mg | ORAL_TABLET | Freq: Every day | ORAL | 2 refills | Status: DC

## 2021-07-18 MED ORDER — TRAZODONE HCL 50 MG PO TABS: 50.0000 mg | ORAL_TABLET | Freq: Every evening | ORAL | 1 refills | Status: DC | PRN

## 2021-07-18 NOTE — Progress Notes (Signed)
BH MD/PA/NP OP Progress Note ? ?Virtual Visit via Telephone Note ? ?I connected with Christian Guerra on 07/18/21 at 11:30 AM EDT by telephone and verified that I am speaking with the correct person using two identifiers. ? ?Location: ?Patient: Home ?Provider: Clinic ?  ?I discussed the limitations, risks, security and privacy concerns of performing an evaluation and management service by telephone and the availability of in person appointments. I also discussed with the patient that there may be a patient responsible charge related to this service. The patient expressed understanding and agreed to proceed. ? ?Follow Up Instructions: ? ?I discussed the assessment and treatment plan with the patient. The patient was provided an opportunity to ask questions and all were answered. The patient agreed with the plan and demonstrated an understanding of the instructions. ?  ?The patient was advised to call back or seek an in-person evaluation if the symptoms worsen or if the condition fails to improve as anticipated. ? ?I provided 21 minutes of non-face-to-face time during this encounter. ? ?[name], PA  ? ? ?07/18/2021 6:53 PM ?Christian Guerra  ?MRN:  [id number] ? ?Chief Complaint:  ?Chief Complaint  ?Patient presents with  ? Follow-up  ? Medication Management  ? ?HPI:  ? ?Ehsan Klatt is a 60 year old male with a past psychiatric history significant for insomnia, bipolar 1 disorder, generalized anxiety disorder, and panic disorder who presents to [facility] via virtual telephone visit for follow-up and medication management.  Patient is currently being managed on the following medications: ? ?Sertraline 50 mg daily ?Lurasidone 40 mg daily ? ?Patient states that everything is going well with his medications and denies any adverse side effects.  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  Patient  reports that he will be undergoing surgery in his right eye later this week.  In regards to his medications, he states that the combination of Latuda and Zoloft are helpful in the management of his symptoms.  He reports that he is not ruminating about the past as frequently.  He endorses some depressive symptoms attributed to the condition of his right eye, but overall, he is very happy with the results of the medication.  Patient endorses some anxiety and rates his anxiety as 6-7 out of 10. A PHQ-9 screen was performed with the patient scoring an 8.  A GAD-7 screen was also performed with the patient scoring a 13. ? ?Patient is alert and oriented x4, calm, cooperative, and fully engaged in conversation during the encounter.  Patient states that he feels hopeful and upbeat.  Patient continues to remain optimistic.  Patient denies suicidal or homicidal ideations.  He further denies auditory or visual hallucinations and does not appear to be responding to internal/external stimuli.  Patient endorses fair sleep and receives on average 6 to 7 hours of sleep each night.  Patient endorses good appetite and eats on average 2 meals per day.  Patient denies alcohol consumption and illicit drug use.  Patient endorses tobacco use through dipping and chew.  Patient states that he uses a can of tobacco chew every 2 to 3 days. ? ?Visit Diagnosis:  ?  ICD-10-CM   ?1. Insomnia due to other mental disorder  F51.05 traZODone (DESYREL) 50 MG tablet  ? F99   ?  ?2. Bipolar I disorder, most recent episode depressed (HCC)  F31.30 sertraline (ZOLOFT) 50 MG tablet  ?  lurasidone 60 MG TABS  ?  ?3. Generalized anxiety disorder  F41.1 sertraline (ZOLOFT) 50 MG tablet  ?  ?4. Panic disorder  F41.0 sertraline (ZOLOFT) 50 MG tablet  ?  ? ? ?Past Psychiatric History:  ?Panic disorder ?Generalized anxiety disorder ?Insomnia ?Bipolar disorder ? ?Past Medical History:  ?Past Medical History:  ?Diagnosis Date  ? Anxiety   ? Bilateral swelling of  feet and ankles   ? Bruised kidney 09/10/2016  ? Chronic lower back pain   ? Chronic pain   ? Daily headache   ? Diastolic dysfunction with chronic heart failure ([place])   ? Fundic gland polyps of stomach, benign   ? GERD (gastroesophageal reflux disease)   ? Hepatitis C   ? "treated; got down to 0 load 6-8 years ago" (09/13/2016)  ? Hypertension   ? Memory difficulties   ? "in the last week or so" (09/13/2016)  ? Morbid obesity ([place])   ? Personal history of colonic adenoma 01/20/2013  ? Recurrent falls   ? "recently" (09/13/2016)  ? Sleep apnea   ? "wore mask at night before; couldn't tolerate it" (09/13/2016)  ? Wears glasses   ?  ?Past Surgical History:  ?Procedure Laterality Date  ? ANKLE FRACTURE SURGERY Bilateral 2001  ? APPENDECTOMY  1975  ? BIOPSY  11/11/2018  ? Procedure: BIOPSY;  Surgeon: Gatha Mayer, MD;  Location: [facility];  Service: Endoscopy;;  ? COLONOSCOPY    ? COLONOSCOPY WITH PROPOFOL N/A 11/11/2018  ? Procedure: COLONOSCOPY WITH PROPOFOL;  Surgeon: Gatha Mayer, MD;  Location: [facility];  Service: Endoscopy;  Laterality: N/A;  ? ESOPHAGOGASTRODUODENOSCOPY (EGD) WITH PROPOFOL N/A 11/11/2018  ? Procedure: ESOPHAGOGASTRODUODENOSCOPY (EGD) WITH PROPOFOL;  Surgeon: Gatha Mayer, MD;  Location: [facility];  Service: Endoscopy;  Laterality: N/A;  ? FOREARM SURGERY Left 2001  ? "chain saw injury"  ? POLYPECTOMY  11/11/2018  ? Procedure: POLYPECTOMY;  Surgeon: Gatha Mayer, MD;  Location: [facility];  Service: Endoscopy;;  ? [place] [facility]  ? ? ?Family Psychiatric History:  ?Patient denies a family history of psychiatric illness ? ?Family History:  ?Family History  ?Problem Relation Age of Onset  ? Hypertension Father   ?     old age--33  ? Other Father   ?     enlarged prostate  ? Colon cancer Neg Hx   ? ? ?Social History:  ?Social History  ? ?Socioeconomic History  ? Marital status: Single  ?  Spouse name: Not on file  ? Number of children: 0  ? Years of education: Not on  file  ? Highest education level: Not on file  ?Occupational History  ? Occupation: unemployeed  ?Tobacco Use  ? Smoking status: Never  ? Smokeless tobacco: Current  ?  Types: Snuff  ?Vaping Use  ? Vaping Use: Never used  ?Substance and Sexual Activity  ? Alcohol use: Not Currently  ?  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   ? Drug use: Not Currently  ?  Comment: 09/13/2016 "nothing for a long time; months"  ? Sexual activity: Not Currently  ?Other Topics Concern  ? Not on file  ?Social History Narrative  ? He is single and unemployed, he lives with his younger brother  ? He is a participant in the [facility] and an orange card program  ? Followed at [facility] for mental illness  ? 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 ?Social  Determinants of Health  ? ?Financial Resource Strain: High Risk  ? Difficulty of Paying Living Expenses: Hard  ?Food Insecurity: Food Insecurity Present  ? Worried About Charity fundraiser in the Last Year: Sometimes true  ? Ran Out of Food in the Last Year: Sometimes true  ?Transportation Needs: No Transportation Needs  ? Lack of Transportation (Medical): No  ? Lack of Transportation (Non-Medical): No  ?Physical Activity: Inactive  ? Days of Exercise per Week: 0 days  ? Minutes of Exercise per Session: 0 min  ?Stress: Stress Concern Present  ? Feeling of Stress : Very much  ?Social Connections: Socially Isolated  ? Frequency of Communication with Friends and Family: More than three times a week  ? Frequency of Social Gatherings with Friends and Family: Never  ? Attends Religious Services: Never  ? Active Member of Clubs or Organizations: No  ? Attends Archivist Meetings: Never  ? Marital Status: Never married  ? ? ?Allergies:  ?Allergies  ?Allergen Reactions  ? Codeine Itching  ? ? ?Metabolic Disorder Labs: ?Lab Results  ?Component Value Date  ? HGBA1C 5.5 03/03/2021  ? MPG 114 06/15/2016  ? MPG 108 09/23/2012  ? ?No results found for:  PROLACTIN ?Lab Results  ?Component Value Date  ? CHOL 88 03/03/2021  ? TRIG 149 03/03/2021  ? HDL 16 (A) 03/03/2021  ? CHOLHDL 4.4 06/05/2018  ? VLDL 32 (H) 07/02/2016  ? [place] 46 03/03/2021  ? [place] 77

## 2021-07-20 ENCOUNTER — Encounter (HOSPITAL_COMMUNITY): Payer: Self-pay | Admitting: Physician Assistant

## 2021-08-18 ENCOUNTER — Telehealth (HOSPITAL_COMMUNITY): Payer: Self-pay | Admitting: *Deleted

## 2021-08-18 NOTE — Telephone Encounter (Signed)
[employer]  APPROVED lurasidone 60 MG TABS  ? ?QUANTITY : 90 ? ?DURATION : 90 ? ?APPROVED FOR 12 MONTHS  ? ?EFFECTIVE : 08/14/21  TO   08/15/2022 ?

## 2021-08-21 ENCOUNTER — Other Ambulatory Visit (HOSPITAL_COMMUNITY): Payer: Self-pay | Admitting: Physician Assistant

## 2021-08-21 DIAGNOSIS — F3130 Bipolar disorder, current episode depressed, mild or moderate severity, unspecified: Secondary | ICD-10-CM

## 2021-09-21 ENCOUNTER — Encounter (HOSPITAL_COMMUNITY): Payer: Self-pay | Admitting: Physician Assistant

## 2021-09-21 ENCOUNTER — Telehealth (INDEPENDENT_AMBULATORY_CARE_PROVIDER_SITE_OTHER): Payer: Medicaid Other | Admitting: Physician Assistant

## 2021-09-21 DIAGNOSIS — F3130 Bipolar disorder, current episode depressed, mild or moderate severity, unspecified: Secondary | ICD-10-CM

## 2021-09-21 DIAGNOSIS — F5105 Insomnia due to other mental disorder: Secondary | ICD-10-CM | POA: Diagnosis not present

## 2021-09-21 DIAGNOSIS — F410 Panic disorder [episodic paroxysmal anxiety] without agoraphobia: Secondary | ICD-10-CM | POA: Diagnosis not present

## 2021-09-21 DIAGNOSIS — F411 Generalized anxiety disorder: Secondary | ICD-10-CM | POA: Diagnosis not present

## 2021-09-21 DIAGNOSIS — F99 Mental disorder, not otherwise specified: Secondary | ICD-10-CM

## 2021-09-21 MED ORDER — LURASIDONE HCL 80 MG PO TABS: 80.0000 mg | ORAL_TABLET | Freq: Every day | ORAL | 2 refills | Status: DC

## 2021-09-21 MED ORDER — TRAZODONE HCL 50 MG PO TABS: 50.0000 mg | ORAL_TABLET | Freq: Every evening | ORAL | 2 refills | Status: DC | PRN

## 2021-09-21 MED ORDER — SERTRALINE HCL 100 MG PO TABS: 100.0000 mg | ORAL_TABLET | Freq: Every day | ORAL | 2 refills | Status: DC

## 2021-09-21 NOTE — Progress Notes (Signed)
BH MD/PA/NP OP Progress Note  Virtual Visit via Video Note  I connected with Christian Guerra on 09/22/21 at 11:30 AM EDT by a video enabled telemedicine application and verified that I am speaking with the correct person using two identifiers.  Location: Patient: Home Provider: Clinic   I discussed the limitations of evaluation and management by telemedicine and the availability of in person appointments. The patient expressed understanding and agreed to proceed.  Follow Up Instructions:   I discussed the assessment and treatment plan with the patient. The patient was provided an opportunity to ask questions and all were answered. The patient agreed with the plan and demonstrated an understanding of the instructions.   The patient was advised to call back or seek an in-person evaluation if the symptoms worsen or if the condition fails to improve as anticipated.  I provided 18 minutes of non-face-to-face time during this encounter.  [name], PA   09/21/2021 2:01 PM Christian Guerra  MRN:  [id number]  Chief Complaint:  Chief Complaint  Patient presents with   Follow-up   Medication Management   HPI:   Christian Guerra is a 61 year old male with a past psychiatric history significant for insomnia, bipolar 1 disorder, generalized anxiety disorder, and panic disorder who presents to [facility] via virtual video visit for follow-up and medication management.  Patient is currently being managed on the following medications:  Sertraline 50 mg daily Lurasidone 60 mg daily Trazodone 50 mg at bedtime  Patient reports no issues or concerns regarding his current medication regimen but does express that he feels that his medications are still too low.  He endorses having endless bad thoughts he describes as a broken tape recorder.  Patient endorses depression stating that he remembers having a single episode yesterday.  During that day,  patient states that his mood improved towards the end of the day but he ended up having bad thoughts and dreams later that night.  Patient depressive episodes are characterized by the following symptoms: feelings of sadness hypersomnia, feelings of guilt/worthlessness, hopelessness, decreased concentration, and irritability.  Patient denies lack of motivation.  Patient also endorses anxiety but states that it has not been as bad as it was before.  Patient notes that he has been biting his fingernails less often as a result of his lower anxiety.  Patient rates his anxiety at 5 out of 10.  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  A PHQ-9 screen was performed with the patient scoring a 15.  A GAD-7 screen was also performed with the patient scoring a 10.  Patient is alert and oriented x4, calm, cooperative, and fully engaged in conversation during the encounter.  Patient describes his mood as neutral/blah.  Patient denies suicidal or homicidal ideations.  He further denies auditory or visual hallucinations and does not appear to be responding to internal/external stimuli.  Patient denies paranoia.  Patient endorses fair sleep and states that he has been receiving too much sleep and is averaging roughly 10 hours a night as well as napping during the day.  Patient endorses good appetite and eats on average 3 meals per day.  Patient denies alcohol consumption and illicit drug use.  Patient endorses tobacco use in the form of snuff.  Visit Diagnosis:    ICD-10-CM   1. Bipolar I disorder, most recent episode depressed (HCC)  F31.30 sertraline (ZOLOFT) 100 MG tablet  lurasidone (LATUDA) 80 MG TABS tablet    2. Generalized anxiety disorder  F41.1 sertraline (ZOLOFT) 100 MG tablet    3. Panic disorder  F41.0 sertraline (ZOLOFT) 100 MG tablet    4. Insomnia due to other mental disorder  F51.05 traZODone (DESYREL) 50 MG tablet   F99        Past Psychiatric History:  Panic disorder Generalized anxiety disorder Insomnia Bipolar disorder  Past Medical History:  Past Medical History:  Diagnosis Date   Anxiety    Bilateral swelling of feet and ankles    Bruised kidney 09/10/2016   Chronic lower back pain    Chronic pain    Daily headache    Diastolic dysfunction with chronic heart failure (HCC)    Fundic gland polyps of stomach, benign    GERD (gastroesophageal reflux disease)    Hepatitis C    "treated; got down to 0 load 6-8 years ago" (09/13/2016)   Hypertension    Memory difficulties    "in the last week or so" (09/13/2016)   Morbid obesity ([place])    Personal history of colonic adenoma 01/20/2013   Recurrent falls    "recently" (09/13/2016)   Sleep apnea    "wore mask at night before; couldn't tolerate it" (09/13/2016)   Wears glasses     Past Surgical History:  Procedure Laterality Date   ANKLE FRACTURE SURGERY Bilateral 2001   [place]   BIOPSY  11/11/2018   Procedure: BIOPSY;  Surgeon: Gatha Mayer, MD;  Location: [facility];  Service: Endoscopy;;   COLONOSCOPY     COLONOSCOPY WITH PROPOFOL N/A 11/11/2018   Procedure: COLONOSCOPY WITH PROPOFOL;  Surgeon: Gatha Mayer, MD;  [facility]: [facility];  Service: Endoscopy;  Laterality: N/A;   ESOPHAGOGASTRODUODENOSCOPY (EGD) WITH PROPOFOL N/A 11/11/2018   Procedure: ESOPHAGOGASTRODUODENOSCOPY (EGD) WITH PROPOFOL;  Surgeon: Gatha Mayer, MD;  [facility];  Service: Endoscopy;  Laterality: N/A;   FOREARM SURGERY Left 2001   "chain saw injury"   POLYPECTOMY  11/11/2018   Procedure: POLYPECTOMY;  Surgeon: Gatha Mayer, MD;  [facility];  Service: Endoscopy;;   TESTICLE TORSION REDUCTION  1980    Family Psychiatric History:  Patient denies a family history of psychiatric illness  Family History:  Family History  Problem Relation Age of Onset   Hypertension Father        old age--58   Other Father        enlarged  prostate   Colon cancer Neg Hx     Social History:  Social History   Socioeconomic History   Marital status: Single    Spouse name: Not on file   Number of children: 0   Years of education: Not on file   Highest education level: Not on file  Occupational History   Occupation: unemployeed  Tobacco Use   Smoking status: Never   Smokeless tobacco: Current    Types: Snuff  Vaping Use   Vaping Use: Never used  Substance and Sexual Activity   Alcohol use: Not Currently    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, not now, and non-smoker   Social Determinants of Radio broadcast assistant Strain: Not on file  Food Insecurity: Not on file  Transportation Needs: Not on file  Physical Activity: Not on file  Stress: Not on file  Social Connections: Not on file    Allergies:  Allergies  Allergen Reactions   Codeine Itching    Metabolic Disorder Labs: Lab Results  Component Value Date   HGBA1C 5.5 03/03/2021   MPG 114 06/15/2016   MPG 108 09/23/2012   No results found for: PROLACTIN Lab Results  Component Value Date   CHOL 88 03/03/2021   TRIG 149 03/03/2021   HDL 16 (A) 03/03/2021   CHOLHDL 4.4 06/05/2018   VLDL 32 (H) 07/02/2016   LDLCALC 46 03/03/2021   LDLCALC 77 06/05/2018   Lab Results  Component Value Date   TSH 0.20 (A) 03/03/2021   TSH 1.970 06/05/2018    Therapeutic Level Labs: No results found for: LITHIUM No results found for: VALPROATE No components found for:  CBMZ  Current Medications: Current Outpatient Medications  Medication Sig  Dispense Refill   lurasidone (LATUDA) 80 MG TABS tablet Take 1 tablet (80 mg total) by mouth daily with breakfast. 30 tablet 2   albuterol (VENTOLIN HFA) 108 (90 Base) MCG/ACT inhaler Inhale 1 puff into the lungs every 6 (six) hours as needed for wheezing or shortness of breath. 1 Inhaler 0   atenolol (TENORMIN) 50 MG tablet Take 1 tablet (50 mg total) by mouth daily. 30 tablet 2   cyclobenzaprine (FLEXERIL) 10 MG tablet Take 10 mg by mouth 3 (three) times daily as needed for muscle spasms.     gabapentin (NEURONTIN) 600 MG tablet Take 600 mg by mouth 3 (three) times daily.     hydrochlorothiazide (HYDRODIURIL) 25 MG tablet Take 25 mg by mouth daily.     lurasidone 60 MG TABS Take 1 tablet (60 mg total) by mouth daily with breakfast. 30 tablet 2   minocycline (MINOCIN) 100 MG capsule Take 100 mg by mouth 3 (three) times daily.     omeprazole (PRILOSEC) 20 MG capsule Take 1 capsule (20 mg total) by mouth daily. (Patient taking differently: Take 20 mg by mouth daily at 2 PM.) 90 capsule 3   oxyCODONE (ROXICODONE) 15 MG immediate release tablet Take 15 mg by mouth every 8 (eight) hours.     sertraline (ZOLOFT) 100 MG tablet Take 1 tablet (100 mg total) by mouth daily. 30 tablet 2   tamsulosin (FLOMAX) 0.4 MG CAPS capsule Take 1 capsule (0.4 mg total) by mouth daily at 2 PM. 30 capsule 6   traZODone (DESYREL) 50 MG tablet Take 1 tablet (50 mg total) by mouth at bedtime as needed for sleep. 30 tablet 2   No current facility-administered medications for this visit.     Musculoskeletal: Strength & Muscle Tone: Unable to assess due to telemedicine visit [place]: Unable to assess due to telemedicine visit Patient leans: Unable to assess due to telemedicine visit  Psychiatric Specialty Exam: Review of Systems  Psychiatric/Behavioral:  Positive for sleep disturbance. Negative for decreased concentration, dysphoric mood, hallucinations, self-injury and suicidal ideas. The patient is  nervous/anxious. The patient is not hyperactive.    There were no vitals taken for this visit.There is no height or weight on file to calculate BMI.  General Appearance: Unable to assess due to telemedicine visit  Eye Contact:  Unable to assess due to telemedicine visit  Speech:  Clear and Coherent and Normal Rate  Volume:  Normal  Mood:  Anxious and Depressed  Affect:  Congruent and Depressed  Thought Process:  Coherent, Goal Directed, and Descriptions of Associations: Intact  Orientation:  Full (Time, Place, and Person)  Thought Content: WDL   Suicidal Thoughts:  No  Homicidal Thoughts:  No  Memory:  Immediate;   Good Recent;   Good Remote;   Fair  Judgement:  Good  Insight:  Fair  Psychomotor Activity:  Normal  Concentration:  Concentration: Good and Attention Span: Good  Recall:  Good  Fund of Knowledge: Fair  Language: Good  Akathisia:  No  Handed:  Right  AIMS (if indicated): not done  Assets:  Communication Skills Desire for Improvement Housing Social Support  ADL's:  Impaired  Cognition: WNL  Sleep:  Fair   Screenings: GAD-7    Flowsheet Row Video Visit from 09/21/2021 in [facility] Video Visit from 07/18/2021 in [facility] Video Visit from 06/06/2021 in [facility] Video Visit from 04/04/2021 in [facility] Video Visit from 01/31/2021 in [facility]  Total GAD-7 Score 10 13 16 15 20       PHQ2-9    Flowsheet Row Video Visit from 09/21/2021 in [facility] Video Visit from 07/18/2021 in [facility] Video Visit from 06/06/2021 in [facility] Video Visit from 04/04/2021 in [facility] Video Visit from 01/31/2021 in [place]  PHQ-2 Total Score 2 2 5 4 1   PHQ-9 Total Score 15 8 18 12  --       Flowsheet Row Video Visit from 07/18/2021 in [facility] Video Visit from 06/06/2021 in [facility] Video Visit from 04/04/2021 in [place] Low Risk No Risk No Risk        Assessment and Plan:   Christian Guerra is a 61 year old male with a past psychiatric history significant for insomnia, bipolar 1 disorder, generalized anxiety disorder, and panic disorder who presents to [facility] via video telephone visit for follow-up and medication management.  Patient believes that his medications need to be adjusted due to experiencing depressive symptoms and some anxiety.  Patient was recommended increasing his dosage of Zoloft from 50 mg to 100 mg daily for the management of his anxiety.  Patient was also recommended increasing his dosage of Latuda from 60 mg to 80 mg daily for the management of his bipolar disorder.  Patient was agreeable to recommendations.  Patient's medications to be e-prescribed to pharmacy of choice.  Collaboration of Care: Collaboration of Care: Medication Management AEB provider managing patient's psychiatric medications, Psychiatrist AEB patient being followed by mental health provider, and Other provider involved in patient's care AEB patient being followed by gastroenterology  Patient/Guardian was advised Release of Information must be obtained prior to any record release in order to collaborate their care with an outside provider. Patient/Guardian was advised if they have not already done so to contact the registration department to sign all necessary forms in order for [place] to release information regarding their care.   Consent: Patient/Guardian gives verbal consent for treatment and assignment of benefits for services provided during this visit. Patient/Guardian expressed understanding and agreed to  proceed.   1. Bipolar I  disorder, most recent episode depressed (HCC)  - sertraline (ZOLOFT) 100 MG tablet; Take 1 tablet (100 mg total) by mouth daily.  Dispense: 30 tablet; Refill: 2 - lurasidone (LATUDA) 80 MG TABS tablet; Take 1 tablet (80 mg total) by mouth daily with breakfast.  Dispense: 30 tablet; Refill: 2  2. Generalized anxiety disorder  - sertraline (ZOLOFT) 100 MG tablet; Take 1 tablet (100 mg total) by mouth daily.  Dispense: 30 tablet; Refill: 2  3. Panic disorder  - sertraline (ZOLOFT) 100 MG tablet; Take 1 tablet (100 mg total) by mouth daily.  Dispense: 30 tablet; Refill: 2  4. Insomnia due to other mental disorder  - traZODone (DESYREL) 50 MG tablet; Take 1 tablet (50 mg total) by mouth at bedtime as needed for sleep.  Dispense: 30 tablet; Refill: 2  Patient to follow up in 2 months Provider spent a total of 18 minutes with the patient/reviewing patient's chart  [name], PA 09/21/2021, 2:01 PM

## 2021-10-26 ENCOUNTER — Ambulatory Visit (INDEPENDENT_AMBULATORY_CARE_PROVIDER_SITE_OTHER): Payer: Medicaid Other | Admitting: Surgery

## 2021-10-26 ENCOUNTER — Ambulatory Visit: Payer: Self-pay

## 2021-10-26 ENCOUNTER — Encounter: Payer: Self-pay | Admitting: Surgery

## 2021-10-26 VITALS — BP 133/70 | HR 77 | Ht 66.0 in | Wt 331.0 lb

## 2021-10-26 DIAGNOSIS — M7541 Impingement syndrome of right shoulder: Secondary | ICD-10-CM

## 2021-10-26 DIAGNOSIS — M25511 Pain in right shoulder: Secondary | ICD-10-CM | POA: Diagnosis not present

## 2021-10-26 NOTE — Progress Notes (Signed)
Office Visit Note   Patient: Christian Guerra           Date of Birth: [birth year]           MRN: [id number] Visit Date: 10/26/2021              Requested by: Christian Junior, MD [address] [place],  [place] [address] PCP: Christian Junior, MD   Assessment & Plan: Visit Diagnoses:  1. Acute pain of right shoulder   2. Impingement syndrome of right shoulder     Plan: In hopes of giving patient some improvement of his right shoulder pain offered injection.  Patient sent right shoulder is prepped with Betadine and subacromial Marcaine/Depo-Medrol injection was performed.  Follow-up with Christian Guerra in 4 weeks for recheck.  He can decide at that time as to whether or not MRI scan is indicated.  Follow-Up Instructions: Return in about 4 weeks (around 11/23/2021) for WITH DR Guerra RECHECK RIGHT SHOULDER.   Orders:  Orders Placed This Encounter  Procedures   XR Shoulder Right   No orders of the defined types were placed in this encounter.     Procedures: Large Joint Inj: R subacromial bursa on 10/26/2021 10:07 AM Indications: pain Details: 25 G 1.5 in needle, posterior approach Medications: 6 mL bupivacaine 0.25 %; 3 mL lidocaine 1 %; 40 mg methylPREDNISolone acetate 40 MG/ML Consent was given by the patient. Patient was prepped and draped in the usual sterile fashion.       Clinical Data: No additional findings.   Subjective: Chief Complaint  Patient presents with   Right Shoulder - Pain    HPI 61 year old white male is new patient to the clinic comes in today with complaints of chronic right shoulder pain.  Patient states that he has had right shoulder pain with overhead activity internal rotation behind his back for 13 to 14 years.  He was last seen by a physician in [place] and had shoulder injections.  He has lived in [place] for the last 10 years but has not seen a provider for his shoulder due to some insurance issues.  Pain is unchanged.  No  cervical radicular component.  He takes oxycodone 3 times daily that is prescribed by his PCP Christian Guerra.  Has also been using Tylenol and ibuprofen.  No relief with either medication. Review of Systems No current cardiopulmonary GI/GU  Objective: Vital Signs: BP 133/70   Pulse 77   Ht 5' 6"  (1.676 m)   Wt (!) 331 lb (150.1 kg)   BMI 53.42 kg/m   Physical Exam HENT:     Head: Normocephalic and atraumatic.  Neurological:     Mental Status: He is alert.     Ortho Exam  Specialty Comments:  No specialty comments available.  Imaging: No results found.   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) 50/12/3816   Diastolic dysfunction with chronic heart failure ([place])    Fall    Major depression 08/04/2013   Generalized anxiety disorder 05/21/2013   Personal history of colonic adenoma 01/20/2013   OSA (obstructive sleep apnea) 09/27/2012   Chronic back pain 09/19/2012   Chronic hepatitis C without hepatic coma ([place]) 09/19/2012   GERD (gastroesophageal reflux disease) 09/19/2012   HTN (hypertension) 09/19/2012   Past Medical History:  Diagnosis Date   Anxiety    Bilateral swelling of feet and ankles     Bruised kidney 09/10/2016   Chronic lower back pain    Chronic pain    Daily headache    Diastolic dysfunction with chronic heart failure (HCC)    Fundic gland polyps of stomach, benign    GERD (gastroesophageal reflux disease)    Hepatitis C    "treated; got down to 0 load 6-8 years ago" (09/13/2016)   Hypertension    Memory difficulties    "in the last week or so" (09/13/2016)   Morbid obesity ([place])    Personal history of colonic adenoma 01/20/2013   Recurrent falls    "recently" (09/13/2016)   Sleep apnea    "wore mask at night before; couldn't tolerate it" (09/13/2016)   Wears glasses     Family History  Problem Relation Age of Onset   Hypertension Father        old age--42   Other Father        enlarged prostate   Colon cancer Neg Hx     Past Surgical History:  Procedure Laterality Date   ANKLE FRACTURE SURGERY Bilateral 2001   [place]   BIOPSY  11/11/2018   Procedure: BIOPSY;  Surgeon: Gatha Mayer, MD;  [facility];  Service: Endoscopy;;   COLONOSCOPY     COLONOSCOPY WITH PROPOFOL N/A 11/11/2018   Procedure: COLONOSCOPY WITH PROPOFOL;  Surgeon: Gatha Mayer, MD;  [facility];  Service: Endoscopy;  Laterality: N/A;   ESOPHAGOGASTRODUODENOSCOPY (EGD) WITH PROPOFOL N/A 11/11/2018   Procedure: ESOPHAGOGASTRODUODENOSCOPY (EGD) WITH PROPOFOL;  Surgeon: Gatha Mayer, MD;  [facility];  Service: Endoscopy;  Laterality: N/A;   FOREARM SURGERY Left 2001   "chain saw injury"   POLYPECTOMY  11/11/2018   Procedure: POLYPECTOMY;  Surgeon: Gatha Mayer, MD;  [facility];  Service: Endoscopy;;   TESTICLE TORSION REDUCTION  1980   Social History   Occupational History   Occupation: unemployeed  Tobacco Use   Smoking status: Never   Smokeless tobacco: Current    Types: Snuff  Vaping Use   Vaping Use: Never used  Substance and Sexual Activity   Alcohol use: Not Currently    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    Drug use: Not  Currently    Comment: 09/13/2016 "nothing for a long time; months"   Sexual activity: Not Currently

## 2021-11-08 ENCOUNTER — Telehealth: Payer: Self-pay

## 2021-11-08 NOTE — Telephone Encounter (Signed)
I left [place] a detailed message to call us back and set up a Hep A vaccine #2  for 12/04/2021.

## 2021-11-08 NOTE — Telephone Encounter (Signed)
-----   Message from Carollee Nussbaumer E [place], [place] sent at 06/05/2021  1:14 PM EST ----- Call and set up his Hep A #2 shot, first one done 06/05/2021, so due 12/04/2021

## 2021-11-09 NOTE — Telephone Encounter (Signed)
Patient returned your call.

## 2021-11-10 NOTE — Telephone Encounter (Signed)
Left detailed message for patient to call our office back and schedule his last Hepatitis A vaccination on 12/04/21 or after.

## 2021-11-14 ENCOUNTER — Telehealth: Payer: Self-pay | Admitting: Internal Medicine

## 2021-11-14 NOTE — Telephone Encounter (Unsigned)
Left message for pt to call back  °

## 2021-11-14 NOTE — Telephone Encounter (Signed)
Received call from nurse to schedule injection. I edited previous note to say #3. Appointment is scheduled for 12-04-21 at 9am.

## 2021-11-15 NOTE — Telephone Encounter (Unsigned)
Left message for pt to call back  °

## 2021-11-16 NOTE — Telephone Encounter (Signed)
Pt stated that he has already scheduled appointment for injection: Appointment confirmed with pt. Pt verbalized understanding with all questions answered.

## 2021-11-16 NOTE — Telephone Encounter (Signed)
Left message for pt to call back  °

## 2021-11-21 ENCOUNTER — Ambulatory Visit (INDEPENDENT_AMBULATORY_CARE_PROVIDER_SITE_OTHER): Payer: Medicaid Other | Admitting: Orthopaedic Surgery

## 2021-11-21 ENCOUNTER — Encounter: Payer: Self-pay | Admitting: Orthopaedic Surgery

## 2021-11-21 DIAGNOSIS — M7541 Impingement syndrome of right shoulder: Secondary | ICD-10-CM | POA: Diagnosis not present

## 2021-11-21 NOTE — Progress Notes (Signed)
Office Visit Note   Patient: Christian Guerra           Date of Birth: Jul 17, 1960           MRN: [id number] Visit Date: 11/21/2021              Requested by: [name], MD [address] [place],  [place] [address] PCP: [name], MD   Assessment & Plan: Visit Diagnoses:  1. Impingement syndrome of right shoulder     Plan: Patient to return in a month we can consider either repeat injection in his shoulder versus MRI scan if his weight is below 350 pounds.  We will weigh him on return.  Follow-Up Instructions: Return in about 1 month (around 12/22/2021).   Orders:  No orders of the defined types were placed in this encounter.  No orders of the defined types were placed in this encounter.     Procedures: No procedures performed   Clinical Data: No additional findings.   Subjective: Chief Complaint  Patient presents with   Right Shoulder - Pain    HPI 61 year old male returns post injection by [name], PA-C on 10/26/2021 for the right shoulder.  He states he puts his arm up over his head he has significant problems with aching pain for several days.  He can use his hand at his side easily.  Problems with morbid obesity BMI of 43 and he has had problems with cardiac history bipolar disorder treated hepatitis C.  At 1 point BMI was greater than 60.  He states his weight currently is 360 and most MRI scan ordered weight limit is 350 pounds.  Patient states the shoulder aches he is on chronic oxycodone for his back pain.  Patient currently on oxycodone IR 15 mg 4 a day.  Review of Systems all the systems noncontributory to HPI.   Objective: Vital Signs: BP 109/74   Pulse 98   Ht 5' 6"  (1.676 m)   Wt (!) 331 lb (150.1 kg)   BMI 53.42 kg/m   Physical Exam morbidly obese in wheelchair with mass no respiratory distress.  Abdomen morbidly obese.  He is able to get his arm up over his head with pain.  Tenderness anteriorly over the biceps tendon  normal grip.  Biceps triceps is normal positive impingement right shoulder negative left shoulder.  Ortho Exam positive right shoulder impingement.  Elbow reaches full extension.  Specialty Comments:  No specialty comments available.  Imaging: No results found.   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   Herpes simplex type 1 antibody positive 05/15/2017   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   Current non-adherence to medical treatment 12/07/2016   Morbid obesity with BMI of 60.0-69.9, adult ([place]) 71/16/5790   Diastolic dysfunction with chronic heart failure ([place])    Fall  Major depression 08/04/2013   Generalized anxiety disorder 05/21/2013   Personal history of colonic adenoma 01/20/2013   OSA (obstructive sleep apnea) 09/27/2012   Chronic back pain 09/19/2012   Chronic hepatitis C without hepatic coma ([place]) 09/19/2012   GERD (gastroesophageal reflux disease) 09/19/2012   HTN (hypertension) 09/19/2012   Past Medical History:  Diagnosis Date   Anxiety    Bilateral swelling of feet and ankles    Bruised kidney 09/10/2016   Chronic lower back pain    Chronic pain    Daily headache    Diastolic dysfunction with chronic heart failure (HCC)    Fundic gland polyps of stomach, benign    GERD (gastroesophageal reflux disease)    Hepatitis C    "treated;  got down to 0 load 6-8 years ago" (09/13/2016)   Hypertension    Memory difficulties    "in the last week or so" (09/13/2016)   Morbid obesity ([place])    Personal history of colonic adenoma 01/20/2013   Recurrent falls    "recently" (09/13/2016)   Sleep apnea    "wore mask at night before; couldn't tolerate it" (09/13/2016)   Wears glasses     Family History  Problem Relation Age of Onset   Hypertension Father        old age--36   Other Father        enlarged prostate   Colon cancer Neg Hx     Past Surgical History:  Procedure Laterality Date   ANKLE FRACTURE SURGERY Bilateral 2001   [place]   BIOPSY  11/11/2018   Procedure: BIOPSY;  Surgeon: Gatha Mayer, MD;  Location: [facility];  Service: Endoscopy;;   COLONOSCOPY     COLONOSCOPY WITH PROPOFOL N/A 11/11/2018   Procedure: COLONOSCOPY WITH PROPOFOL;  Surgeon: Gatha Mayer, MD;  [facility];  Service: Endoscopy;  Laterality: N/A;   ESOPHAGOGASTRODUODENOSCOPY (EGD) WITH PROPOFOL N/A 11/11/2018   Procedure: ESOPHAGOGASTRODUODENOSCOPY (EGD) WITH PROPOFOL;  Surgeon: Gatha Mayer, MD;  [facility];  Service: Endoscopy;  Laterality: N/A;   FOREARM SURGERY Left 2001   "chain saw injury"   POLYPECTOMY  11/11/2018   Procedure: POLYPECTOMY;  Surgeon: Gatha Mayer, MD;  [facility];  Service: Endoscopy;;   TESTICLE TORSION REDUCTION  1980   Social History   Occupational History   Occupation: unemployeed  Tobacco Use   Smoking status: Never   Smokeless tobacco: Current    Types: Snuff  Vaping Use   Vaping Use: Never used  Substance and Sexual Activity   Alcohol use: Not Currently    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    Drug use: Not Currently    Comment: 09/13/2016 "nothing for a long time; months"   Sexual activity: Not Currently

## 2021-11-29 ENCOUNTER — Telehealth (INDEPENDENT_AMBULATORY_CARE_PROVIDER_SITE_OTHER): Payer: Medicaid Other | Admitting: Physician Assistant

## 2021-11-29 DIAGNOSIS — F410 Panic disorder [episodic paroxysmal anxiety] without agoraphobia: Secondary | ICD-10-CM

## 2021-11-29 DIAGNOSIS — F3130 Bipolar disorder, current episode depressed, mild or moderate severity, unspecified: Secondary | ICD-10-CM | POA: Diagnosis not present

## 2021-11-29 DIAGNOSIS — F5105 Insomnia due to other mental disorder: Secondary | ICD-10-CM | POA: Diagnosis not present

## 2021-11-29 DIAGNOSIS — F411 Generalized anxiety disorder: Secondary | ICD-10-CM

## 2021-11-29 DIAGNOSIS — F99 Mental disorder, not otherwise specified: Secondary | ICD-10-CM

## 2021-11-29 MED ORDER — SERTRALINE HCL 50 MG PO TABS: 150.0000 mg | ORAL_TABLET | Freq: Every day | ORAL | 2 refills | Status: DC

## 2021-11-29 MED ORDER — LURASIDONE HCL 120 MG PO TABS: 120.0000 mg | ORAL_TABLET | Freq: Every day | ORAL | 2 refills | Status: DC

## 2021-11-29 MED ORDER — TRAZODONE HCL 100 MG PO TABS: 100.0000 mg | ORAL_TABLET | Freq: Every evening | ORAL | 2 refills | Status: DC | PRN

## 2021-11-30 ENCOUNTER — Encounter (HOSPITAL_COMMUNITY): Payer: Self-pay | Admitting: Physician Assistant

## 2021-11-30 NOTE — Progress Notes (Signed)
BH MD/Christian Guerra/NP OP Progress Note  Virtual Visit via Video Note  I connected with Christian Guerra on 11/30/21 at 11:30 AM EDT by a video enabled telemedicine application and verified that I am speaking with the correct person using two identifiers.  Location: Patient: Home Provider: Clinic   I discussed the limitations of evaluation and management by telemedicine and the availability of in person appointments. The patient expressed understanding and agreed to proceed.  Follow Up Instructions:   I discussed the assessment and treatment plan with the patient. The patient was provided an opportunity to ask questions and all were answered. The patient agreed with the plan and demonstrated an understanding of the instructions.   The patient was advised to call back or seek an in-person evaluation if the symptoms worsen or if the condition fails to improve as anticipated.  I provided 21 minutes of non-face-to-face time during this encounter.  Christian Mood, Christian Guerra   11/30/2021 8:38 AM Christian Guerra  MRN:  [id number]  Chief Complaint:  No chief complaint on file.  HPI:   Christian Guerra is a 61 year old male with a past psychiatric history significant for insomnia, bipolar 1 disorder, generalized anxiety disorder, and panic disorder who presents to [facility] via virtual video visit for follow-up and medication management.  Patient is currently being managed on the following medications:  Sertraline 100 mg daily Lurasidone 80 mg daily Trazodone 50 mg at bedtime  Patient reports that his lurasidone has not been helpful in the management of his symptoms.  Patient reports that he has been eating a lot and also endorses bouts of hopelessness as well as [name].  Patient attributes his [name] to excess pain that he has been experiencing.  Patient continues to endorse depression stating that his depression is attributed to a lot of stressors  negatively impacting him.  Patient endorses elevated anxiety and denies wanting to be around people.  A PHQ-9 screen was performed with the patient scoring a 17.  A GAD-7 screen was also performed with the patient scoring a 16.  Patient is alert and oriented x4, calm, cooperative, and fully engaged in conversation during the encounter.  Patient describes being down and melancholic.  He feels hopeless and feels that a black cloud has been over him for the past month.  Patient denies suicidal or homicidal ideations.  He further denies auditory or visual hallucinations and does not appear to be responding to internal/external stimuli.  Patient endorses having a sporadic sleep cycle.  He reports that he normally receives 8 hours of sleep each night but states that recently there have been days where he has not been sleeping at all.  During these days, patient states that he tends to have a very busy schedule and ends up staying up the whole night after his busy day and sleeping the whole day the following day.  Patient endorses good appetite and eats on average 2 meals per day.  Patient denies alcohol consumption or illicit drug use.  Patient endorses tobacco use in the form of snuff.  Visit Diagnosis:    ICD-10-CM   1. Bipolar I disorder, most recent episode depressed (HCC)  F31.30 lurasidone 120 MG TABS    sertraline (ZOLOFT) 50 MG tablet    2. Generalized anxiety disorder  F41.1 sertraline (ZOLOFT) 50 MG tablet    3. Panic disorder  F41.0 sertraline (ZOLOFT) 50 MG tablet    4. Insomnia due to other mental disorder  F51.05 traZODone (DESYREL) 100 MG tablet   F99       Past Psychiatric History:  Panic disorder Generalized anxiety disorder Insomnia Bipolar disorder  Past Medical History:  Past Medical History:  Diagnosis Date   Anxiety    Bilateral swelling of feet and ankles    Bruised kidney 09/10/2016   Chronic lower back pain    Chronic pain    Daily headache    Diastolic dysfunction  with chronic heart failure (HCC)    Fundic gland polyps of stomach, benign    GERD (gastroesophageal reflux disease)    Hepatitis C    "treated; got down to 0 load 6-8 years ago" (09/13/2016)   Hypertension    Memory difficulties    "in the last week or so" (09/13/2016)   Morbid obesity ([place])    Personal history of colonic adenoma 01/20/2013   Recurrent falls    "recently" (09/13/2016)   Sleep apnea    "wore mask at night before; couldn't tolerate it" (09/13/2016)   Wears glasses     Past Surgical History:  Procedure Laterality Date   ANKLE FRACTURE SURGERY Bilateral 2001   [place]   BIOPSY  11/11/2018   Procedure: BIOPSY;  Surgeon: Gatha Mayer, MD;  [facility];  Service: Endoscopy;;   COLONOSCOPY     COLONOSCOPY WITH PROPOFOL N/A 11/11/2018   Procedure: COLONOSCOPY WITH PROPOFOL;  Surgeon: Gatha Mayer, MD;  [facility];  Service: Endoscopy;  Laterality: N/A;   ESOPHAGOGASTRODUODENOSCOPY (EGD) WITH PROPOFOL N/A 11/11/2018   Procedure: ESOPHAGOGASTRODUODENOSCOPY (EGD) WITH PROPOFOL;  Surgeon: Gatha Mayer, MD;  [facility];  Service: Endoscopy;  Laterality: N/A;   FOREARM SURGERY Left 2001   "chain saw injury"   POLYPECTOMY  11/11/2018   Procedure: POLYPECTOMY;  Surgeon: Gatha Mayer, MD;  [facility];  Service: Endoscopy;;   TESTICLE TORSION REDUCTION  1980    Family Psychiatric History:  Patient denies a family history of psychiatric illness  Family History:  Family History  Problem Relation Age of Onset   Hypertension Father        old age--61   Other Father        enlarged prostate   Colon cancer Neg Hx     Social History:  Social History   Socioeconomic History   Marital status: Single    Spouse name: Not on file   Number of children: 0   Years of education: Not on file   Highest education level: Not on file  Occupational History   Occupation: unemployeed  Tobacco Use   Smoking status: Never    Smokeless tobacco: Current    Types: Snuff  Vaping Use   Vaping Use: Never used  Substance and Sexual Activity   Alcohol use: Not Currently    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, not now, and non-smoker   Social Determinants of Health   Financial Resource Strain: High Risk (08/30/2020)   Overall Financial Resource Strain (CARDIA)  Difficulty of Paying Living Expenses: Hard  Food Insecurity: Food Insecurity Present (08/30/2020)   Hunger Vital Sign    Worried About Running Out of Food in the Last Year: Sometimes true    Ran Out of Food in the Last Year: Sometimes true  Transportation Needs: No Transportation Needs (08/30/2020)   PRAPARE - Hydrologist (Medical): No    Lack of Transportation (Non-Medical): No  Physical Activity: Inactive (08/30/2020)   Exercise Vital Sign    Days of Exercise per Week: 0 days    Minutes of Exercise per Session: 0 min  Stress: Stress Concern Present (08/30/2020)   [place]    Feeling of Stress : Very much  Social Connections: Socially Isolated (08/30/2020)   Social Connection and Isolation Panel [NHANES]    Frequency of Communication with Friends and Family: More than three times a week    Frequency of Social Gatherings with Friends and Family: Never    Attends Religious Services: Never    Marine scientist or Organizations: No    Attends Archivist Meetings: Never    Marital Status: Never married    Allergies:  Allergies   Allergen Reactions   Codeine Itching    Metabolic Disorder Labs: Lab Results  Component Value Date   HGBA1C 5.5 03/03/2021   MPG 114 06/15/2016   MPG 108 09/23/2012   No results found for: "PROLACTIN" Lab Results  Component Value Date   CHOL 88 03/03/2021   TRIG 149 03/03/2021   HDL 16 (A) 03/03/2021   CHOLHDL 4.4 06/05/2018   VLDL 32 (H) 07/02/2016   LDLCALC 46 03/03/2021   LDLCALC 77 06/05/2018   Lab Results  Component Value Date   TSH 0.20 (A) 03/03/2021   TSH 1.970 06/05/2018    Therapeutic Level Labs: No results found for: "LITHIUM" No results found for: "VALPROATE" No results found for: "CBMZ"  Current Medications: Current Outpatient Medications  Medication Sig Dispense Refill   albuterol (VENTOLIN HFA) 108 (90 Base) MCG/ACT inhaler Inhale 1 puff into the lungs every 6 (six) hours as needed for wheezing or shortness of breath. 1 Inhaler 0   atenolol (TENORMIN) 50 MG tablet Take 1 tablet (50 mg total) by mouth daily. 30 tablet 2   cyclobenzaprine (FLEXERIL) 10 MG tablet Take 10 mg by mouth 3 (three) times daily as needed for muscle spasms.     gabapentin (NEURONTIN) 600 MG tablet Take 600 mg by mouth 3 (three) times daily.     hydrochlorothiazide (HYDRODIURIL) 25 MG tablet Take 25 mg by mouth daily.     lurasidone 120 MG TABS Take 1 tablet (120 mg total) by mouth daily with breakfast. 30 tablet 2   minocycline (MINOCIN) 100 MG capsule Take 100 mg by mouth 3 (three) times daily.     omeprazole (PRILOSEC) 20 MG capsule Take 1 capsule (20 mg total) by mouth daily. (Patient taking differently: Take 20 mg by mouth daily at 2 PM.) 90 capsule 3   oxyCODONE (ROXICODONE) 15 MG immediate release tablet Take 15 mg by mouth every 8 (eight) hours.     sertraline (ZOLOFT) 50 MG tablet Take 3 tablets (150 mg total) by mouth daily. 90 tablet 2   tamsulosin (FLOMAX) 0.4 MG CAPS capsule Take 1 capsule (0.4 mg total) by mouth daily at 2 PM. 30 capsule 6   traZODone (DESYREL) 100 MG  tablet Take 1 tablet (100 mg total) by mouth  at bedtime as needed for sleep. 30 tablet 2   No current facility-administered medications for this visit.     Musculoskeletal: Strength & Muscle Tone: Unable to assess due to telemedicine visit [place]: Unable to assess due to telemedicine visit Patient leans: Unable to assess due to telemedicine visit  Psychiatric Specialty Exam: Review of Systems  Psychiatric/Behavioral:  Positive for sleep disturbance. Negative for decreased concentration, dysphoric Guerra, hallucinations, self-injury and suicidal ideas. The patient is nervous/anxious. The patient is not hyperactive.     There were no vitals taken for this visit.There is no height or weight on file to calculate BMI.  General Appearance: Unable to assess due to telemedicine visit  Eye Contact:  Unable to assess due to telemedicine visit  Speech:  Clear and Coherent and Normal Rate  Volume:  Normal  Guerra:  Anxious and Depressed  Affect:  Congruent and Depressed  Thought Process:  Coherent, Goal Directed, and Descriptions of Associations: Intact  Orientation:  Full (Time, Place, and Person)  Thought Content: WDL   Suicidal Thoughts:  No  Homicidal Thoughts:  No  Memory:  Immediate;   Good Recent;   Good Remote;   Fair  Judgement:  Good  Insight:  Fair  Psychomotor Activity:  Normal  Concentration:  Concentration: Good and Attention Span: Good  Recall:  Good  Fund of Knowledge: Fair  Language: Good  Akathisia:  No  Handed:  Right  AIMS (if indicated): not done  Assets:  Communication Skills Desire for Improvement Housing Social Support  ADL's:  Impaired  Cognition: WNL  Sleep:  Fair   Screenings: GAD-7    Flowsheet Row Video Visit from 11/29/2021 in [facility] Video Visit from 09/21/2021 in [facility] Video Visit from 07/18/2021 in [facility] Video Visit from 06/06/2021 in [facility] Video Visit from 04/04/2021 in [facility]  Total GAD-7 Score 16 10 13 16 15       PHQ2-9    Flowsheet Row Video Visit from 11/29/2021 in [facility] Video Visit from 09/21/2021 in [facility] Video Visit from 07/18/2021 in [facility] Video Visit from 06/06/2021 in [facility] Video Visit from 04/04/2021 in [place]  PHQ-2 Total Score 5 2 2 5 4   PHQ-9 Total Score 17 15 8 18 12       Flowsheet Row Video Visit from 11/29/2021 in [facility] Video Visit from 07/18/2021 in [facility] Video Visit from 06/06/2021 in [place] Low Risk Low Risk No Risk        Assessment and Plan:   Klyde Banka is a 61 year old male with a past psychiatric history significant for insomnia, bipolar 1 disorder, generalized anxiety disorder, and panic disorder who presents to [facility] via virtual video visit for follow-up and medication management.  Patient continues to express the ineffectiveness of his lurasidone.  Patient endorses [name], hopelessness, anxiety, and depressive symptoms.  Provider recommended increasing patient's lurasidone from 80 mg to 120 mg daily for the management of his bipolar disorder.  Provider also recommended increasing his sertraline from 100 mg to 150 mg for the management of his anxiety.  Lastly, patient was recommended increasing his trazodone from 50 mg to 100 mg at bedtime for the  management of his recent sleep disturbances.  Patient was agreeable to recommendations.  Patient's medication to be prescribed to pharmacy of choice.  Collaboration of Care: Collaboration of Care: Medication Management AEB provider managing patient's  psychiatric medications, Psychiatrist AEB patient being followed by mental health provider, and Other provider involved in patient's care AEB patient being followed by gastroenterology  Patient/Guardian was advised Release of Information must be obtained prior to any record release in order to collaborate their care with an outside provider. Patient/Guardian was advised if they have not already done so to contact the registration department to sign all necessary forms in order for [place] to release information regarding their care.   Consent: Patient/Guardian gives verbal consent for treatment and assignment of benefits for services provided during this visit. Patient/Guardian expressed understanding and agreed to proceed.   1. Bipolar I disorder, most recent episode depressed (HCC)  - lurasidone 120 MG TABS; Take 1 tablet (120 mg total) by mouth daily with breakfast.  Dispense: 30 tablet; Refill: 2 - sertraline (ZOLOFT) 50 MG tablet; Take 3 tablets (150 mg total) by mouth daily.  Dispense: 90 tablet; Refill: 2  2. Generalized anxiety disorder  - sertraline (ZOLOFT) 50 MG tablet; Take 3 tablets (150 mg total) by mouth daily.  Dispense: 90 tablet; Refill: 2  3. Panic disorder  - sertraline (ZOLOFT) 50 MG tablet; Take 3 tablets (150 mg total) by mouth daily.  Dispense: 90 tablet; Refill: 2  4. Insomnia due to other mental disorder  - traZODone (DESYREL) 100 MG tablet; Take 1 tablet (100 mg total) by mouth at bedtime as needed for sleep.  Dispense: 30 tablet; Refill: 2  Patient to follow up in 2 months Provider spent a total of 21 minutes with the patient/reviewing patient's chart  Christian Mood, Christian Guerra 11/30/2021, 8:38 AM

## 2021-12-04 ENCOUNTER — Ambulatory Visit (INDEPENDENT_AMBULATORY_CARE_PROVIDER_SITE_OTHER): Payer: Medicaid Other | Admitting: Internal Medicine

## 2021-12-04 DIAGNOSIS — Z23 Encounter for immunization: Secondary | ICD-10-CM

## 2021-12-26 ENCOUNTER — Ambulatory Visit: Payer: Medicaid Other | Admitting: Orthopaedic Surgery

## 2022-01-01 ENCOUNTER — Ambulatory Visit (INDEPENDENT_AMBULATORY_CARE_PROVIDER_SITE_OTHER): Payer: Medicaid Other | Admitting: Podiatry

## 2022-01-01 ENCOUNTER — Encounter: Payer: Self-pay | Admitting: Podiatry

## 2022-01-01 DIAGNOSIS — L97919 Non-pressure chronic ulcer of unspecified part of right lower leg with unspecified severity: Secondary | ICD-10-CM

## 2022-01-01 DIAGNOSIS — I83229 Varicose veins of left lower extremity with both ulcer of unspecified site and inflammation: Secondary | ICD-10-CM | POA: Diagnosis not present

## 2022-01-01 DIAGNOSIS — I83219 Varicose veins of right lower extremity with both ulcer of unspecified site and inflammation: Secondary | ICD-10-CM

## 2022-01-01 DIAGNOSIS — L97515 Non-pressure chronic ulcer of other part of right foot with muscle involvement without evidence of necrosis: Secondary | ICD-10-CM | POA: Diagnosis not present

## 2022-01-01 DIAGNOSIS — L309 Dermatitis, unspecified: Secondary | ICD-10-CM

## 2022-01-01 DIAGNOSIS — L97929 Non-pressure chronic ulcer of unspecified part of left lower leg with unspecified severity: Secondary | ICD-10-CM

## 2022-01-01 NOTE — Progress Notes (Signed)
Subjective:   Patient ID: Christian Guerra, male   DOB: 61 y.o.   MRN: [id number]   HPI Patient presents with chronic discoloration of feet and lower legs bilateral of several years duration and bilateral tingling burning and stiffness of his feet of a number of years.  Patient does watch A1c and does have extreme obesity which is complicating factor.  Patient does not currently smoke is not active   Review of Systems  All other systems reviewed and are negative.       Objective:  Physical Exam Vitals and nursing note reviewed.  Constitutional:      Appearance: He is well-developed.  Pulmonary:     Effort: Pulmonary effort is normal.  Musculoskeletal:        General: Normal range of motion.  Skin:    General: Skin is warm.  Neurological:     Mental Status: He is alert.     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  Did explain to him obesity and also discussed diabetic neuropathy and daily foot inspections

## 2022-01-07 IMAGING — CT CT ABDOMEN W/ CM
3 of 10 series · 11 of 46 positions shown, 17 images · IV contrast (APPLIED)
Comparison: CT October 23, 2018 and MRI November 02, 2018.

CLINICAL DATA: Elevated alkaline phosphatase levels, right upper
quadrant discomfort.

EXAM:
CT ABDOMEN WITH CONTRAST
TECHNIQUE: Multidetector CT imaging of the abdomen was performed using the
standard protocol following bolus administration of intravenous
contrast.
CONTRAST:  100mL OMNIPAQUE IOHEXOL 350 MG/ML SOLN

[Series 2: axial arterial · axial · arterial · 0.98mm/px · z∈[-137,-59]mm · 3 of 93 slices shown]
[im 14/93  soft-tissue]
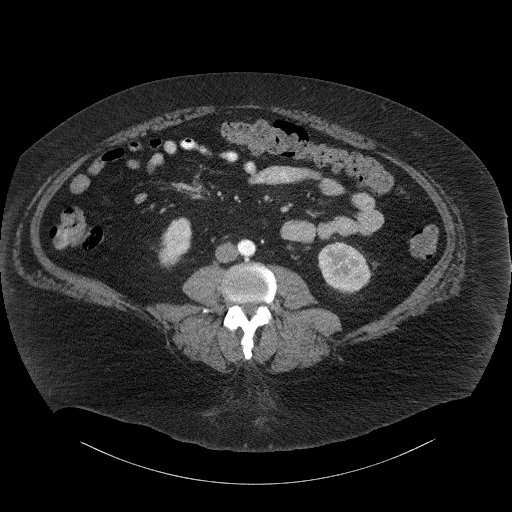
[im 27/93  soft-tissue]
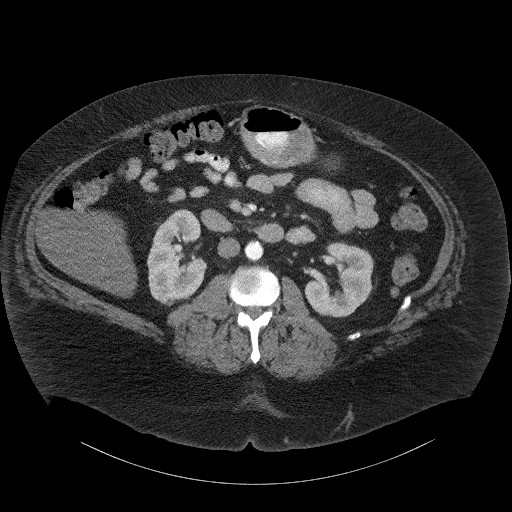
[im 40/93  soft-tissue]
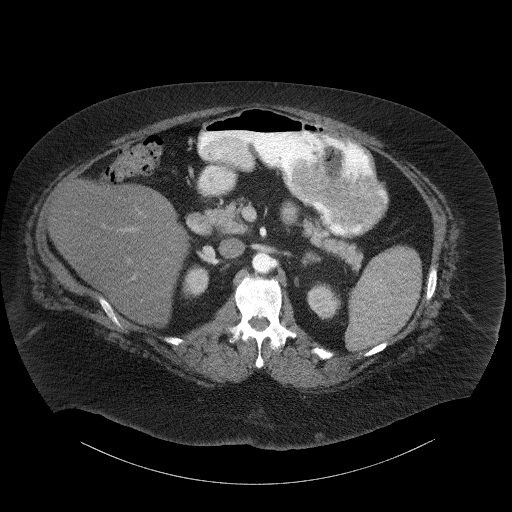

[Series 4: axial venous · axial · portal-venous · 0.98mm/px · z∈[-137,+58]mm · 6 of 93 slices shown, 11 images]
[im 14/93  soft-tissue]
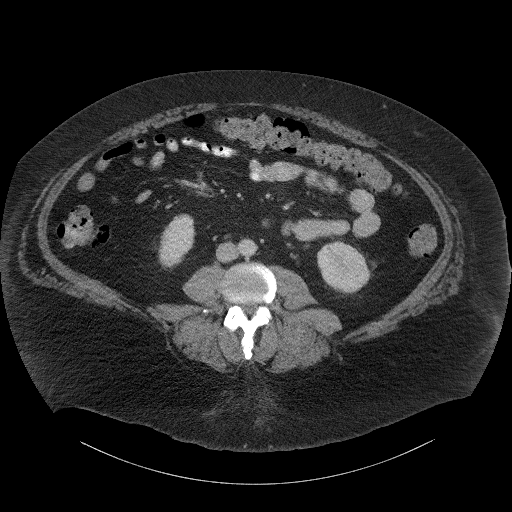
[im 14/93  bone]
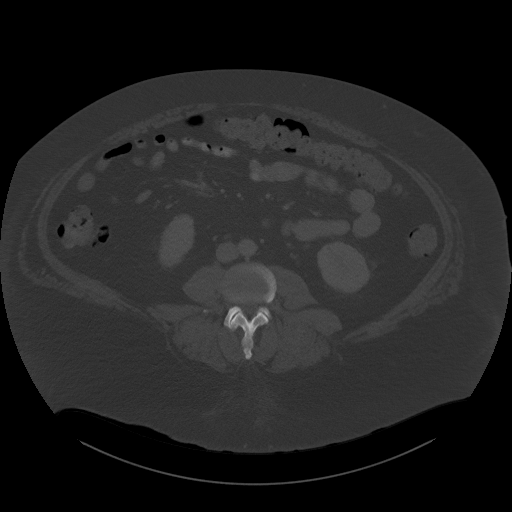
[im 27/93  soft-tissue]
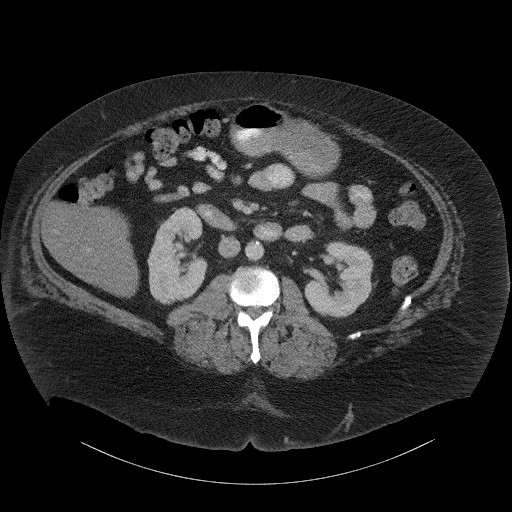
[im 40/93  soft-tissue]
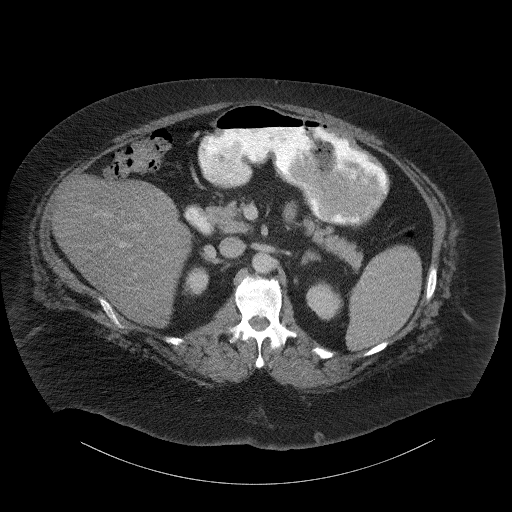
[im 40/93  lung]
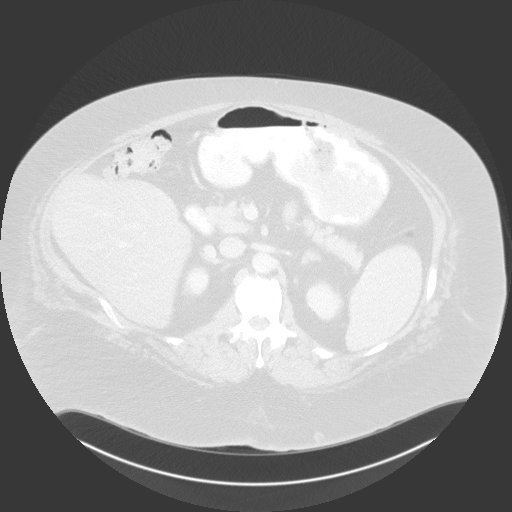
[im 53/93  soft-tissue]
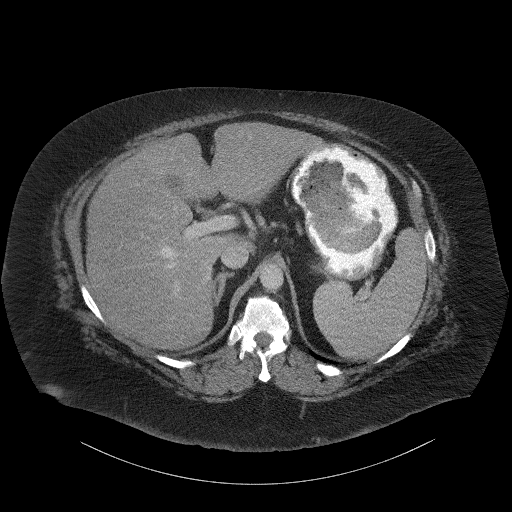
[im 53/93  lung]
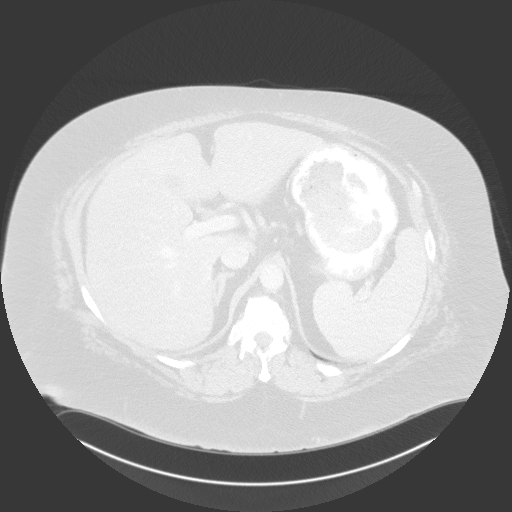
[im 66/93  soft-tissue]
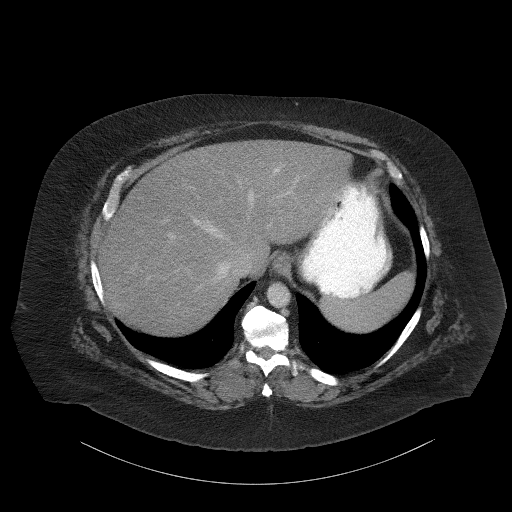
[im 66/93  lung]
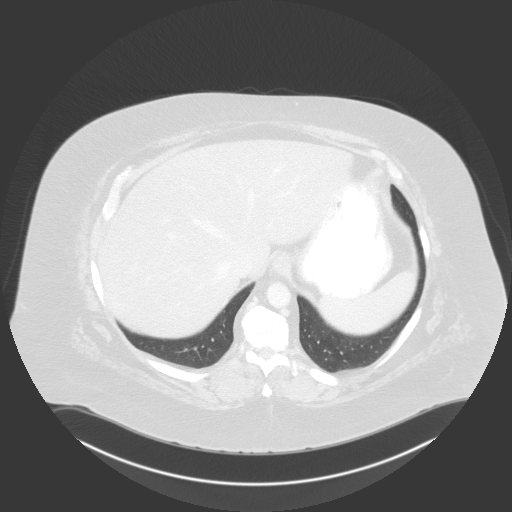
[im 79/93  soft-tissue]
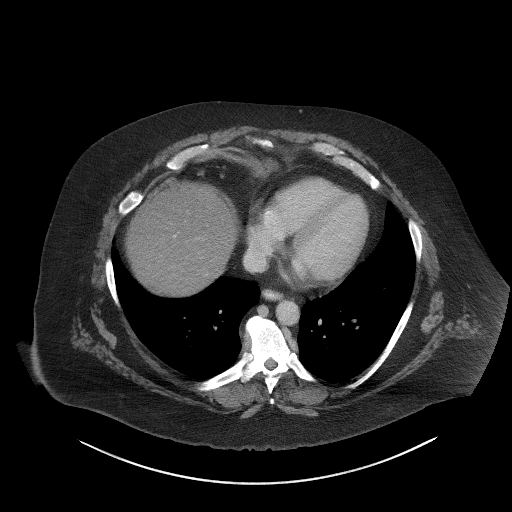
[im 79/93  lung]
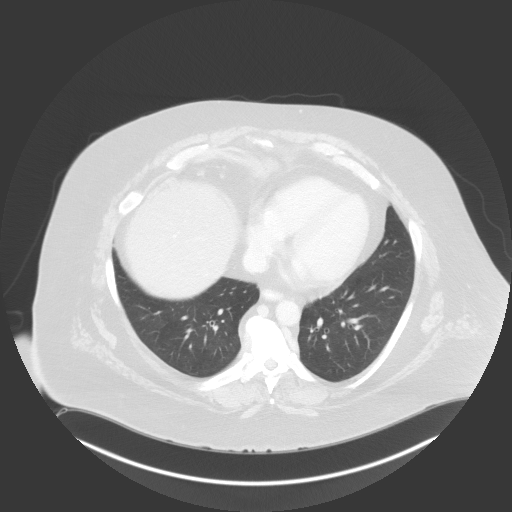

[Series 10: coronal venous · coronal · portal-venous · 0.54mm/px · 2 of 102 slices shown, 3 images]
[im 34/102  soft-tissue]
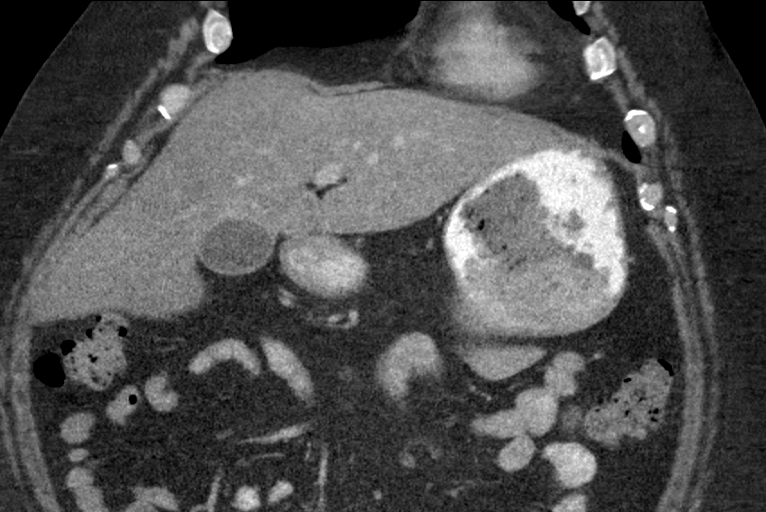
[im 34/102  bone]
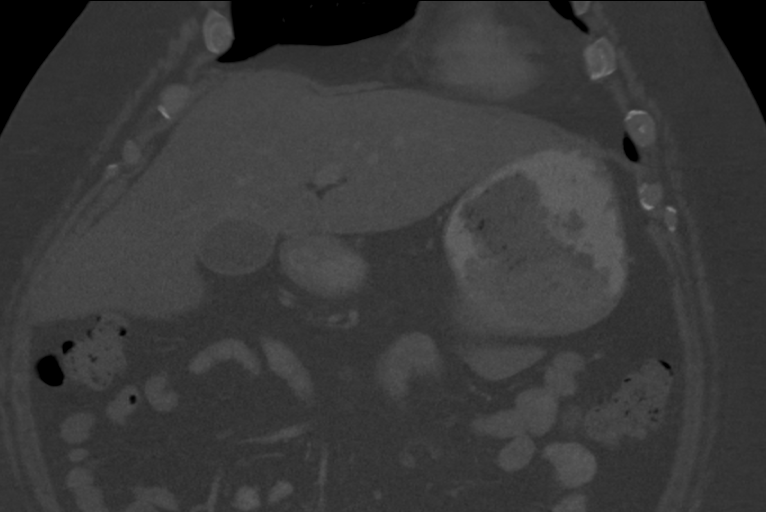
[im 68/102  soft-tissue]
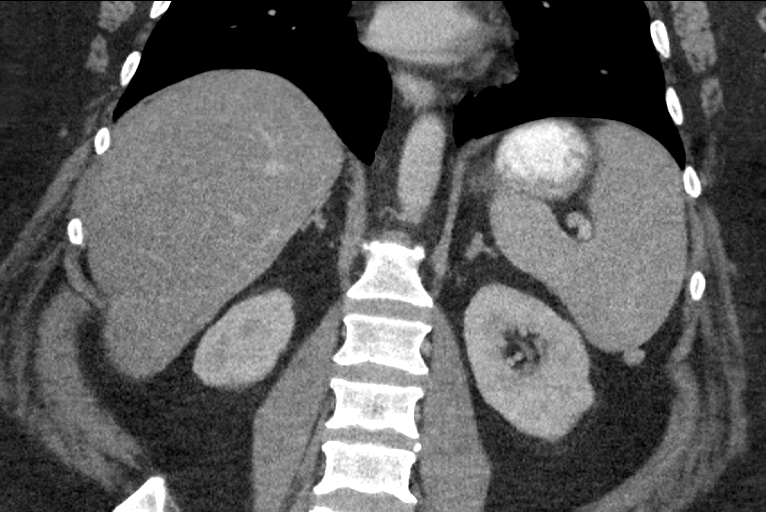

[11 of 46 positions shown; findings below may reference images not displayed]

FINDINGS: Lower chest: No acute abnormality.

Hepatobiliary: Hepatic steatosis with focal fatty sparing along the
gallbladder fossa. Widening of the hepatic fissures with enlargement
of the caudate lobe of the liver suggestive of cirrhosis. No
suspicious hepatic lesion. Cholelithiasis without findings of acute
cholecystitis. No biliary ductal dilation.

Pancreas: No pancreatic ductal dilation or evidence of acute
inflammation.

Spleen: Mild splenomegaly measuring 14.2 cm in maximum axial
dimension.

Adrenals/Urinary Tract: Bilateral adrenal glands are unremarkable.
No hydronephrosis. No solid enhancing renal mass. Kidneys
demonstrate symmetric enhancement and excretion of contrast
material.

Stomach/Bowel: Radiopaque enteric contrast material traverses distal
loops of small bowel. Stomach is unremarkable for degree of
distension. There is reflux versus retained contrast in the distal
esophagus. No pathologic dilation or evidence of acute inflammation
involving loops of large or small bowel in the abdomen.

Vascular/Lymphatic: Aortic atherosclerosis. No abdominal aortic
aneurysm. The portal, splenic and superior mesenteric veins are
patent. No pathologically enlarged abdominal lymph nodes.

Other: No abdominal ascites.

Musculoskeletal: Thoracic diffuse idiopathic skeletal hyperostosis.
Mild lumbar spondylosis.
IMPRESSION: 1. Hepatic steatosis with focal fatty sparing along the gallbladder
fossa.
2. Widening of the hepatic fissures with enlargement of the caudate
lobe of the liver suggestive of cirrhosis. No suspicious hepatic
lesion.
3. Mild splenomegaly of indeterminate clinical significance but
possibly reflecting portal hypertension.
4. Cholelithiasis without findings of acute cholecystitis.
5. Retained versus refluxed contrast in the distal esophagus may
reflect gastroesophageal reflux.
6.  Aortic Atherosclerosis ([id number]).

## 2022-01-14 MED ORDER — METHYLPREDNISOLONE ACETATE 40 MG/ML IJ SUSP
40.0000 mg | INTRAMUSCULAR | Status: AC | PRN
  Administered 2021-10-26: 40 mg via INTRA_ARTICULAR

## 2022-01-14 MED ORDER — BUPIVACAINE HCL 0.25 % IJ SOLN
6.0000 mL | INTRAMUSCULAR | Status: AC | PRN
  Administered 2021-10-26: 6 mL via INTRA_ARTICULAR

## 2022-01-14 MED ORDER — LIDOCAINE HCL 1 % IJ SOLN
3.0000 mL | INTRAMUSCULAR | Status: AC | PRN
  Administered 2021-10-26: 3 mL

## 2022-01-19 ENCOUNTER — Encounter (HOSPITAL_COMMUNITY): Payer: Self-pay

## 2022-01-23 ENCOUNTER — Ambulatory Visit: Payer: Medicaid Other | Admitting: Orthopaedic Surgery

## 2022-02-01 ENCOUNTER — Telehealth (HOSPITAL_COMMUNITY): Payer: No Payment, Other | Admitting: Physician Assistant

## 2022-02-21 ENCOUNTER — Ambulatory Visit: Payer: Medicaid Other | Admitting: Orthopaedic Surgery

## 2022-03-13 ENCOUNTER — Ambulatory Visit (INDEPENDENT_AMBULATORY_CARE_PROVIDER_SITE_OTHER): Payer: Managed Care, Other (non HMO) | Admitting: Orthopaedic Surgery

## 2022-03-13 VITALS — BP 120/75 | HR 90 | Ht 66.0 in | Wt 375.0 lb

## 2022-03-13 DIAGNOSIS — M7541 Impingement syndrome of right shoulder: Secondary | ICD-10-CM | POA: Diagnosis not present

## 2022-03-13 MED ORDER — LIDOCAINE HCL 1 % IJ SOLN
0.5000 mL | INTRAMUSCULAR | Status: AC | PRN
  Administered 2022-03-13: .5 mL

## 2022-03-13 MED ORDER — METHYLPREDNISOLONE ACETATE 40 MG/ML IJ SUSP
40.0000 mg | INTRAMUSCULAR | Status: AC | PRN
  Administered 2022-03-13: 40 mg via INTRA_ARTICULAR

## 2022-03-13 MED ORDER — BUPIVACAINE HCL 0.25 % IJ SOLN
4.0000 mL | INTRAMUSCULAR | Status: AC | PRN
  Administered 2022-03-13: 4 mL via INTRA_ARTICULAR

## 2022-03-13 NOTE — Progress Notes (Signed)
Office Visit Note   Patient: Christian Guerra           Date of Birth: 1960/07/30           MRN: [id number] Visit Date: 03/13/2022              Requested by: [name], MD [address] [place],  [place] [address] PCP: [name], MD   Assessment & Plan: Visit Diagnoses:  1. Impingement syndrome of right shoulder     Plan: Subacromial injection performed.  He was to follow-up in 7 months and he states he will work hard to lose weight to get below 350 so we can proceed with MRI scan to rule out right rotator cuff tear.  Follow-Up Instructions: Return in about 7 months (around 10/12/2022).   Orders:  No orders of the defined types were placed in this encounter.  No orders of the defined types were placed in this encounter.     Procedures: Large Joint Inj: R subacromial bursa on 03/13/2022 9:02 AM Indications: pain Details: 22 G 1.5 in needle  Arthrogram: No  Medications: 4 mL bupivacaine 0.25 %; 40 mg methylPREDNISolone acetate 40 MG/ML; 0.5 mL lidocaine 1 % Outcome: tolerated well, no immediate complications Procedure, treatment alternatives, risks and benefits explained, specific risks discussed. Consent was given by the patient. Immediately prior to procedure a time out was called to verify the correct patient, procedure, equipment, support staff and site/side marked as required. Patient was prepped and draped in the usual sterile fashion.       Clinical Data: No additional findings.   Subjective: Chief Complaint  Patient presents with   Right Shoulder - Follow-up, Pain    HPI 61 year old male returns with ongoing problems with right shoulder pain and discomfort.  Patient has morbid obesity states he is trying to work on weight loss his BMI currently is 60.  He is over the weight limit for MRI scan which is 350 pounds.  He is on chronic pain management with oxycodone IR 15 mg 4 times a day.  Previous treatment for hepatitis C doing well with normal  liver test recently.  Right shoulder is painful with activity.  Due to his size he has to use his arms to push himself up and transfer.  Review of Systems all systems updated unchanged.   Objective: Vital Signs: BP 120/75   Pulse 90   Ht 5' 6"  (1.676 m)   Wt (!) 375 lb (170.1 kg)   BMI 60.53 kg/m   Physical Exam Constitutional:      Appearance: He is well-developed.  HENT:     Head: Normocephalic and atraumatic.     Right Ear: External ear normal.     Left Ear: External ear normal.  Eyes:     Comments: Left eye corneal opacity.  Neck:     Thyroid: No thyromegaly.     Trachea: No tracheal deviation.  Cardiovascular:     Rate and Rhythm: Normal rate.  Pulmonary:     Effort: Pulmonary effort is normal.     Breath sounds: No wheezing.  Abdominal:     General: Bowel sounds are normal.     Palpations: Abdomen is soft.  Musculoskeletal:     Cervical back: Neck supple.  Skin:    General: Skin is warm and dry.     Capillary Refill: Capillary refill takes less than 2 seconds.  Neurological:     Mental Status: He is alert and oriented to  person, place, and time.  Psychiatric:        Behavior: Behavior normal.        Thought Content: Thought content normal.        Judgment: Judgment normal.     Ortho Exam positive impingement right shoulder.  Sensation in the hand is intact.  Specialty Comments:  No specialty comments available.  Imaging: No results found.   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   Herpes simplex type 1 antibody positive 05/15/2017   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   Current non-adherence to medical treatment 12/07/2016   Morbid obesity with BMI of 60.0-69.9, adult ([place]) 27/78/2423   Diastolic dysfunction with chronic heart failure ([place])    Fall    Major depression 08/04/2013   Generalized anxiety disorder 05/21/2013   Personal history of colonic adenoma 01/20/2013   OSA (obstructive sleep apnea) 09/27/2012   Chronic back pain 09/19/2012   Chronic hepatitis C without hepatic coma ([place]) 09/19/2012   GERD (gastroesophageal reflux disease) 09/19/2012   HTN (hypertension) 09/19/2012   Past Medical History:  Diagnosis Date   Anxiety    Bilateral swelling of feet and ankles    Bruised kidney 09/10/2016   Chronic lower back pain    Chronic pain    Daily headache    Diastolic dysfunction with chronic heart failure (HCC)    Fundic gland polyps of stomach, benign    GERD (gastroesophageal reflux disease)    Hepatitis C    "treated; got down to 0 load 6-8 years ago" (09/13/2016)   Hypertension    Memory difficulties    "in the last week or so" (09/13/2016)   Morbid obesity ([place])    Personal history of colonic adenoma 01/20/2013   Recurrent falls    "recently" (09/13/2016)   Sleep apnea    "wore mask at night before; couldn't tolerate it" (09/13/2016)   Wears glasses     Family History  Problem Relation Age of Onset   Hypertension Father        old age--6   Other Father        enlarged prostate   Colon cancer Neg Hx     Past Surgical History:  Procedure Laterality Date   ANKLE FRACTURE SURGERY Bilateral 2001   [place]   BIOPSY  11/11/2018   Procedure: BIOPSY;  Surgeon: Gatha Mayer, MD;  Location: [facility];  Service: Endoscopy;;   COLONOSCOPY     COLONOSCOPY WITH PROPOFOL N/A  11/11/2018   Procedure: COLONOSCOPY WITH PROPOFOL;  Surgeon: Gatha Mayer, MD;  Location: [facility];  Service: Endoscopy;  Laterality: N/A;   ESOPHAGOGASTRODUODENOSCOPY (EGD) WITH PROPOFOL N/A 11/11/2018   Procedure: ESOPHAGOGASTRODUODENOSCOPY (EGD) WITH PROPOFOL;  Surgeon: Gatha Mayer, MD;  Location: [facility];  Service: Endoscopy;  Laterality: N/A;   FOREARM SURGERY Left 2001   "  chain saw injury"   POLYPECTOMY  11/11/2018   Procedure: POLYPECTOMY;  Surgeon: Gatha Mayer, MD;  Location: [facility];  Service: Endoscopy;;   TESTICLE TORSION REDUCTION  1980   Social History   Occupational History   Occupation: unemployeed  Tobacco Use   Smoking status: Never   Smokeless tobacco: Current    Types: Snuff  Vaping Use   Vaping Use: Never used  Substance and Sexual Activity   Alcohol use: Not Currently    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    Drug use: Not Currently    Comment: 09/13/2016 "nothing for a long time; months"   Sexual activity: Not Currently

## 2022-04-30 ENCOUNTER — Inpatient Hospital Stay (HOSPITAL_COMMUNITY)
Admission: EM | Admit: 2022-04-30 | Discharge: 2022-05-03 | DRG: 683 | Disposition: A | Discharge status: Home Health | Payer: Medicaid Other | Attending: Internal Medicine | Admitting: Internal Medicine

## 2022-04-30 ENCOUNTER — Other Ambulatory Visit: Payer: Self-pay

## 2022-04-30 ENCOUNTER — Encounter (HOSPITAL_COMMUNITY): Payer: Self-pay | Admitting: Internal Medicine

## 2022-04-30 DIAGNOSIS — N179 Acute kidney failure, unspecified: Secondary | ICD-10-CM | POA: Diagnosis not present

## 2022-04-30 DIAGNOSIS — E872 Acidosis, unspecified: Secondary | ICD-10-CM | POA: Diagnosis present

## 2022-04-30 DIAGNOSIS — I10 Essential (primary) hypertension: Secondary | ICD-10-CM | POA: Diagnosis present

## 2022-04-30 DIAGNOSIS — Z720 Tobacco use: Secondary | ICD-10-CM

## 2022-04-30 DIAGNOSIS — Z885 Allergy status to narcotic agent status: Secondary | ICD-10-CM

## 2022-04-30 DIAGNOSIS — E871 Hypo-osmolality and hyponatremia: Secondary | ICD-10-CM | POA: Insufficient documentation

## 2022-04-30 DIAGNOSIS — I5032 Chronic diastolic (congestive) heart failure: Secondary | ICD-10-CM | POA: Diagnosis present

## 2022-04-30 DIAGNOSIS — I959 Hypotension, unspecified: Secondary | ICD-10-CM | POA: Diagnosis present

## 2022-04-30 DIAGNOSIS — B182 Chronic viral hepatitis C: Secondary | ICD-10-CM | POA: Diagnosis not present

## 2022-04-30 DIAGNOSIS — G8929 Other chronic pain: Secondary | ICD-10-CM | POA: Diagnosis present

## 2022-04-30 DIAGNOSIS — F410 Panic disorder [episodic paroxysmal anxiety] without agoraphobia: Secondary | ICD-10-CM | POA: Diagnosis present

## 2022-04-30 DIAGNOSIS — F411 Generalized anxiety disorder: Secondary | ICD-10-CM | POA: Diagnosis present

## 2022-04-30 DIAGNOSIS — K7581 Nonalcoholic steatohepatitis (NASH): Secondary | ICD-10-CM | POA: Diagnosis present

## 2022-04-30 DIAGNOSIS — F3130 Bipolar disorder, current episode depressed, mild or moderate severity, unspecified: Secondary | ICD-10-CM

## 2022-04-30 DIAGNOSIS — E860 Dehydration: Secondary | ICD-10-CM | POA: Diagnosis present

## 2022-04-30 DIAGNOSIS — E878 Other disorders of electrolyte and fluid balance, not elsewhere classified: Secondary | ICD-10-CM | POA: Diagnosis present

## 2022-04-30 DIAGNOSIS — I110 Hypertensive heart disease with heart failure: Secondary | ICD-10-CM | POA: Diagnosis present

## 2022-04-30 DIAGNOSIS — K219 Gastro-esophageal reflux disease without esophagitis: Secondary | ICD-10-CM | POA: Diagnosis present

## 2022-04-30 DIAGNOSIS — Z6841 Body Mass Index (BMI) 40.0 and over, adult: Secondary | ICD-10-CM

## 2022-04-30 DIAGNOSIS — Z8249 Family history of ischemic heart disease and other diseases of the circulatory system: Secondary | ICD-10-CM

## 2022-04-30 DIAGNOSIS — E876 Hypokalemia: Secondary | ICD-10-CM | POA: Diagnosis present

## 2022-04-30 DIAGNOSIS — G4733 Obstructive sleep apnea (adult) (pediatric): Secondary | ICD-10-CM | POA: Diagnosis present

## 2022-04-30 DIAGNOSIS — F329 Major depressive disorder, single episode, unspecified: Secondary | ICD-10-CM

## 2022-04-30 DIAGNOSIS — Z79899 Other long term (current) drug therapy: Secondary | ICD-10-CM

## 2022-04-30 DIAGNOSIS — F99 Mental disorder, not otherwise specified: Secondary | ICD-10-CM

## 2022-04-30 DIAGNOSIS — F5105 Insomnia due to other mental disorder: Secondary | ICD-10-CM

## 2022-04-30 LAB — LACTIC ACID, PLASMA
Lactic Acid, Venous: 3.9 mmol/L (ref 0.5–1.9)
Lactic Acid, Venous: 2.4 mmol/L (ref 0.5–1.9)

## 2022-04-30 LAB — CBC WITH DIFFERENTIAL/PLATELET
Abs Immature Granulocytes: 0.08 10*3/uL — ABNORMAL HIGH (ref 0.00–0.07)
Basophils Absolute: 0 10*3/uL (ref 0.0–0.1)
Basophils Relative: 0 %
Eosinophils Absolute: 0 10*3/uL (ref 0.0–0.5)
Eosinophils Relative: 0 %
HCT: 50.1 % (ref 39.0–52.0)
Hemoglobin: 16.3 g/dL (ref 13.0–17.0)
Immature Granulocytes: 1 %
Lymphocytes Relative: 12 %
Lymphs Abs: 2 10*3/uL (ref 0.7–4.0)
MCH: 26.5 pg (ref 26.0–34.0)
MCHC: 32.5 g/dL (ref 30.0–36.0)
MCV: 81.3 fL (ref 80.0–100.0)
Monocytes Absolute: 1.1 10*3/uL — ABNORMAL HIGH (ref 0.1–1.0)
Monocytes Relative: 7 %
Neutro Abs: 13.2 10*3/uL — ABNORMAL HIGH (ref 1.7–7.7)
Neutrophils Relative %: 80 %
Platelets: 394 10*3/uL (ref 150–400)
RBC: 6.16 MIL/uL — ABNORMAL HIGH (ref 4.22–5.81)
RDW: 17.5 % — ABNORMAL HIGH (ref 11.5–15.5)
WBC: 16.5 10*3/uL — ABNORMAL HIGH (ref 4.0–10.5)
nRBC: 0 % (ref 0.0–0.2)

## 2022-04-30 LAB — COMPREHENSIVE METABOLIC PANEL
ALT: 61 U/L — ABNORMAL HIGH (ref 0–44)
AST: 61 U/L — ABNORMAL HIGH (ref 15–41)
Albumin: 2.9 g/dL — ABNORMAL LOW (ref 3.5–5.0)
Alkaline Phosphatase: 212 U/L — ABNORMAL HIGH (ref 38–126)
Anion gap: 16 — ABNORMAL HIGH (ref 5–15)
BUN: 29 mg/dL — ABNORMAL HIGH (ref 8–23)
CO2: 25 mmol/L (ref 22–32)
Calcium: 8.5 mg/dL — ABNORMAL LOW (ref 8.9–10.3)
Chloride: 88 mmol/L — ABNORMAL LOW (ref 98–111)
Creatinine, Ser: 1.47 mg/dL — ABNORMAL HIGH (ref 0.61–1.24)
GFR, Estimated: 54 mL/min — ABNORMAL LOW (ref >60)
Glucose, Bld: 128 mg/dL — ABNORMAL HIGH (ref 70–99)
Potassium: 2.8 mmol/L — ABNORMAL LOW (ref 3.5–5.1)
Sodium: 129 mmol/L — ABNORMAL LOW (ref 135–145)
Total Bilirubin: 1.5 mg/dL — ABNORMAL HIGH (ref 0.3–1.2)
Total Protein: 7.7 g/dL (ref 6.5–8.1)

## 2022-04-30 LAB — BASIC METABOLIC PANEL
Anion gap: 11 (ref 5–15)
BUN: 33 mg/dL — ABNORMAL HIGH (ref 8–23)
CO2: 30 mmol/L (ref 22–32)
Calcium: 8.4 mg/dL — ABNORMAL LOW (ref 8.9–10.3)
Chloride: 89 mmol/L — ABNORMAL LOW (ref 98–111)
Creatinine, Ser: 1.43 mg/dL — ABNORMAL HIGH (ref 0.61–1.24)
GFR, Estimated: 56 mL/min — ABNORMAL LOW (ref >60)
Glucose, Bld: 118 mg/dL — ABNORMAL HIGH (ref 70–99)
Potassium: 4.2 mmol/L (ref 3.5–5.1)
Sodium: 130 mmol/L — ABNORMAL LOW (ref 135–145)

## 2022-04-30 LAB — CK: Total CK: 24 U/L — ABNORMAL LOW (ref 49–397)

## 2022-04-30 LAB — MAGNESIUM: Magnesium: 2.4 mg/dL (ref 1.7–2.4)

## 2022-04-30 MED ORDER — SERTRALINE HCL 50 MG PO TABS
150.0000 mg | ORAL_TABLET | Freq: Every day | ORAL | Status: DC
  Administered 2022-05-01 – 2022-05-03 (×3): 150 mg via ORAL
  Filled 2022-04-30 (×3): qty 1

## 2022-04-30 MED ORDER — ATENOLOL 50 MG PO TABS
50.0000 mg | ORAL_TABLET | Freq: Every day | ORAL | Status: DC
  Administered 2022-05-01 – 2022-05-03 (×3): 50 mg via ORAL
  Filled 2022-04-30 (×3): qty 1

## 2022-04-30 MED ORDER — TRAZODONE HCL 50 MG PO TABS
100.0000 mg | ORAL_TABLET | Freq: Every evening | ORAL | Status: DC | PRN
  Administered 2022-04-30 – 2022-05-01 (×2): 100 mg via ORAL
  Filled 2022-04-30 (×2): qty 2

## 2022-04-30 MED ORDER — MINOCYCLINE HCL 50 MG PO CAPS
100.0000 mg | ORAL_CAPSULE | Freq: Three times a day (TID) | ORAL | Status: DC
  Administered 2022-04-30 – 2022-05-03 (×8): 100 mg via ORAL
  Filled 2022-04-30: qty 1
  Filled 2022-04-30 (×10): qty 2

## 2022-04-30 MED ORDER — SODIUM CHLORIDE 0.9% FLUSH
3.0000 mL | Freq: Two times a day (BID) | INTRAVENOUS | Status: DC
  Administered 2022-05-01 – 2022-05-03 (×4): 3 mL via INTRAVENOUS

## 2022-04-30 MED ORDER — FINASTERIDE 5 MG PO TABS
5.0000 mg | ORAL_TABLET | Freq: Every day | ORAL | Status: DC
  Administered 2022-05-01 – 2022-05-03 (×3): 5 mg via ORAL
  Filled 2022-04-30 (×3): qty 1

## 2022-04-30 MED ORDER — POTASSIUM CHLORIDE 10 MEQ/100ML IV SOLN
10.0000 meq | INTRAVENOUS | Status: AC
  Administered 2022-04-30 (×4): 10 meq via INTRAVENOUS
  Filled 2022-04-30 (×4): qty 100

## 2022-04-30 MED ORDER — LACTATED RINGERS IV BOLUS
2000.0000 mL | Freq: Once | INTRAVENOUS | Status: AC
  Administered 2022-04-30: 2000 mL via INTRAVENOUS

## 2022-04-30 MED ORDER — POLYETHYLENE GLYCOL 3350 17 G PO PACK
17.0000 g | PACK | Freq: Every day | ORAL | Status: DC | PRN
  Administered 2022-05-01: 17 g via ORAL
  Filled 2022-04-30: qty 1

## 2022-04-30 MED ORDER — TAMSULOSIN HCL 0.4 MG PO CAPS
0.4000 mg | ORAL_CAPSULE | Freq: Every day | ORAL | Status: DC
  Administered 2022-05-01 – 2022-05-02 (×2): 0.4 mg via ORAL
  Filled 2022-04-30 (×2): qty 1

## 2022-04-30 MED ORDER — ENOXAPARIN SODIUM 100 MG/ML IJ SOSY
0.5000 mg/kg | PREFILLED_SYRINGE | INTRAMUSCULAR | Status: DC
  Administered 2022-04-30 – 2022-05-01 (×2): 85 mg via SUBCUTANEOUS
  Filled 2022-04-30: qty 0.85
  Filled 2022-04-30: qty 1

## 2022-04-30 MED ORDER — ALBUTEROL SULFATE (2.5 MG/3ML) 0.083% IN NEBU: 3.0000 mL | INHALATION_SOLUTION | Freq: Four times a day (QID) | RESPIRATORY_TRACT | Status: DC | PRN

## 2022-04-30 MED ORDER — ACETAMINOPHEN 650 MG RE SUPP: 650.0000 mg | Freq: Four times a day (QID) | RECTAL | Status: DC | PRN

## 2022-04-30 MED ORDER — PANTOPRAZOLE SODIUM 40 MG PO TBEC: 40.0000 mg | DELAYED_RELEASE_TABLET | Freq: Every day | ORAL | Status: DC

## 2022-04-30 MED ORDER — POTASSIUM CHLORIDE 20 MEQ PO PACK
40.0000 meq | PACK | Freq: Once | ORAL | Status: AC
  Administered 2022-04-30: 40 meq via ORAL
  Filled 2022-04-30: qty 2

## 2022-04-30 MED ORDER — ALUM & MAG HYDROXIDE-SIMETH 200-200-20 MG/5ML PO SUSP
30.0000 mL | ORAL | Status: DC | PRN
  Administered 2022-04-30: 30 mL via ORAL
  Filled 2022-04-30: qty 30

## 2022-04-30 MED ORDER — ACETAMINOPHEN 325 MG PO TABS
650.0000 mg | ORAL_TABLET | Freq: Four times a day (QID) | ORAL | Status: DC | PRN
  Administered 2022-05-01: 650 mg via ORAL
  Filled 2022-04-30: qty 2

## 2022-04-30 NOTE — ED Triage Notes (Signed)
Patient referred to ED for evaluation after a lab resulted with WBC of 22k. Patient reports having eleventh teeth extracted on 04/11/22 and started feeling weak a few days after. Patient is alert, oriented, and in no apparent distress at this time.

## 2022-04-30 NOTE — ED Notes (Signed)
ED TO INPATIENT HANDOFF REPORT  ED Nurse Name and Phone #: Newman Pies [phone]  S Name/Age/Gender Christian Guerra 62 y.o. male Room/Bed: 021C/021C  Code Status   Code Status: Full Code  Home/SNF/Other Home Patient oriented to: self, place, time, and situation Is this baseline? Yes   Triage Complete: Triage complete  Chief Complaint AKI (acute kidney injury) [facility]) [N17.9]  Triage Note Patient referred to ED for evaluation after a lab resulted with WBC of 22k. Patient reports having eleventh teeth extracted on 04/11/22 and started feeling weak a few days after. Patient is alert, oriented, and in no apparent distress at this time.   Allergies Allergies  Allergen Reactions   Codeine Itching    Level of Care/Admitting Diagnosis ED Disposition     ED Disposition  Admit   Condition  --   Comment  Hospital Area: [facility] [[id number]]  Level of Care: Telemetry Medical [104]  May place patient in observation at [facility] or [facility] if equivalent level of care is available:: No  Covid Evaluation: Asymptomatic - no recent exposure (last 10 days) testing not required  Diagnosis: AKI (acute kidney injury) [facility]) [[id number]]  Admitting Physician: Marcelyn Bruins [[id number]]  Attending Physician: Marcelyn Bruins [[id number]]          B Medical/Surgery History Past Medical History:  Diagnosis Date   Anxiety    Bilateral swelling of feet and ankles    Bruised kidney 09/10/2016   Chronic lower back pain    Chronic pain    Daily headache    Diastolic dysfunction with chronic heart failure (HCC)    Fundic gland polyps of stomach, benign    GERD (gastroesophageal reflux disease)    Hepatitis C    "treated; got down to 0 load 6-8 years ago" (09/13/2016)   Hypertension    Memory difficulties    "in the last week or so" (09/13/2016)   Morbid obesity ([place])    Personal history of colonic adenoma 01/20/2013   Recurrent falls    "recently"  (09/13/2016)   Sleep apnea    "wore mask at night before; couldn't tolerate it" (09/13/2016)   Wears glasses    Past Surgical History:  Procedure Laterality Date   ANKLE FRACTURE SURGERY Bilateral 2001   [place]   BIOPSY  11/11/2018   Procedure: BIOPSY;  Surgeon: Gatha Mayer, MD;  [facility];  Service: Endoscopy;;   COLONOSCOPY     COLONOSCOPY WITH PROPOFOL N/A 11/11/2018   Procedure: COLONOSCOPY WITH PROPOFOL;  Surgeon: Gatha Mayer, MD;  [facility];  Service: Endoscopy;  Laterality: N/A;   ESOPHAGOGASTRODUODENOSCOPY (EGD) WITH PROPOFOL N/A 11/11/2018   Procedure: ESOPHAGOGASTRODUODENOSCOPY (EGD) WITH PROPOFOL;  Surgeon: Gatha Mayer, MD;  [facility];  Service: Endoscopy;  Laterality: N/A;   FOREARM SURGERY Left 2001   "chain saw injury"   POLYPECTOMY  11/11/2018   Procedure: POLYPECTOMY;  Surgeon: Gatha Mayer, MD;  [facility];  Service: Endoscopy;;   [place]     A IV Location/Drains/Wounds Patient Lines/Drains/Airways Status     Active Line/Drains/Airways     Name Placement date Placement time Site Days   Peripheral IV 04/30/22 20 G 1.16" Left Antecubital 04/30/22  1502  Antecubital  less than 1            Intake/Output Last 24 hours  Intake/Output Summary (Last 24 hours) at 04/30/2022 2131 Last data filed at 04/30/2022 1901  Gross per 24 hour  Intake 2400 ml  Output --  Net 2400 ml    Labs/Imaging Results for orders placed or performed during the hospital encounter of 04/30/22 (from the past 48 hour(s))  Lactic acid, plasma     Status: Abnormal   Collection Time: 04/30/22 11:28 AM  Result Value Ref Range   Lactic Acid, Venous 3.9 (HH) 0.5 - 1.9 mmol/L    Comment: CRITICAL RESULT CALLED TO, READ BACK BY AND VERIFIED WITH C.ROWE,RN 1221 04/30/22 CLARK,S Performed at [facility], [place] [address] [place], [place] [address]   Comprehensive metabolic panel     Status:  Abnormal   Collection Time: 04/30/22 11:28 AM  Result Value Ref Range   Sodium 129 (L) 135 - 145 mmol/L   Potassium 2.8 (L) 3.5 - 5.1 mmol/L   Chloride 88 (L) 98 - 111 mmol/L   CO2 25 22 - 32 mmol/L   Glucose, Bld 128 (H) 70 - 99 mg/dL    Comment: Glucose reference range applies only to samples taken after fasting for at least 8 hours.   BUN 29 (H) 8 - 23 mg/dL   Creatinine, Ser 1.47 (H) 0.61 - 1.24 mg/dL   Calcium 8.5 (L) 8.9 - 10.3 mg/dL   Total Protein 7.7 6.5 - 8.1 g/dL   Albumin 2.9 (L) 3.5 - 5.0 g/dL   AST 61 (H) 15 - 41 U/L   ALT 61 (H) 0 - 44 U/L   Alkaline Phosphatase 212 (H) 38 - 126 U/L   Total Bilirubin 1.5 (H) 0.3 - 1.2 mg/dL   GFR, Estimated 54 (L) >60 mL/min    Comment: (NOTE) Calculated using the CKD-EPI Creatinine Equation (2021)    Anion gap 16 (H) 5 - 15    Comment: Performed at [facility], [place] [address] [place], [place] [address]  CBC with Differential     Status: Abnormal   Collection Time: 04/30/22 11:28 AM  Result Value Ref Range   WBC 16.5 (H) 4.0 - 10.5 K/uL   RBC 6.16 (H) 4.22 - 5.81 MIL/uL   Hemoglobin 16.3 13.0 - 17.0 g/dL   HCT 50.1 39.0 - 52.0 %   MCV 81.3 80.0 - 100.0 fL   MCH 26.5 26.0 - 34.0 pg   MCHC 32.5 30.0 - 36.0 g/dL   RDW 17.5 (H) 11.5 - 15.5 %   Platelets 394 150 - 400 K/uL   nRBC 0.0 0.0 - 0.2 %   Neutrophils Relative % 80 %   Neutro Abs 13.2 (H) 1.7 - 7.7 K/uL   Lymphocytes Relative 12 %   Lymphs Abs 2.0 0.7 - 4.0 K/uL   Monocytes Relative 7 %   Monocytes Absolute 1.1 (H) 0.1 - 1.0 K/uL   Eosinophils Relative 0 %   Eosinophils Absolute 0.0 0.0 - 0.5 K/uL   Basophils Relative 0 %   Basophils Absolute 0.0 0.0 - 0.1 K/uL   Immature Granulocytes 1 %   Abs Immature Granulocytes 0.08 (H) 0.00 - 0.07 K/uL    Comment: Performed at [facility] [address], [place], [place] [address]  CK     Status: Abnormal   Collection Time: 04/30/22 11:28 AM  Result Value Ref Range   Total CK 24 (L) 49 - 397 U/L    Comment:  Performed at [facility], [place] [address] [place], [place] [address]  Magnesium     Status: None   Collection Time: 04/30/22 11:28 AM  Result Value Ref Range   Magnesium 2.4 1.7 - 2.4 mg/dL    Comment: Performed at [facility], [place] [address] [place], [place] [address]  Lactic acid, plasma     Status: Abnormal   Collection Time: 04/30/22  3:12 PM  Result Value Ref Range   Lactic Acid, Venous 2.4 (HH) 0.5 - 1.9 mmol/L    Comment: CRITICAL VALUE NOTED. VALUE IS CONSISTENT WITH PREVIOUSLY REPORTED/CALLED VALUE Performed at [facility], [place] [address], [place], [place] [address]    No results found.  Pending Labs Unresulted Labs (From admission, onward)     Start     Ordered   05/07/22 0500  Creatinine, serum  (enoxaparin (LOVENOX)    CrCl >/= 30 ml/min)  Weekly,   R     Comments: while on enoxaparin therapy    04/30/22 1850   05/01/22 0500  CBC  Tomorrow morning,   R        04/30/22 1850   05/01/22 0500  Comprehensive metabolic panel  Tomorrow morning,   R        04/30/22 1857   04/30/22 7616  Basic metabolic panel  Once,   R        04/30/22 1850   04/30/22 1841  HIV Antibody (routine testing w rflx)  (HIV Antibody (Routine testing w reflex) panel)  Once,   R        04/30/22 1850   04/30/22 1431  Urinalysis, Routine w reflex microscopic  Once,   URGENT        04/30/22 1431            Vitals/Pain Today's Vitals   04/30/22 1800 04/30/22 1802 04/30/22 2100 04/30/22 2117  BP: 139/82 139/82 133/70   Pulse: 76 79 85   Resp: '15 17 13   '$ Temp:  97.9 F (36.6 C)  97.8 F (36.6 C)  TempSrc:  Oral  Oral  SpO2: 99% 100% 98%   Weight:   (!) 375 lb (170.1 kg)   PainSc:  0-No pain      Isolation Precautions No active isolations  Medications Medications  albuterol (PROVENTIL) (2.5 MG/3ML) 0.083% nebulizer solution 3 mL (has no administration in time range)  enoxaparin (LOVENOX) injection 85 mg (has no administration in time range)  sodium chloride  flush (NS) 0.9 % injection 3 mL (3 mLs Intravenous Not Given 04/30/22 2101)  acetaminophen (TYLENOL) tablet 650 mg (has no administration in time range)    Or  acetaminophen (TYLENOL) suppository 650 mg (has no administration in time range)  polyethylene glycol (MIRALAX / GLYCOLAX) packet 17 g (has no administration in time range)  finasteride (PROSCAR) tablet 5 mg (has no administration in time range)  atenolol (TENORMIN) tablet 50 mg (has no administration in time range)  minocycline (MINOCIN) capsule 100 mg (has no administration in time range)  pantoprazole (PROTONIX) EC tablet 40 mg (has no administration in time range)  sertraline (ZOLOFT) tablet 150 mg (has no administration in time range)  traZODone (DESYREL) tablet 100 mg (has no administration in time range)  tamsulosin (FLOMAX) capsule 0.4 mg (has no administration in time range)  lactated ringers bolus 2,000 mL (0 mLs Intravenous Stopped 04/30/22 1901)  potassium chloride 10 mEq in 100 mL IVPB (0 mEq Intravenous Stopped 04/30/22 1901)  potassium chloride (KLOR-CON) packet 40 mEq (40 mEq Oral Given 04/30/22 2019)    Mobility walks with device Moderate fall risk   Focused Assessments Cardiac Assessment Handoff:  Lab Results  Component Value Date   CKTOTAL 24 (L) 04/30/2022   No results found for: "DDIMER" Does the Patient currently have chest pain? No    R Recommendations: See Admitting Provider Note  Report given to:   Additional Notes:

## 2022-04-30 NOTE — ED Provider Notes (Signed)
[facility] EMERGENCY DEPARTMENT Provider Note   CSN: [id number] Arrival date & time: 04/30/22  1001     History  Chief Complaint  Patient presents with   Abnormal Lab    Christian Guerra is a 62 y.o. male.  62 year old male presents today for evaluation of fatigue and generalized weakness which has been ongoing for about a week.  11 teeth were extracted on 12/20.  He states following this he started a 2-week water only fast for religious reasons.  He states he was able to make it 15 days.  He denies any chest pain, shortness of breath, headache, abdominal pain, generalized muscle aches.  He is making normal urine.  Last urine output was this morning.  States for the better part of the 2 weeks while he was doing a water fast he was drinking about 1 gallon of fluids.  Reports decreased hydration over the past day or so.  The history is provided by the patient. No language interpreter was used.       Home Medications Prior to Admission medications   Medication Sig Start Date End Date Taking? Authorizing Provider  albuterol (VENTOLIN HFA) 108 (90 Base) MCG/ACT inhaler Inhale 1 puff into the lungs every 6 (six) hours as needed for wheezing or shortness of breath. 08/19/18   Ladell Pier, MD  atenolol (TENORMIN) 50 MG tablet Take 1 tablet (50 mg total) by mouth daily. 03/25/17   Alfonse Spruce, FNP  cyclobenzaprine (FLEXERIL) 10 MG tablet Take 10 mg by mouth 3 (three) times daily as needed for muscle spasms.    [provider]  finasteride (PROSCAR) 5 MG tablet Take 5 mg by mouth daily. 11/17/21   [provider]  furosemide (LASIX) 20 MG tablet Take 20 mg by mouth daily. 10/03/21   [provider]  gabapentin (NEURONTIN) 600 MG tablet Take 600 mg by mouth 3 (three) times daily. 09/05/18   [provider]  hydrochlorothiazide (HYDRODIURIL) 25 MG tablet Take 25 mg by mouth daily. 10/28/18   [provider]  lurasidone  120 MG TABS Take 1 tablet (120 mg total) by mouth daily with breakfast. 11/29/21   Nwoko, Terese Door, PA  minocycline (MINOCIN) 100 MG capsule Take 100 mg by mouth 3 (three) times daily. 10/04/18   [provider]  omeprazole (PRILOSEC) 20 MG capsule Take 1 capsule (20 mg total) by mouth daily. Patient taking differently: Take 20 mg by mouth daily at 2 PM. 11/05/13   Theodis Blaze, MD  oxyCODONE (ROXICODONE) 15 MG immediate release tablet Take 15 mg by mouth every 8 (eight) hours.    [provider]  sertraline (ZOLOFT) 50 MG tablet Take 3 tablets (150 mg total) by mouth daily. 11/29/21   Nwoko, Terese Door, PA  tamsulosin (FLOMAX) 0.4 MG CAPS capsule Take 1 capsule (0.4 mg total) by mouth daily at 2 PM. 12/11/18   Ladell Pier, MD  traZODone (DESYREL) 100 MG tablet Take 1 tablet (100 mg total) by mouth at bedtime as needed for sleep. 11/29/21   Nwoko, Terese Door, PA      Allergies    Codeine    Review of Systems   Review of Systems  Constitutional:  Positive for fatigue. Negative for activity change and fever.  Respiratory:  Negative for shortness of breath.   Cardiovascular:  Negative for chest pain and leg swelling.  Gastrointestinal:  Negative for abdominal pain, nausea and vomiting.  Neurological:  Positive for weakness.  Negative for light-headedness.  All other systems reviewed and are negative.   Physical Exam Updated Vital Signs BP (!) 125/91   Pulse 78   Temp 97.7 F (36.5 C) (Oral)   Resp 17   SpO2 98%  Physical Exam Vitals and nursing note reviewed.  Constitutional:      General: He is not in acute distress.    Appearance: Normal appearance. He is obese. He is not ill-appearing.  HENT:     Head: Normocephalic and atraumatic.     Nose: Nose normal.  Eyes:     General: No scleral icterus.    Extraocular Movements: Extraocular movements intact.     Conjunctiva/sclera: Conjunctivae normal.  Cardiovascular:     Rate and Rhythm: Normal rate and regular  rhythm.     Pulses: Normal pulses.  Pulmonary:     Effort: Pulmonary effort is normal. No respiratory distress.     Breath sounds: Normal breath sounds. No wheezing or rales.  Abdominal:     General: There is no distension.     Palpations: Abdomen is soft.     Tenderness: There is no abdominal tenderness. There is no guarding.  Musculoskeletal:        General: Normal range of motion.     Cervical back: Normal range of motion.     Right lower leg: No edema.     Left lower leg: No edema.  Skin:    General: Skin is warm and dry.  Neurological:     General: No focal deficit present.     Mental Status: He is alert. Mental status is at baseline.     ED Results / Procedures / Treatments   Labs (all labs ordered are listed, but only abnormal results are displayed) Labs Reviewed  LACTIC ACID, PLASMA - Abnormal; Notable for the following components:      Result Value   Lactic Acid, Venous 3.9 (*)    All other components within normal limits  LACTIC ACID, PLASMA - Abnormal; Notable for the following components:   Lactic Acid, Venous 2.4 (*)    All other components within normal limits  COMPREHENSIVE METABOLIC PANEL - Abnormal; Notable for the following components:   Sodium 129 (*)    Potassium 2.8 (*)    Chloride 88 (*)    Glucose, Bld 128 (*)    BUN 29 (*)    Creatinine, Ser 1.47 (*)    Calcium 8.5 (*)    Albumin 2.9 (*)    AST 61 (*)    ALT 61 (*)    Alkaline Phosphatase 212 (*)    Total Bilirubin 1.5 (*)    GFR, Estimated 54 (*)    Anion gap 16 (*)    All other components within normal limits  CBC WITH DIFFERENTIAL/PLATELET - Abnormal; Notable for the following components:   WBC 16.5 (*)    RBC 6.16 (*)    RDW 17.5 (*)    Neutro Abs 13.2 (*)    Monocytes Absolute 1.1 (*)    Abs Immature Granulocytes 0.08 (*)    All other components within normal limits  CK - Abnormal; Notable for the following components:   Total CK 24 (*)    All other components within normal  limits  MAGNESIUM  URINALYSIS, ROUTINE W REFLEX MICROSCOPIC    EKG None  Radiology No results found.  Procedures .Critical Care  Performed by: Evlyn Courier, PA-C Authorized by: Evlyn Courier, PA-C   Critical care provider statement:  Critical care time (minutes):  30   Critical care was necessary to treat or prevent imminent or life-threatening deterioration of the following conditions:  Renal failure and dehydration   Critical care was time spent personally by me on the following activities:  Development of treatment plan with patient or surrogate, discussions with consultants, evaluation of patient's response to treatment, examination of patient, ordering and review of laboratory studies, ordering and review of radiographic studies, ordering and performing treatments and interventions, pulse oximetry, re-evaluation of patient's condition and review of old charts     Medications Ordered in ED Medications  potassium chloride 10 mEq in 100 mL IVPB (0 mEq Intravenous Stopped 04/30/22 1746)  lactated ringers bolus 2,000 mL (2,000 mLs Intravenous New Bag/Given 04/30/22 1501)    ED Course/ Medical Decision Making/ A&P                           Medical Decision Making Amount and/or Complexity of Data Reviewed Labs: ordered.  Risk Prescription drug management.   Medical Decision Making / ED Course   This patient presents to the ED for concern of this, this involves an extensive number of treatment options, and is a complaint that carries with it a high risk of complications and morbidity.  The differential diagnosis includes postsurgical infection, dehydration, viral URI  MDM: 62 year old male presents today for evaluation of weakness/fatigue which has been ongoing for the past week.  He had 11 teeth pulled on 12/20.  No sign of infection on exam.  No tenderness to the mandible.  No difficulty with swallowing per patient.  He subsequently went on a 2-week water fast.  He was seen by  his doctor last week and had some blood work performed.  He had an elevated white blood cell count of 22,000 and he was referred to the emergency department.  This is improved and currently at 16,000 with a left shift of 13 however only 80% neutrophils.Marland Kitchen  He is afebrile and without tachycardia.  Low suspicion for infection.  Without anemia.  CMP does show mild hyponatremia of 129, hypokalemia at 2.8, glucose of only 128.  BUN is elevated at 29, and if AKI is present at 1.47 with baseline of 0.78.  LFTs are mildly elevated as well.  Without abdominal tenderness or pain.  Anion gap is 16.  Initial lactic acid 3.9 and repeat of 2.4.  She is actively receiving 2 L fluid bolus.  This has not completed prior to repeat lactic acid.  Magnesium was 2.4.  CK of 24.  EKG without acute hypokalemic changes.  Will discuss with admitting team for admission.  Discussed with hospitalist will evaluate patient for admission.   Lab Tests: -I ordered, reviewed, and interpreted labs.   The pertinent results include:   Labs Reviewed  LACTIC ACID, PLASMA - Abnormal; Notable for the following components:      Result Value   Lactic Acid, Venous 3.9 (*)    All other components within normal limits  LACTIC ACID, PLASMA - Abnormal; Notable for the following components:   Lactic Acid, Venous 2.4 (*)    All other components within normal limits  COMPREHENSIVE METABOLIC PANEL - Abnormal; Notable for the following components:   Sodium 129 (*)    Potassium 2.8 (*)    Chloride 88 (*)    Glucose, Bld 128 (*)    BUN 29 (*)    Creatinine, Ser 1.47 (*)    Calcium 8.5 (*)  Albumin 2.9 (*)    AST 61 (*)    ALT 61 (*)    Alkaline Phosphatase 212 (*)    Total Bilirubin 1.5 (*)    GFR, Estimated 54 (*)    Anion gap 16 (*)    All other components within normal limits  CBC WITH DIFFERENTIAL/PLATELET - Abnormal; Notable for the following components:   WBC 16.5 (*)    RBC 6.16 (*)    RDW 17.5 (*)    Neutro Abs 13.2 (*)     Monocytes Absolute 1.1 (*)    Abs Immature Granulocytes 0.08 (*)    All other components within normal limits  CK - Abnormal; Notable for the following components:   Total CK 24 (*)    All other components within normal limits  MAGNESIUM  URINALYSIS, ROUTINE W REFLEX MICROSCOPIC      EKG  EKG Interpretation  Date/Time:    Ventricular Rate:    PR Interval:    QRS Duration:   QT Interval:    QTC Calculation:   R Axis:     Text Interpretation:          Medicines ordered and prescription drug management: Meds ordered this encounter  Medications   lactated ringers bolus 2,000 mL   potassium chloride 10 mEq in 100 mL IVPB    -I have reviewed the patients home medicines and have made adjustments as needed  Critical interventions IV fluids, IV potassium    Reevaluation: After the interventions noted above, I reevaluated the patient and found that they have :stayed the same  Co morbidities that complicate the patient evaluation  Past Medical History:  Diagnosis Date   Anxiety    Bilateral swelling of feet and ankles    Bruised kidney 09/10/2016   Chronic lower back pain    Chronic pain    Daily headache    Diastolic dysfunction with chronic heart failure (HCC)    Fundic gland polyps of stomach, benign    GERD (gastroesophageal reflux disease)    Hepatitis C    "treated; got down to 0 load 6-8 years ago" (09/13/2016)   Hypertension    Memory difficulties    "in the last week or so" (09/13/2016)   Morbid obesity ([place])    Personal history of colonic adenoma 01/20/2013   Recurrent falls    "recently" (09/13/2016)   Sleep apnea    "wore mask at night before; couldn't tolerate it" (09/13/2016)   Wears glasses       Dispostion: Patient discussed with hospitalist who will evaluate patient for admission.   Final Clinical Impression(s) / ED Diagnoses Final diagnoses:  Lactic acidosis  Hypokalemia  Acute kidney injury [facility])    Rx / DC Orders ED Discharge  Orders     None         Evlyn Courier, PA-C 04/30/22 1847    Tegeler, Gwenyth Allegra, MD 05/01/22 [phone]

## 2022-04-30 NOTE — ED Provider Triage Note (Signed)
Emergency Medicine Provider Triage Evaluation Note  Mukund Weinreb , a 62 y.o. male  was evaluated in triage.  Pt complains of weakness that has been ongoing for about a week.  States he recently had 11 teeth pulled out.  Had blood work done sometime last week.  Had elevated white blood cell count.  They have been attempting to get a hold of the patient without success throughout the weekend.  Finally got a hold of him today and recommended he come into the emergency department.  He states he recently completed a 2-week water only religious fast.  He states he broke his fast 2 days ago.  Dates he waited 15 days.  Initially noted to be hypotensive.  Recheck during my interview and improved to 111/81.  Afebrile.  Review of Systems  Positive: As above Negative: As above  Physical Exam  BP (!) 89/74 (BP Location: Right Arm)   Pulse 98   Temp 97.6 F (36.4 C)   Resp 16   SpO2 100%  Gen:   Awake, no distress   Resp:  Normal effort  MSK:   Moves extremities without difficulty  Other:    Medical Decision Making  Medically screening exam initiated at 11:23 AM.  Appropriate orders placed.  Morio Widen Weng was informed that the remainder of the evaluation will be completed by another provider, this initial triage assessment does not replace that evaluation, and the importance of remaining in the ED until their evaluation is complete.     Evlyn Courier, PA-C 04/30/22 1125

## 2022-04-30 NOTE — H&P (Signed)
History and Physical   [name] [facility] UVO:[id number] DOB: [birth year] DOA: 04/30/2022  PCP: [name], MD   Patient coming from: Home  Chief Complaint: Abnormal labs, weakness  HPI: Christian Guerra is a 62 y.o. male with medical history significant of bipolar disorder, panic disorder, anxiety, depression, insomnia, GERD, chronic hepatitis C, nonalcoholic fatty liver disease, chronic pain, OSA, obesity, hypertension, CHF presenting with abnormal labs and weakness.  Patient was instructed to seek further evaluation due to elevated white count of 20.  Patient reports that on 12/20 he had 11 teeth extracted and has been feeling weak starting a few days after that.  This was in the setting of a water only fast that he began for "religious reasons "following the teeth extraction.  He states he has been able to fast for 15 days.  Chart review says he has a history of this.  He reports drinking about a gallon of water a day but less the last couple days.  He denies any jaw pain, fevers, chills, chest pain, shortness of breath, abdominal pain, constipation, diarrhea, nausea, vomiting.  ED Course: Vital signs in the ED stable.  Lab workup included CMP with sodium 129, potassium 2.8, chloride a 88, BUN 29, creatinine elevated to 1.47 from baseline of 1, glucose 128, calcium 8.5, albumin 2.9, AST 61, ALT 61, alk phos 212, T. bili 1.4.  CBC with leukocytosis 16.5.  Lactic acid elevated initially 3.8 but improved to 2.4 and repeat.  CK level normal.  Urinalysis pending.  Magnesium level normal.  Patient received 2 L and 40 mill equivalents of IV potassium in the ED.  Review of Systems: As per HPI otherwise all other systems reviewed and are negative.  Past Medical History:  Diagnosis Date   Anxiety    Bilateral swelling of feet and ankles    Bruised kidney 09/10/2016   Chronic lower back pain    Chronic pain    Daily headache    Diastolic dysfunction with chronic heart failure (HCC)     Fundic gland polyps of stomach, benign    GERD (gastroesophageal reflux disease)    Hepatitis C    "treated; got down to 0 load 6-8 years ago" (09/13/2016)   Hypertension    Memory difficulties    "in the last week or so" (09/13/2016)   Morbid obesity ([place])    Personal history of colonic adenoma 01/20/2013   Recurrent falls    "recently" (09/13/2016)   Sleep apnea    "wore mask at night before; couldn't tolerate it" (09/13/2016)   Wears glasses     Past Surgical History:  Procedure Laterality Date   ANKLE FRACTURE SURGERY Bilateral 2001   [place]   BIOPSY  11/11/2018   Procedure: BIOPSY;  Surgeon: Gatha Mayer, MD;  Location: [facility];  Service: Endoscopy;;   COLONOSCOPY     COLONOSCOPY WITH PROPOFOL N/A 11/11/2018   Procedure: COLONOSCOPY WITH PROPOFOL;  Surgeon: Gatha Mayer, MD;  Location: [facility];  Service: Endoscopy;  Laterality: N/A;   ESOPHAGOGASTRODUODENOSCOPY (EGD) WITH PROPOFOL N/A 11/11/2018   Procedure: ESOPHAGOGASTRODUODENOSCOPY (EGD) WITH PROPOFOL;  Surgeon: Gatha Mayer, MD;  Location: [facility];  Service: Endoscopy;  Laterality: N/A;   FOREARM SURGERY Left 2001   "chain saw injury"   POLYPECTOMY  11/11/2018   Procedure: POLYPECTOMY;  Surgeon: Gatha Mayer, MD;  Location: [facility];  Service: Endoscopy;;   [place] History  reports that  he has never smoked. His smokeless tobacco use includes snuff. He reports that he does not currently use alcohol. He reports that he does not currently use drugs.  Allergies  Allergen Reactions   Codeine Itching    Family History  Problem Relation Age of Onset   Hypertension Father        old age--55   Other Father        enlarged prostate   Colon cancer Neg Hx   Reviewed on admission  Prior to Admission medications   Medication Sig Start Date End Date Taking? Authorizing Provider  albuterol (VENTOLIN HFA) 108 (90 Base) MCG/ACT inhaler Inhale 1 puff into  the lungs every 6 (six) hours as needed for wheezing or shortness of breath. 08/19/18   Ladell Pier, MD  atenolol (TENORMIN) 50 MG tablet Take 1 tablet (50 mg total) by mouth daily. 03/25/17   Alfonse Spruce, FNP  cyclobenzaprine (FLEXERIL) 10 MG tablet Take 10 mg by mouth 3 (three) times daily as needed for muscle spasms.    [provider]  finasteride (PROSCAR) 5 MG tablet Take 5 mg by mouth daily. 11/17/21   [provider]  furosemide (LASIX) 20 MG tablet Take 20 mg by mouth daily. 10/03/21   [provider]  gabapentin (NEURONTIN) 600 MG tablet Take 600 mg by mouth 3 (three) times daily. 09/05/18   [provider]  hydrochlorothiazide (HYDRODIURIL) 25 MG tablet Take 25 mg by mouth daily. 10/28/18   [provider]  lurasidone 120 MG TABS Take 1 tablet (120 mg total) by mouth daily with breakfast. 11/29/21   Nwoko, Terese Door, PA  minocycline (MINOCIN) 100 MG capsule Take 100 mg by mouth 3 (three) times daily. 10/04/18   [provider]  omeprazole (PRILOSEC) 20 MG capsule Take 1 capsule (20 mg total) by mouth daily. Patient taking differently: Take 20 mg by mouth daily at 2 PM. 11/05/13   Theodis Blaze, MD  oxyCODONE (ROXICODONE) 15 MG immediate release tablet Take 15 mg by mouth every 8 (eight) hours.    [provider]  sertraline (ZOLOFT) 50 MG tablet Take 3 tablets (150 mg total) by mouth daily. 11/29/21   Nwoko, Terese Door, PA  tamsulosin (FLOMAX) 0.4 MG CAPS capsule Take 1 capsule (0.4 mg total) by mouth daily at 2 PM. 12/11/18   Ladell Pier, MD  traZODone (DESYREL) 100 MG tablet Take 1 tablet (100 mg total) by mouth at bedtime as needed for sleep. 11/29/21   [name], PA    Physical Exam: Vitals:   04/30/22 1600 04/30/22 1630 04/30/22 1700 04/30/22 1802  BP: 139/80 138/83 (!) 125/91 139/82  Pulse:  76 78 79  Resp: '18 15 17 17  '$ Temp:    97.9 F (36.6 C)  TempSrc:    Oral  SpO2:  100% 98% 100%     Physical Exam Constitutional:      General: He is not in acute distress.    Appearance: Normal appearance. He is obese.  HENT:     Head: Normocephalic and atraumatic.     Mouth/Throat:     Mouth: Mucous membranes are moist.     Pharynx: Oropharynx is clear.  Eyes:     Extraocular Movements: Extraocular movements intact.     Pupils: Pupils are equal, round, and reactive to light.  Cardiovascular:     Rate and Rhythm: Normal rate and regular rhythm.     Pulses: Normal pulses.  Heart sounds: Normal heart sounds.  Pulmonary:     Effort: Pulmonary effort is normal. No respiratory distress.     Breath sounds: Normal breath sounds.  Abdominal:     General: Bowel sounds are normal. There is no distension.     Palpations: Abdomen is soft.     Tenderness: There is no abdominal tenderness.  Musculoskeletal:        General: No swelling or deformity.  Skin:    General: Skin is warm and dry.     Comments: Chronic skin changes  Neurological:     General: No focal deficit present.     Mental Status: Mental status is at baseline.    Labs on Admission: I have personally reviewed following labs and imaging studies  CBC: Recent Labs  Lab 04/30/22 1128  WBC 16.5*  NEUTROABS 13.2*  HGB 16.3  HCT 50.1  MCV 81.3  PLT 916    Basic Metabolic Panel: Recent Labs  Lab 04/30/22 1128  NA 129*  K 2.8*  CL 88*  CO2 25  GLUCOSE 128*  BUN 29*  CREATININE 1.47*  CALCIUM 8.5*  MG 2.4    GFR: CrCl cannot be calculated (Unknown ideal weight.).  Liver Function Tests: Recent Labs  Lab 04/30/22 1128  AST 61*  ALT 61*  ALKPHOS 212*  BILITOT 1.5*  PROT 7.7  ALBUMIN 2.9*    Urine analysis:    Component Value Date/Time   COLORURINE YELLOW 09/11/2016 1405   APPEARANCEUR Clear 10/14/2018 1132   LABSPEC 1.012 09/11/2016 1405   PHURINE 5.0 09/11/2016 1405   GLUCOSEU Negative 10/14/2018 1132   HGBUR MODERATE (A) 09/11/2016 1405   BILIRUBINUR Negative 10/14/2018 1132    KETONESUR NEGATIVE 09/11/2016 1405   PROTEINUR 1+ (A) 10/14/2018 1132   PROTEINUR NEGATIVE 09/11/2016 1405   UROBILINOGEN 1.0 06/06/2016 1012   UROBILINOGEN 0.2 12/04/2012 1755   NITRITE Negative 10/14/2018 1132   NITRITE NEGATIVE 09/11/2016 1405   LEUKOCYTESUR Trace (A) 10/14/2018 1132    Radiological Exams on Admission: No results found.  EKG: Independently reviewed.  Sinus rhythm at 72 bpm.  Nonspecific T wave flattening.  Low voltage multiple leads.  QTc 541.  Assessment/Plan Principal Problem:   AKI (acute kidney injury) ([place]) Active Problems:   Chronic hepatitis C without hepatic coma (HCC)   GERD (gastroesophageal reflux disease)   HTN (hypertension)   OSA (obstructive sleep apnea)   Generalized anxiety disorder   Major depression   Diastolic dysfunction with chronic heart failure ([place])   Morbid obesity with BMI of 60.0-69.9, adult (HCC)   Other chronic pain   Bipolar I disorder, most recent episode depressed ([place])   Insomnia due to other mental disorder   Panic disorder   AKI Hyponatremia Hypokalemia > Patient presenting with AKI and electrolyte abnormalities in the setting of 2-week water fast, which she has done before, and states is for religious reasons. > Reports drinking about a gallon a day with less water recently. > Creatinine 1.47 from baseline 1, sodium 129, potassium 2.8.  Magnesium normal. > Received 2 L of IV fluids in the ED as well as 40 mill equivalents of IV potassium. - Monitor on telemetry - Hold off on further IV fluids overnight as he has history of CHF - Add additional 40 mill equivalents p.o. potassium - Trend renal function and electrolytes  Leukocytosis > Also presenting due to leukocytosis to 20.  On repeat this is already improved to 16.5.  Suspect this is reactive in the setting  of his fasting.  His teeth extraction was about 2 weeks ago and he has no jaw pain, no fevers no other symptoms consistent with infection. - Trend  CBC  Bipolar disorder Panic disorder Anxiety Depression Insomnia - Continue home sertraline, trazodone  GERD - Continue home PPI  History of chronic hep C Nonalcoholic fatty liver disease Transaminitis > Known history of chronic hepatitis C status posttreatment around the year 2000.  Being followed with GI in the past for nonalcoholic fatty liver disease and transaminitis. > Mildly elevated LFTs of 61 for AST and ALT with alk phos of 212.  Suspect a degree of reactive elevation in the setting of his water fast as above. - A.m. LFTs   OSA - Not using CPAP  Obesity - Noted   Hypertension - Holding home Lasix and hydrochlorothiazide in the setting of AKI  CHF > Last echo was in 2018 with EF 55-60% and grade 2 diastolic dysfunction. - Holding home Lasix in the setting of AKI as above  DVT prophylaxis: Lovenox Code Status:   Full, Confirmed with patient Family Communication:  None on admission Disposition Plan:   Patient is from:  Home  Anticipated DC to:  Home   Anticipated DC date:  1 - 2 days  Anticipated DC barriers: None  Consults called:  None Admission status:  Observation, telemetry  Severity of Illness: The appropriate patient status for this patient is OBSERVATION. Observation status is judged to be reasonable and necessary in order to provide the required intensity of service to ensure the patient's safety. The patient's presenting symptoms, physical exam findings, and initial radiographic and laboratory data in the context of their medical condition is felt to place them at decreased risk for further clinical deterioration. Furthermore, it is anticipated that the patient will be medically stable for discharge from the hospital within 2 midnights of admission.   Christian Bruins MD [facility]  How to contact the [facility] Attending or Consulting provider [place] or covering provider during after hours [place], for this patient?   Check the care team in [facility]  and look for a) attending/consulting [facility] provider listed and b) the [facility] team listed Log into [email or web address] and use [place]'s universal password to access. If you do not have the password, please contact the hospital operator. Locate the [facility] provider you are looking for under [facility] and page to a number that you can be directly reached. If you still have difficulty reaching the provider, please page the [facility] (Director on Call) for the Hospitalists listed on amion for assistance.  04/30/2022, 6:53 PM

## 2022-05-01 ENCOUNTER — Observation Stay (HOSPITAL_COMMUNITY): Payer: Medicaid Other

## 2022-05-01 DIAGNOSIS — Z885 Allergy status to narcotic agent status: Secondary | ICD-10-CM | POA: Diagnosis not present

## 2022-05-01 DIAGNOSIS — N179 Acute kidney failure, unspecified: Secondary | ICD-10-CM | POA: Diagnosis not present

## 2022-05-01 DIAGNOSIS — F3130 Bipolar disorder, current episode depressed, mild or moderate severity, unspecified: Secondary | ICD-10-CM | POA: Diagnosis present

## 2022-05-01 DIAGNOSIS — Z720 Tobacco use: Secondary | ICD-10-CM | POA: Diagnosis not present

## 2022-05-01 DIAGNOSIS — Z6841 Body Mass Index (BMI) 40.0 and over, adult: Secondary | ICD-10-CM | POA: Diagnosis not present

## 2022-05-01 DIAGNOSIS — Z79899 Other long term (current) drug therapy: Secondary | ICD-10-CM | POA: Diagnosis not present

## 2022-05-01 DIAGNOSIS — E878 Other disorders of electrolyte and fluid balance, not elsewhere classified: Secondary | ICD-10-CM | POA: Diagnosis present

## 2022-05-01 DIAGNOSIS — F411 Generalized anxiety disorder: Secondary | ICD-10-CM | POA: Diagnosis present

## 2022-05-01 DIAGNOSIS — I5032 Chronic diastolic (congestive) heart failure: Secondary | ICD-10-CM | POA: Diagnosis present

## 2022-05-01 DIAGNOSIS — F410 Panic disorder [episodic paroxysmal anxiety] without agoraphobia: Secondary | ICD-10-CM | POA: Diagnosis present

## 2022-05-01 DIAGNOSIS — I110 Hypertensive heart disease with heart failure: Secondary | ICD-10-CM | POA: Diagnosis present

## 2022-05-01 DIAGNOSIS — I959 Hypotension, unspecified: Secondary | ICD-10-CM | POA: Diagnosis present

## 2022-05-01 DIAGNOSIS — G8929 Other chronic pain: Secondary | ICD-10-CM | POA: Diagnosis present

## 2022-05-01 DIAGNOSIS — B182 Chronic viral hepatitis C: Secondary | ICD-10-CM | POA: Diagnosis present

## 2022-05-01 DIAGNOSIS — K219 Gastro-esophageal reflux disease without esophagitis: Secondary | ICD-10-CM | POA: Diagnosis present

## 2022-05-01 DIAGNOSIS — K7581 Nonalcoholic steatohepatitis (NASH): Secondary | ICD-10-CM | POA: Diagnosis present

## 2022-05-01 DIAGNOSIS — F5105 Insomnia due to other mental disorder: Secondary | ICD-10-CM | POA: Diagnosis present

## 2022-05-01 DIAGNOSIS — E860 Dehydration: Secondary | ICD-10-CM | POA: Diagnosis present

## 2022-05-01 DIAGNOSIS — E876 Hypokalemia: Secondary | ICD-10-CM | POA: Diagnosis not present

## 2022-05-01 DIAGNOSIS — E871 Hypo-osmolality and hyponatremia: Secondary | ICD-10-CM | POA: Diagnosis present

## 2022-05-01 DIAGNOSIS — G4733 Obstructive sleep apnea (adult) (pediatric): Secondary | ICD-10-CM | POA: Diagnosis present

## 2022-05-01 DIAGNOSIS — Z8249 Family history of ischemic heart disease and other diseases of the circulatory system: Secondary | ICD-10-CM | POA: Diagnosis not present

## 2022-05-01 DIAGNOSIS — E872 Acidosis, unspecified: Secondary | ICD-10-CM | POA: Diagnosis present

## 2022-05-01 LAB — COMPREHENSIVE METABOLIC PANEL
ALT: 55 U/L — ABNORMAL HIGH (ref 0–44)
AST: 48 U/L — ABNORMAL HIGH (ref 15–41)
Albumin: 2.4 g/dL — ABNORMAL LOW (ref 3.5–5.0)
Alkaline Phosphatase: 165 U/L — ABNORMAL HIGH (ref 38–126)
Anion gap: 12 (ref 5–15)
BUN: 30 mg/dL — ABNORMAL HIGH (ref 8–23)
CO2: 27 mmol/L (ref 22–32)
Calcium: 8 mg/dL — ABNORMAL LOW (ref 8.9–10.3)
Chloride: 91 mmol/L — ABNORMAL LOW (ref 98–111)
Creatinine, Ser: 1.23 mg/dL (ref 0.61–1.24)
GFR, Estimated: >60 mL/min (ref >60)
Glucose, Bld: 114 mg/dL — ABNORMAL HIGH (ref 70–99)
Potassium: 3.2 mmol/L — ABNORMAL LOW (ref 3.5–5.1)
Sodium: 130 mmol/L — ABNORMAL LOW (ref 135–145)
Total Bilirubin: 1.7 mg/dL — ABNORMAL HIGH (ref 0.3–1.2)
Total Protein: 6 g/dL — ABNORMAL LOW (ref 6.5–8.1)

## 2022-05-01 LAB — GLUCOSE, CAPILLARY: Glucose-Capillary: 148 mg/dL — ABNORMAL HIGH (ref 70–99)

## 2022-05-01 LAB — CBC
HCT: 43.4 % (ref 39.0–52.0)
Hemoglobin: 14.3 g/dL (ref 13.0–17.0)
MCH: 26.7 pg (ref 26.0–34.0)
MCHC: 32.9 g/dL (ref 30.0–36.0)
MCV: 81.1 fL (ref 80.0–100.0)
Platelets: 296 10*3/uL (ref 150–400)
RBC: 5.35 MIL/uL (ref 4.22–5.81)
RDW: 16.7 % — ABNORMAL HIGH (ref 11.5–15.5)
WBC: 14.9 10*3/uL — ABNORMAL HIGH (ref 4.0–10.5)
nRBC: 0 % (ref 0.0–0.2)

## 2022-05-01 LAB — LIPASE, BLOOD: Lipase: 88 U/L — ABNORMAL HIGH (ref 11–51)

## 2022-05-01 LAB — HIV ANTIBODY (ROUTINE TESTING W REFLEX): HIV Screen 4th Generation wRfx: NONREACTIVE

## 2022-05-01 MED ORDER — PANTOPRAZOLE SODIUM 40 MG PO TBEC
40.0000 mg | DELAYED_RELEASE_TABLET | Freq: Two times a day (BID) | ORAL | Status: DC
  Administered 2022-05-01 – 2022-05-03 (×5): 40 mg via ORAL
  Filled 2022-05-01 (×5): qty 1

## 2022-05-01 MED ORDER — THIAMINE MONONITRATE 100 MG PO TABS
100.0000 mg | ORAL_TABLET | Freq: Every day | ORAL | Status: DC
  Administered 2022-05-01 – 2022-05-03 (×3): 100 mg via ORAL
  Filled 2022-05-01 (×3): qty 1

## 2022-05-01 MED ORDER — ENSURE ENLIVE PO LIQD
237.0000 mL | Freq: Two times a day (BID) | ORAL | Status: DC
  Administered 2022-05-02 – 2022-05-03 (×3): 237 mL via ORAL

## 2022-05-01 MED ORDER — ADULT MULTIVITAMIN W/MINERALS CH
1.0000 | ORAL_TABLET | Freq: Every day | ORAL | Status: DC
  Administered 2022-05-01 – 2022-05-03 (×3): 1 via ORAL
  Filled 2022-05-01 (×3): qty 1

## 2022-05-01 MED ORDER — POTASSIUM CHLORIDE CRYS ER 20 MEQ PO TBCR
40.0000 meq | EXTENDED_RELEASE_TABLET | Freq: Once | ORAL | Status: AC
  Administered 2022-05-01: 40 meq via ORAL
  Filled 2022-05-01: qty 2

## 2022-05-01 MED ORDER — DEXTROSE IN LACTATED RINGERS 5 % IV SOLN: INTRAVENOUS | Status: DC

## 2022-05-01 NOTE — Progress Notes (Signed)
PROGRESS NOTE  Christian Guerra 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  He was found to have a white count of 20 K and was directed to the hospital.  He tells me that just before Christmas on 12/20 he had 11 teeth extracted and has been feeling weak a few days afterwards.  After Christmas, he went on and started a water only fast for religious reasons.  He has been doing this for the past 15 days.  He tells me he does this every year but last year it was much easier.  He denies any jaw pain, fever, chills  Subjective / 24h Interval events: Complains of significant abdominal pain this morning, along with nausea.  He was barely able to eat anything last night due to the fact that he cannot chew and also felt sick to his stomach.  Assesement and Plan: Principal Problem:   AKI (acute kidney injury) ([place]) Active Problems:   Chronic hepatitis C without hepatic coma (HCC)   GERD (gastroesophageal reflux disease)   HTN (hypertension)   OSA (obstructive sleep apnea)   Generalized anxiety disorder   Major depression   Diastolic dysfunction with chronic heart failure ([place])   Morbid obesity with BMI of 60.0-69.9, adult (HCC)   Other chronic pain   Bipolar I disorder, most recent episode depressed ([place])   Insomnia due to other mental disorder   Panic disorder   Hypokalemia   Hyponatremia  Principal problem Acute kidney injury - in the setting of poor p.o. intake, fasting and using only water at home.  Has received fluids since yesterday, creatinine improving to 1.2 this morning.  Still unable to eat due to abdominal pain and discomfort, continue IV fluids, start D5 LR.  Continue to closely monitor still dehydrated clinically  due to elevated BUN and hypochloremia  Active problems Hyponatremia-due to excessive free water intake.  Sodium 129 on admission, improved to 130 and stable this morning.  Continue IV fluids as above, allow liquid diet  Hypokalemia-continue to replete again this morning, monitor  Abdominal pain-worse today, continue PPI, possibly in the setting of not eating anything over the last 2 weeks and now reintroducing diet.  Pain is mainly epigastric / RUQ, obtain a lipase / RUQ [place].  Downgrade from a regular diet to full liquid diet given poor dentition and he will need gradual reintroduction of food given recent fasting  Leukocytosis-no fever, no apparent infectious source.  Improving with supportive management.  Continue to monitor off antibiotics  Bipolar disorder, panic disorder, anxiety, depression, insomnia -  Continue home sertraline, trazodone.  Patient tells me that his psych issues are the main reason why he is gaining weight   GERD - Continue home PPI, increase to twice daily given prolonged fasting   History of chronic hep C, nonalcoholic fatty liver disease, transaminitis - Known history of chronic hepatitis C status posttreatment around the year 2000.  Being followed with GI in the past for nonalcoholic fatty liver disease and transaminitis.  Due to abdominal pain in the epigastric and right upper quadrant area, obtain a right upper quadrant ultrasound.  LFTs could also be elevated due to his fasting   OSA - Not using CPAP   Obesity, morbid - Noted.  He would benefit from weight loss   Hypertension - Holding home Lasix and hydrochlorothiazide in the setting of AKI   Chronic diastolic CHF  - Last echo was in 2018 with EF 55-60% and grade 2 diastolic dysfunction. Holding home Lasix in the setting of AKI as above, and he was placed on IV fluids  Scheduled Meds:  atenolol  50 mg Oral Daily   enoxaparin (LOVENOX) injection  0.5 mg/kg Subcutaneous Q24H   finasteride  5 mg Oral Daily    minocycline  100 mg Oral TID   pantoprazole  40 mg Oral BID   sertraline  150 mg Oral Daily   sodium chloride flush  3 mL Intravenous Q12H   tamsulosin  0.4 mg Oral Q1400   Continuous Infusions:  dextrose 5% lactated ringers 75 mL/hr at 05/01/22 0950   PRN Meds:.acetaminophen **OR** acetaminophen, albuterol, alum & mag hydroxide-simeth, polyethylene glycol, traZODone  Current Outpatient Medications  Medication Instructions   albuterol (VENTOLIN HFA) 108 (90 Base) MCG/ACT inhaler 1 puff, Inhalation, Every 6 hours PRN   atenolol (TENORMIN) 50 mg, Oral, Daily   cyclobenzaprine (FLEXERIL) 10 mg, Oral, 3 times daily PRN   diphenhydrAMINE (BENADRYL) 50 mg, Oral, Every 8 hours PRN   finasteride (PROSCAR) 5 mg, Daily   gabapentin (NEURONTIN) 800 mg, Oral, 3 times daily   hydrochlorothiazide (HYDRODIURIL) 25 mg, Oral, Daily   Lurasidone HCl 120 mg, Oral, Daily with breakfast   minocycline (MINOCIN) 100 mg, Oral, 3 times daily   omeprazole (PRILOSEC) 20 mg, Oral, Daily   oxyCODONE (ROXICODONE) 15 mg, Oral, Every 8 hours   sertraline (ZOLOFT) 150 mg, Oral, Daily   tamsulosin (FLOMAX) 0.4 mg, Oral, Daily   traZODone (DESYREL) 100 mg, Oral, At bedtime PRN    Diet Orders (From admission, onward)     Start     Ordered   05/01/22 0847  Diet full liquid Room service appropriate? Yes; Fluid consistency: Thin  Diet effective now       Question Answer Comment  Room service appropriate? Yes   Fluid consistency: Thin      05/01/22 0846            DVT prophylaxis: Lovenox   Lab Results  Component Value Date   PLT 296 05/01/2022      Code Status: Full Code  Family Communication: no family at bedside   Status is: Inpatient  Level of care: Med-Surg  Consultants:  none  Objective: Vitals:   04/30/22 2237 05/01/22 0251 05/01/22 0500 05/01/22 0700  BP: 126/67 126/66  135/75  Pulse: 80 77    Resp: 15 15    Temp: 97.8 F (36.6 C) 97.6 F (36.4 C)  97.8 F (36.6 C)  TempSrc:  Oral Oral  Oral  SpO2: 100% 100%    Weight: (!) 161.5 kg  (!) 161.2 kg   Height: '5\' 6"'$  (1.676 m)       Intake/Output Summary (Last 24 hours) at 05/01/2022 1044 Last data filed at 05/01/2022 4627 Gross per 24 hour  Intake 2640 ml  Output --  Net 2640 ml   Wt Readings from Last 3 Encounters:  05/01/22 (!) 161.2 kg  03/13/22 (!) 170.1 kg  11/21/21 (!) 150.1 kg    Examination:  Constitutional: NAD Eyes: no scleral icterus ENMT: Mucous membranes are moist.  Neck: normal, supple Respiratory: clear to auscultation bilaterally, no wheezing, no crackles. Normal respiratory effort. Cardiovascular: Regular rate and rhythm, no murmurs / rubs / gallops.  Trace LE edema.  Chronic venous stasis changes bilateral lower extremities Abdomen: non distended, no tenderness. Bowel sounds positive.  Musculoskeletal: no clubbing / cyanosis.   Data Reviewed: I have independently reviewed following labs and imaging studies   CBC Recent Labs  Lab 04/30/22 1128 05/01/22 0412  WBC 16.5* 14.9*  HGB 16.3 14.3  HCT 50.1 43.4  PLT 394 296  MCV 81.3 81.1  MCH 26.5 26.7  MCHC 32.5 32.9  RDW 17.5* 16.7*  LYMPHSABS 2.0  --   MONOABS 1.1*  --   EOSABS 0.0  --   BASOSABS 0.0  --     Recent Labs  Lab 04/30/22 1128 04/30/22 1512 04/30/22 2253 05/01/22 0412  NA 129*  --  130* 130*  K 2.8*  --  4.2 3.2*  CL 88*  --  89* 91*  CO2 25  --  30 27  GLUCOSE 128*  --  118* 114*  BUN 29*  --  33* 30*  CREATININE 1.47*  --  1.43* 1.23  CALCIUM 8.5*  --  8.4* 8.0*  AST 61*  --   --  48*  ALT 61*  --   --  55*  ALKPHOS 212*  --   --  165*  BILITOT 1.5*  --   --  1.7*  ALBUMIN 2.9*  --   --  2.4*  MG 2.4  --   --   --   LATICACIDVEN 3.9* 2.4*  --   --     ------------------------------------------------------------------------------------------------------------------ No results for input(s): "CHOL", "HDL", "LDLCALC", "TRIG", "CHOLHDL", "LDLDIRECT" in the last 72 hours.  Lab Results  Component  Value Date   HGBA1C 5.5 03/03/2021   ------------------------------------------------------------------------------------------------------------------ No results for input(s): "TSH", "T4TOTAL", "T3FREE", "THYROIDAB" in the last 72 hours.  Invalid input(s): "FREET3"  Cardiac Enzymes No results for input(s): "CKMB", "TROPONINI", "MYOGLOBIN" in the last 168 hours.  Invalid input(s): "CK" ------------------------------------------------------------------------------------------------------------------    Component Value Date/Time   BNP 96.9 09/11/2016 1435   BNP 9.1 06/06/2016 0918    CBG: No results for input(s): "GLUCAP" in the last 168 hours.  No results found for this or any previous visit (from the past 240 hour(s)).   Radiology Studies: No results found.   Marzetta Board, MD, PhD [facility]  Between 7 am - 7 pm I am available, please contact me via Amion (for emergencies) or Securechat (non urgent messages)  Between 7 pm - 7 am I am not available, please contact night coverage MD/APP via Amion

## 2022-05-01 NOTE — Progress Notes (Signed)
Initial Nutrition Assessment  DOCUMENTATION CODES:   Morbid obesity  INTERVENTION:  Recommend upgrading from full liquid to dysphagia 1 diet d/t poor dentition and increased nutritional offerings; discussed with MD Ensure Enlive po BID, each supplement provides 350 kcal and 20 grams of protein MVI with minerals daily Monitor magnesium, potassium, and phosphorus BID for at least 3 days, MD to replete as needed, as pt is at risk for refeeding syndrome given >15days inadequate PO intake d/t water fast. Recommend addition of thiamine given refeeding risk  NUTRITION DIAGNOSIS:   Inadequate oral intake related to other (see comment) (poor dentition, religious water fast) as evidenced by per patient/family report.  GOAL:   Patient will meet greater than or equal to 90% of their needs  MONITOR:   PO intake, Supplement acceptance, Diet advancement, Labs, Weight trends  REASON FOR ASSESSMENT:   Malnutrition Screening Tool    ASSESSMENT:   Pt admitted with abnormal labs and weakness, found to have AKI. PMH significant for bipolar disorder, panic disorder, anxiety, depression, insomnia, GERD, chronic hepatitis C, NAFLD, chronic pain, OSA, obesity, HTN, CHF  Pt pleasantly sitting up in bed at time of visit. He states that he had 11 teeth extracted on 12/20 which has made it difficult to eat, however he also reports starting a water only fast for religious reasons. He has done this in the past without difficulty for 21 days at a time but this time was only able to fast for 15 days. He was observed to have breakfast tray on table with 100% consumed (grits, ice cream, and juice). Pt was requesting well chopped/mashed eggs for breakfast. Discussed different diet modifications with pt, he asks that as long as he does not have to chew his foods he is agreeable. Discussed upgrading to a pureed diet with MD.  Pt endorses mild-moderate abdominal pain at time of visit but states that he has encountered  this his whole life.  Reviewed weight history. Pt's weight documented to be 150.1 kg on 11/21/21. It appears it increased +20 kg between that time and 03/13/22. His current documented weight is noted to be 161.2 kg.   Medications: abx, MVI, protonix, thiamine IV drips: D5 in LR '@75ml'$ /hr  Labs: sodium 130, potassium 3.2, BUN 30, alkaline phos 166, lipase 88, AST 48, ALT 56    NUTRITION - FOCUSED PHYSICAL EXAM:  Flowsheet Row Most Recent Value  Orbital Region No depletion  Upper Arm Region No depletion  Thoracic and Lumbar Region No depletion  Buccal Region No depletion  Temple Region No depletion  Clavicle Bone Region No depletion  Clavicle and Acromion Bone Region No depletion  Scapular Bone Region No depletion  Dorsal Hand No depletion  Patellar Region No depletion  Anterior Thigh Region No depletion  Posterior Calf Region No depletion  Edema (RD Assessment) Mild  [non-pitting BLE]  Hair Reviewed  Eyes Reviewed  Mouth Other (Comment)  [poor dentition]  Skin Reviewed  Nails Reviewed       Diet Order:   Diet Order             DIET - DYS 1 Room service appropriate? Yes; Fluid consistency: Thin  Diet effective now                   EDUCATION NEEDS:   Education needs have been addressed  Skin:  Skin Assessment: Reviewed RN Assessment  Last BM:  PTA  Height:   Ht Readings from Last 1 Encounters:  04/30/22 '5\' 6"'$  (  1.676 m)    Weight:   Wt Readings from Last 1 Encounters:  05/01/22 (!) 161.2 kg    Ideal Body Weight:  64.5 kg  BMI:  Body mass index is 57.36 kg/m.  Estimated Nutritional Needs:   Kcal:  2000-2200  Protein:  100-115g  Fluid:  >/=2L  Clayborne Dana, RDN, LDN Clinical Nutrition

## 2022-05-02 DIAGNOSIS — N179 Acute kidney failure, unspecified: Secondary | ICD-10-CM | POA: Diagnosis not present

## 2022-05-02 LAB — COMPREHENSIVE METABOLIC PANEL
ALT: 49 U/L — ABNORMAL HIGH (ref 0–44)
AST: 40 U/L (ref 15–41)
Albumin: 2.2 g/dL — ABNORMAL LOW (ref 3.5–5.0)
Alkaline Phosphatase: 179 U/L — ABNORMAL HIGH (ref 38–126)
Anion gap: 8 (ref 5–15)
BUN: 28 mg/dL — ABNORMAL HIGH (ref 8–23)
CO2: 31 mmol/L (ref 22–32)
Calcium: 8.2 mg/dL — ABNORMAL LOW (ref 8.9–10.3)
Chloride: 93 mmol/L — ABNORMAL LOW (ref 98–111)
Creatinine, Ser: 1.22 mg/dL (ref 0.61–1.24)
GFR, Estimated: >60 mL/min (ref >60)
Glucose, Bld: 108 mg/dL — ABNORMAL HIGH (ref 70–99)
Potassium: 4.3 mmol/L (ref 3.5–5.1)
Sodium: 132 mmol/L — ABNORMAL LOW (ref 135–145)
Total Bilirubin: 1.1 mg/dL (ref 0.3–1.2)
Total Protein: 5.6 g/dL — ABNORMAL LOW (ref 6.5–8.1)

## 2022-05-02 LAB — CBC
HCT: 39.7 % (ref 39.0–52.0)
Hemoglobin: 13 g/dL (ref 13.0–17.0)
MCH: 26.6 pg (ref 26.0–34.0)
MCHC: 32.7 g/dL (ref 30.0–36.0)
MCV: 81.4 fL (ref 80.0–100.0)
Platelets: 237 10*3/uL (ref 150–400)
RBC: 4.88 MIL/uL (ref 4.22–5.81)
RDW: 17 % — ABNORMAL HIGH (ref 11.5–15.5)
WBC: 11.1 10*3/uL — ABNORMAL HIGH (ref 4.0–10.5)
nRBC: 0 % (ref 0.0–0.2)

## 2022-05-02 LAB — PHOSPHORUS
Phosphorus: 2.5 mg/dL (ref 2.5–4.6)
Phosphorus: 1.9 mg/dL — ABNORMAL LOW (ref 2.5–4.6)

## 2022-05-02 LAB — MAGNESIUM
Magnesium: 2.1 mg/dL (ref 1.7–2.4)
Magnesium: 2 mg/dL (ref 1.7–2.4)

## 2022-05-02 MED ORDER — SENNOSIDES-DOCUSATE SODIUM 8.6-50 MG PO TABS
1.0000 | ORAL_TABLET | Freq: Two times a day (BID) | ORAL | Status: DC
  Administered 2022-05-02: 1 via ORAL
  Filled 2022-05-02 (×2): qty 1

## 2022-05-02 MED ORDER — ENOXAPARIN SODIUM 80 MG/0.8ML IJ SOSY
80.0000 mg | PREFILLED_SYRINGE | INTRAMUSCULAR | Status: DC
  Administered 2022-05-02: 80 mg via SUBCUTANEOUS
  Filled 2022-05-02 (×2): qty 0.8

## 2022-05-02 NOTE — Evaluation (Signed)
Physical Therapy Evaluation Patient Details Name: Christian Guerra MRN: [id number] DOB: [birth year] Today's Date: 05/02/2022  History of Present Illness  Pt is a 62 y.o. male admitted 04/30/22 with weakness; pt reports fasting for past 15 days for religious reasons. Workup for AKI. PMH includes HTN, CHF, Hep C, memory difficulties, obesity, anxiety, depression, bipolar disorder, panic disorder.   Clinical Impression  Pt presents with an overall decrease in functional mobility secondary to above. PTA, pt ambulatory without DME, though reports distance limited secondary to chronic back pain; pt lives with parents and brother. Today, pt independent with mobility in room, but declines further ambulation distance secondary to pain and fatigue. Increased time discussing current functional mobility status and activity recommendations - pt endorses desire to change (regarding increasing mobility, less sedentary lifestyle, weight loss), but is contemplating change not necessarily ready to commit to action. Pt would benefit from continued acute PT services to maximize functional mobility and independence prior to d/c with outpatient PT.      Recommendations for follow up therapy are one component of a multi-disciplinary discharge planning process, led by the attending physician.  Recommendations may be updated based on patient status, additional functional criteria and insurance authorization.  Follow Up Recommendations Outpatient PT      Assistance Recommended at Discharge PRN  Patient can return home with the following  Assistance with cooking/housework;Assist for transportation;Help with stairs or ramp for entrance    Equipment Recommendations None recommended by PT  Recommendations for Other Services       Functional Status Assessment Patient has had a recent decline in their functional status and demonstrates the ability to make significant improvements in function in a reasonable and predictable  amount of time.     Precautions / Restrictions Precautions Precautions: Fall Restrictions Weight Bearing Restrictions: No      Mobility  Bed Mobility Overal bed mobility: Modified Independent             General bed mobility comments: supine<>sit with HOB slightly elevated    Transfers Overall transfer level: Independent Equipment used: None                    Ambulation/Gait Ambulation/Gait assistance: Independent Gait Distance (Feet): 60 Feet Assistive device: None Gait Pattern/deviations: Step-through pattern, Decreased stride length, Trunk flexed Gait velocity: Decreased     General Gait Details: slow, antalgic gait without DME; pt starting to sit down after walk to/from bathroom requiring encouragement to stay up and walk more in room; pt reports having to sit due to pain, adamantly declines additional ambulation  Stairs            Wheelchair Mobility    Modified Rankin (Stroke Patients Only)       Balance Overall balance assessment: Needs assistance Sitting-balance support: No upper extremity supported, Feet supported Sitting balance-Leahy Scale: Good     Standing balance support: No upper extremity supported, During functional activity Standing balance-Leahy Scale: Fair Standing balance comment: indep to void in bathroom                             Pertinent Vitals/Pain Pain Assessment Pain Assessment: Faces Faces Pain Scale: Hurts little more Pain Location: back Pain Descriptors / Indicators: Discomfort, Grimacing, Guarding Pain Intervention(s): Monitored during session    Home Living Family/patient expects to be discharged to:: Private residence Living Arrangements: Parent;Other relatives Available Help at Discharge: Family;Available PRN/intermittently Type of Home: [place]  Access: Stairs to enter   [employer] of Steps: 3   Home Layout: One level Home Equipment: Shower seat Additional  Comments: "my brother built me a seat to sit on in the tub." lives with father (recently d/c home from hospital after stroke), step-mother (has Alzheimer's) and brother    Prior Function Prior Level of Function : Independent/Modified Independent;Driving             Mobility Comments: limited household and community ambulator indep without DME ("I can stand/walk for about 3 min before I have to sit due to excruciating back pain"; pt reports sitting is only way to alleviate pain, has to sit for ~5 min before he could get up again). family reports pt sits in recliner majority of day. drives if he has to get to grocery store, otherwise does not leave house (pt reports getting outside 4-5x/month). uses scooter at store ADLs Comments: mod indep, sits to bathe     Hand Dominance        Extremity/Trunk Assessment   Upper Extremity Assessment Upper Extremity Assessment: Overall WFL for tasks assessed    Lower Extremity Assessment Lower Extremity Assessment: Overall WFL for tasks assessed       Communication   Communication: No difficulties  Cognition Arousal/Alertness: Awake/alert Behavior During Therapy: WFL for tasks assessed/performed Overall Cognitive Status: Within Functional Limits for tasks assessed                                 General Comments: expresses desire to change lifestyle and ability to move more, but ultimately not willing to progress activity today, reports "too tired"        General Comments General comments (skin integrity, edema, etc.): pt's cousin briefly present during session. increased time discussing current functional mobility status with pt - pt endorses desire to change (regarding increasing mobility, less sedentary lifestyle, weight loss), but is contemplating change not necessarily ready to commit to action. does express interest in OPPT    Exercises     Assessment/Plan    PT Assessment Patient needs continued PT services  PT  Problem List Decreased activity tolerance;Decreased mobility;Pain       PT Treatment Interventions DME instruction;Gait training;Stair training;Functional mobility training;Therapeutic activities;Therapeutic exercise;Patient/family education    PT Goals (Current goals can be found in the Care Plan section)  Acute Rehab PT Goals Patient Stated Goal: lay back down PT Goal Formulation: With patient Time For Goal Achievement: 05/16/22 Potential to Achieve Goals: Good    Frequency Min 3X/week     Co-evaluation               AM-PAC PT "6 Clicks" Mobility  Outcome Measure Help needed turning from your back to your side while in a flat bed without using bedrails?: None Help needed moving from lying on your back to sitting on the side of a flat bed without using bedrails?: None Help needed moving to and from a bed to a chair (including a wheelchair)?: None Help needed standing up from a chair using your arms (e.g., wheelchair or bedside chair)?: None Help needed to walk in hospital room?: None Help needed climbing 3-5 steps with a railing? : A Little 6 Click Score: 23    End of Session   Activity Tolerance: Patient limited by pain Patient left: in bed;with call bell/phone within reach Nurse Communication: Mobility status PT Visit Diagnosis: Other abnormalities of gait and mobility (R26.89);Pain  Time: 6389-3734 PT Time Calculation (min) (ACUTE ONLY): 13 min   Charges:   PT Evaluation $PT Eval Low Complexity: [place], PT, DPT Acute Rehabilitation Services  Personal: [place] [facility] Office: [place] 05/02/2022, 4:48 PM

## 2022-05-02 NOTE — Progress Notes (Signed)
CSW met with pt regarding [place]: food insecurity.  Per pt, he does not have problems getting food on a regular basis.  Pt has been fasting for religious reasons prior to admission, but this was not due to being unable to obtain adequate food.  Pt lives with father, brother, step mother and there is adequate food in the home at all times. Lurline Idol, MSW, LCSW 1/10/202411:21 AM
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
## 2022-05-02 NOTE — Progress Notes (Signed)
Encouraged patient to get out of bed and walk in the hallway with this RN. Patient refused, he states "probably not, I have this back pain for about 15 yrs now and I have to sit after 3 minutes.  I will walk to the bathroom and back but nothing more than that right now".  Dr. Eliseo Squires made aware.

## 2022-05-02 NOTE — Progress Notes (Signed)
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  He was found to have a white count of 20 K and was directed to the hospital.  He tells me that just before Christmas on 12/20 he had 11 teeth extracted and has been feeling weak a few days afterwards.  After Christmas, he went on and started a water only fast for religious reasons.  He has been doing this for the past 15 days.  He tells me he does this every year but last year it was much easier.  He denies any jaw pain, fever, chills  Subjective / 24h Interval events: Eating better No RUQ pain so doubt cholecystitis   Assesement and Plan: Principal Problem:   AKI (acute kidney injury) ([place]) Active Problems:   Chronic hepatitis C without hepatic coma (HCC)   GERD (gastroesophageal reflux disease)   HTN (hypertension)   OSA (obstructive sleep apnea)   Generalized anxiety disorder   Major depression   Diastolic dysfunction with chronic heart failure ([place])   Morbid obesity with BMI of 60.0-69.9, adult (HCC)   Other chronic pain   Bipolar I disorder, most recent episode depressed ([place])   Insomnia due to other mental disorder   Panic disorder   Hypokalemia   Hyponatremia   Acute kidney injury - in the setting of poor p.o. intake, fasting and using only water at home.  Has received fluids since yesterday, creatinine improving -finish current bag and d/c  Hyponatremia-due to excessive free water intake.  -improving  Hypokalemia -repleted  Abdominal pain- -resolved -continue PPI - possibly in the setting of not eating anything over the last 2 weeks and now reintroducing diet.   -RUQ done-- no sign of  cholecystitis  Leukocytosis -improving without abx  Bipolar disorder, panic disorder, anxiety, depression, insomnia -  Continue home sertraline, trazodone.  Patient tells me that his psych issues are the main reason why he is gaining weight   GERD - Continue home PPI, increase to twice daily given prolonged fasting   History of chronic hep C, nonalcoholic fatty liver disease, transaminitis - Known history of chronic hepatitis C status posttreatment around the year 2000.  Being followed with GI in the past for nonalcoholic fatty liver disease and transaminitis.  -outpatient follow up   OSA - Not using CPAP   Obesity, morbid - Noted.  He would benefit from weight loss   Hypertension - Holding home Lasix and hydrochlorothiazide in the setting of AKI   Chronic diastolic CHF  - Last echo was in 2018 with EF 55-60% and grade 2 diastolic dysfunction. Holding home Lasix in the setting of AKI as above, and he was placed on IV fluids    Scheduled Meds:  atenolol  50 mg Oral Daily   enoxaparin (LOVENOX) injection  80 mg Subcutaneous Q24H   feeding supplement  237 mL Oral BID BM   finasteride  5 mg Oral Daily   minocycline  100 mg Oral TID   multivitamin with minerals  1 tablet Oral Daily   pantoprazole  40 mg Oral BID   sertraline  150 mg Oral Daily   sodium chloride flush  3 mL Intravenous Q12H   tamsulosin  0.4 mg Oral  Q1400   thiamine  100 mg Oral Daily   Continuous Infusions:  dextrose 5% lactated ringers 75 mL/hr at 05/01/22 2320   PRN Meds:.acetaminophen **OR** acetaminophen, albuterol, alum & mag hydroxide-simeth, polyethylene glycol, traZODone  Current Outpatient Medications  Medication Instructions   albuterol (VENTOLIN HFA) 108 (90 Base) MCG/ACT inhaler 1 puff, Inhalation, Every 6 hours PRN   atenolol (TENORMIN) 50 mg, Oral, Daily   cyclobenzaprine (FLEXERIL) 10 mg, Oral, 3 times daily PRN   diphenhydrAMINE (BENADRYL) 50 mg, Oral, Every 8 hours PRN   finasteride (PROSCAR)  5 mg, Daily   gabapentin (NEURONTIN) 800 mg, Oral, 3 times daily   hydrochlorothiazide (HYDRODIURIL) 25 mg, Oral, Daily   Lurasidone HCl 120 mg, Oral, Daily with breakfast   minocycline (MINOCIN) 100 mg, Oral, 3 times daily   omeprazole (PRILOSEC) 20 mg, Oral, Daily   oxyCODONE (ROXICODONE) 15 mg, Oral, Every 8 hours   sertraline (ZOLOFT) 150 mg, Oral, Daily   tamsulosin (FLOMAX) 0.4 mg, Oral, Daily   traZODone (DESYREL) 100 mg, Oral, At bedtime PRN    Diet Orders (From admission, onward)     Start     Ordered   05/01/22 1513  DIET - DYS 1 Room service appropriate? Yes; Fluid consistency: Thin  Diet effective now       Question Answer Comment  Room service appropriate? Yes   Fluid consistency: Thin      05/01/22 1512            DVT prophylaxis: Lovenox   Lab Results  Component Value Date   PLT 237 05/02/2022      Code Status: Full Code  Family Communication: no family at bedside   Status is: Inpatient  Level of care: Med-Surg  Consultants:  none  Objective: Vitals:   05/01/22 0700 05/01/22 1300 05/01/22 2022 05/02/22 0500  BP: 135/75 138/76 124/61   Pulse:   81   Resp:   20   Temp: 97.8 F (36.6 C) 97.6 F (36.4 C) (!) 97.4 F (36.3 C)   TempSrc: Oral Oral Oral   SpO2:   100%   Weight:    (!) 163.8 kg  Height:        Intake/Output Summary (Last 24 hours) at 05/02/2022 1018 Last data filed at 05/02/2022 1000 Gross per 24 hour  Intake 2921.77 ml  Output --  Net 2921.77 ml   Wt Readings from Last 3 Encounters:  05/02/22 (!) 163.8 kg  03/13/22 (!) 170.1 kg  11/21/21 (!) 150.1 kg    Examination:   General: Appearance:    Severely obese male in no acute distress     Lungs:     respirations unlabored  Heart:    Normal heart rate. Normal rhythm. No murmurs, rubs, or gallops.   MS:   All extremities are intact.   Neurologic:   Awake, alert    Data Reviewed: I have independently reviewed following labs and imaging studies   CBC Recent Labs   Lab 04/30/22 1128 05/01/22 0412 05/02/22 0427  WBC 16.5* 14.9* 11.1*  HGB 16.3 14.3 13.0  HCT 50.1 43.4 39.7  PLT 394 296 237  MCV 81.3 81.1 81.4  MCH 26.5 26.7 26.6  MCHC 32.5 32.9 32.7  RDW 17.5* 16.7* 17.0*  LYMPHSABS 2.0  --   --   MONOABS 1.1*  --   --   EOSABS 0.0  --   --   BASOSABS 0.0  --   --     Recent Labs  Lab 04/30/22 1128 04/30/22 1512 04/30/22 2253 05/01/22 0412 05/02/22 0427  NA 129*  --  130* 130* 132*  K 2.8*  --  4.2 3.2* 4.3  CL 88*  --  89* 91* 93*  CO2 25  --  '30 27 31  '$ GLUCOSE 128*  --  118* 114* 108*  BUN 29*  --  33* 30* 28*  CREATININE 1.47*  --  1.43* 1.23 1.22  CALCIUM 8.5*  --  8.4* 8.0* 8.2*  AST 61*  --   --  48* 40  ALT 61*  --   --  55* 49*  ALKPHOS 212*  --   --  165* 179*  BILITOT 1.5*  --   --  1.7* 1.1  ALBUMIN 2.9*  --   --  2.4* 2.2*  MG 2.4  --   --   --  2.1  LATICACIDVEN 3.9* 2.4*  --   --   --     ------------------------------------------------------------------------------------------------------------------ No results for input(s): "CHOL", "HDL", "LDLCALC", "TRIG", "CHOLHDL", "LDLDIRECT" in the last 72 hours.  Lab Results  Component Value Date   HGBA1C 5.5 03/03/2021   ------------------------------------------------------------------------------------------------------------------ No results for input(s): "TSH", "T4TOTAL", "T3FREE", "THYROIDAB" in the last 72 hours.  Invalid input(s): "FREET3"  Cardiac Enzymes No results for input(s): "CKMB", "TROPONINI", "MYOGLOBIN" in the last 168 hours.  Invalid input(s): "CK" ------------------------------------------------------------------------------------------------------------------    Component Value Date/Time   BNP 96.9 09/11/2016 1435   BNP 9.1 06/06/2016 0918    CBG: Recent Labs  Lab 05/01/22 2015  GLUCAP 148*    No results found for this or any previous visit (from the past 240 hour(s)).   Radiology Studies: US Abdomen Limited RUQ  (LIVER/GB)  Result Date: 05/01/2022 CLINICAL DATA:  Elevated LFTs EXAM: ULTRASOUND ABDOMEN LIMITED RIGHT UPPER QUADRANT COMPARISON:  CT 04/26/2021 FINDINGS: Gallbladder: Mildly distended gallbladder with intraluminal sludge and small stones. Reportedly positive sonographic Murphy sign. Common bile duct: Diameter: 4.6 mm, normal.  No intrahepatic ductal dilation. Liver: Diffusely increased liver echogenicity. Portal vein is patent on color Doppler imaging with normal direction of blood flow towards the liver. Other: Limited exam due to body habitus. IMPRESSION: Limited exam due to body habitus. The gallbladder is mildly distended with intraluminal sludge and small stones. Reportedly positive sonographic Murphy sign, correlate with physical exam. Cholecystitis is possible. HIDA scan could be considered for further evaluation. Diffuse hepatic steatosis. Electronically Signed   By: Maurine Simmering M.D.   On: 05/01/2022 11:13     Eulogio Bear [facility]  Between 7 am - 7 pm I am available, please contact me via Amion (for emergencies) or Securechat (non urgent messages)  Between 7 pm - 7 am I am not available, please contact night coverage MD/APP via Amion

## 2022-05-03 DIAGNOSIS — N179 Acute kidney failure, unspecified: Secondary | ICD-10-CM | POA: Diagnosis not present

## 2022-05-03 LAB — CBC
HCT: 39.5 % (ref 39.0–52.0)
Hemoglobin: 12.8 g/dL — ABNORMAL LOW (ref 13.0–17.0)
MCH: 27 pg (ref 26.0–34.0)
MCHC: 32.4 g/dL (ref 30.0–36.0)
MCV: 83.3 fL (ref 80.0–100.0)
Platelets: 212 10*3/uL (ref 150–400)
RBC: 4.74 MIL/uL (ref 4.22–5.81)
RDW: 17.2 % — ABNORMAL HIGH (ref 11.5–15.5)
WBC: 8.2 10*3/uL (ref 4.0–10.5)
nRBC: 0.2 % (ref 0.0–0.2)

## 2022-05-03 LAB — BASIC METABOLIC PANEL
Anion gap: 8 (ref 5–15)
BUN: 26 mg/dL — ABNORMAL HIGH (ref 8–23)
CO2: 32 mmol/L (ref 22–32)
Calcium: 8.1 mg/dL — ABNORMAL LOW (ref 8.9–10.3)
Chloride: 94 mmol/L — ABNORMAL LOW (ref 98–111)
Creatinine, Ser: 1.14 mg/dL (ref 0.61–1.24)
GFR, Estimated: >60 mL/min (ref >60)
Glucose, Bld: 94 mg/dL (ref 70–99)
Potassium: 4.2 mmol/L (ref 3.5–5.1)
Sodium: 134 mmol/L — ABNORMAL LOW (ref 135–145)

## 2022-05-03 LAB — MAGNESIUM: Magnesium: 2.1 mg/dL (ref 1.7–2.4)

## 2022-05-03 LAB — PHOSPHORUS: Phosphorus: 2.5 mg/dL (ref 2.5–4.6)

## 2022-05-03 MED ORDER — HYDROCHLOROTHIAZIDE 25 MG PO TABS: 25.0000 mg | ORAL_TABLET | Freq: Every day | ORAL | Status: AC

## 2022-05-03 MED ORDER — POTASSIUM PHOSPHATES 15 MMOLE/5ML IV SOLN
30.0000 mmol | Freq: Once | INTRAVENOUS | Status: DC
  Filled 2022-05-03: qty 10

## 2022-05-03 MED ORDER — PANTOPRAZOLE SODIUM 40 MG PO TBEC: 40.0000 mg | DELAYED_RELEASE_TABLET | Freq: Two times a day (BID) | ORAL | 1 refills | Status: AC

## 2022-05-03 NOTE — TOC Transition Note (Signed)
Transition of Care [facility]) - CM/SW Discharge Note   Patient Details  Name: Christian Guerra MRN: [id number] Date of Birth: 1961/02/28  Transition of Care [facility]) CM/SW Contact:  Sharin Mons, RN Phone Number: 05/03/2022, 10:30 AM   Clinical Narrative:    Patient will DC to: home Anticipated DC date: 05/03/2021 Family notified: yes Transport by: car  Per MD patient ready for DC today. RN, patient, and  patient's family of DC. Pt not agreeable to outpatient PT 2/2 no transportation. Requesting home health services. Md made aware and orders noted. Pt without provider preference. Referral made with [facility] and accepted, SOC mid next week, MD made aware. Pt without DME needs. Post hospital f/u noted on AVS. Pt without RX med concerns. Brother to provide transportation to home.  RNCM will sign off for now as intervention is no longer needed. Please consult [place] again if new needs arise.    Final next level of care: [place] Barriers to Discharge: No Barriers Identified   Patient Goals and CMS Choice   Choice offered to / list presented to : Patient  Discharge Placement                         Discharge Plan and Services Additional resources added to the After Visit Summary for   In-house Referral: Clinical Social Work   Post Acute Care Choice:  (TBD)                    HH Arranged: PT, RN [place] Agency: Council Date [place]: 05/03/22 Time [place]: 1030 Representative spoke with at [place]: [place] Determinants of Health ([place]) Interventions SDOH Screenings   Food Insecurity: Food Insecurity Present (08/30/2020)  Housing: Medium Risk (08/30/2020)  Transportation Needs: No Transportation Needs (08/30/2020)  Alcohol Screen: Low Risk  (08/30/2020)  Depression (PHQ2-9): High Risk (11/29/2021)  Financial Resource Strain: High Risk (08/30/2020)  Physical Activity: Inactive (08/30/2020)  Social  Connections: Socially Isolated (08/30/2020)  Stress: Stress Concern Present (08/30/2020)  Tobacco Use: High Risk (04/30/2022)     Readmission Risk Interventions     No data to display

## 2022-05-03 NOTE — Discharge Summary (Signed)
Physician Discharge Summary  Christian Guerra [facility] [id number] DOB: Aug 22, 1960 DOA: 04/30/2022  PCP: [name], MD  Admit date: 04/30/2022 Discharge date: 05/03/2022  Admitted From: home Discharge disposition: home   Recommendations for Outpatient Follow-Up:   Home health BMP 1 week   Discharge Diagnosis:   Principal Problem:   AKI (acute kidney injury) ([place]) Active Problems:   Chronic hepatitis C without hepatic coma (HCC)   GERD (gastroesophageal reflux disease)   HTN (hypertension)   OSA (obstructive sleep apnea)   Generalized anxiety disorder   Major depression   Diastolic dysfunction with chronic heart failure ([place])   Morbid obesity with BMI of 60.0-69.9, adult ([place])   Other chronic pain   Bipolar I disorder, most recent episode depressed ([place])   Insomnia due to other mental disorder   Panic disorder   Hypokalemia   Hyponatremia    Discharge Condition: Improved.  Diet recommendation: Low sodium, heart healthy  Wound care: None.  Code status: Full.   History of Present Illness:   Christian Guerra is a 62 y.o. male with medical history significant of bipolar disorder, panic disorder, anxiety, depression, insomnia, GERD, chronic hepatitis C, nonalcoholic fatty liver disease, chronic pain, OSA, obesity, hypertension, CHF presenting with abnormal labs and weakness.   Patient was instructed to seek further evaluation due to elevated white count of 20.  Patient reports that on 12/20 he had 11 teeth extracted and has been feeling weak starting a few days after that.  This was in the setting of a water only fast that he began for "religious reasons "following the teeth extraction.  He states he has been able to fast for 15 days.  Chart review says he has a history of this.  He reports drinking about a gallon of water a day but less the last couple days.   He denies any jaw pain, fevers, chills, chest pain, shortness of breath, abdominal pain,  constipation, diarrhea, nausea, vomiting.   Hospital Course by Problem:   Acute kidney injury - in the setting of poor p.o. intake, fasting and using only water at home.   -resolved -resume HCTz in 1 week   Hyponatremia-due to excessive free water intake.  -improving   Hypokalemia -repleted   Abdominal pain- -resolved -continue PPI - possibly in the setting of not eating anything over the last 2 weeks and now reintroducing diet.   -RUQ done-- no sign of cholecystitis   Leukocytosis -improving without abx   Bipolar disorder, panic disorder, anxiety, depression, insomnia -  Continue home sertraline, trazodone.  Patient tells me that his psych issues are the main reason why he is gaining weight   GERD - Continue home PPI, increase to twice daily given prolonged fasting   History of chronic hep C, nonalcoholic fatty liver disease, transaminitis - Known history of chronic hepatitis C status posttreatment around the year 2000.  Being followed with GI in the past for nonalcoholic fatty liver disease and transaminitis.  -outpatient follow up   OSA - Not using CPAP   Obesity, morbid - Noted.  He would benefit from weight loss   Hypertension - Holding home Lasix and hydrochlorothiazide in the setting of AKI   Chronic diastolic CHF  - Last echo was in 2018 with EF 55-60% and grade 2 diastolic dysfunction. Holding home HCTZ in the setting of AKI as above, and he was placed on IV fluids    Medical Consultants:      Discharge Exam:  Vitals:   05/03/22 0441 05/03/22 0828  BP: 115/60 108/68  Pulse: 83 95  Resp: 19 20  Temp: 97.6 F (36.4 C) 97.7 F (36.5 C)  SpO2: 100% 100%   Vitals:   05/02/22 1521 05/02/22 1953 05/03/22 0441 05/03/22 0828  BP: 122/60 (!) 113/50 115/60 108/68  Pulse: 80 82 83 95  Resp: '18 17 19 20  '$ Temp: 98.5 F (36.9 C) 97.6 F (36.4 C) 97.6 F (36.4 C) 97.7 F (36.5 C)  TempSrc: Oral Oral Oral Oral  SpO2: 100% 100% 100% 100%  Weight:       Height:        General exam: Appears calm and comfortable.    The results of significant diagnostics from this hospitalization (including imaging, microbiology, ancillary and laboratory) are listed below for reference.     Procedures and Diagnostic Studies:   US Abdomen Limited RUQ (LIVER/GB)  Result Date: 05/01/2022 CLINICAL DATA:  Elevated LFTs EXAM: ULTRASOUND ABDOMEN LIMITED RIGHT UPPER QUADRANT COMPARISON:  CT 04/26/2021 FINDINGS: Gallbladder: Mildly distended gallbladder with intraluminal sludge and small stones. Reportedly positive sonographic Murphy sign. Common bile duct: Diameter: 4.6 mm, normal.  No intrahepatic ductal dilation. Liver: Diffusely increased liver echogenicity. Portal vein is patent on color Doppler imaging with normal direction of blood flow towards the liver. Other: Limited exam due to body habitus. IMPRESSION: Limited exam due to body habitus. The gallbladder is mildly distended with intraluminal sludge and small stones. Reportedly positive sonographic Murphy sign, correlate with physical exam. Cholecystitis is possible. HIDA scan could be considered for further evaluation. Diffuse hepatic steatosis. Electronically Signed   By: Maurine Simmering M.D.   On: 05/01/2022 11:13     Labs:   Basic Metabolic Panel: Recent Labs  Lab 04/30/22 1128 04/30/22 2253 05/01/22 0412 05/02/22 0427 05/02/22 1642 05/03/22 0459  NA 129* 130* 130* 132*  --  134*  K 2.8* 4.2 3.2* 4.3  --  4.2  CL 88* 89* 91* 93*  --  94*  CO2 '25 30 27 31  '$ --  32  GLUCOSE 128* 118* 114* 108*  --  94  BUN 29* 33* 30* 28*  --  26*  CREATININE 1.47* 1.43* 1.23 1.22  --  1.14  CALCIUM 8.5* 8.4* 8.0* 8.2*  --  8.1*  MG 2.4  --   --  2.1 2.0 2.1  PHOS  --   --   --  2.5 1.9* 2.5   GFR Estimated Creatinine Clearance: 99.9 mL/min (by C-G formula based on SCr of 1.14 mg/dL). Liver Function Tests: Recent Labs  Lab 04/30/22 1128 05/01/22 0412 05/02/22 0427  AST 61* 48* 40  ALT 61* 55* 49*  ALKPHOS  212* 165* 179*  BILITOT 1.5* 1.7* 1.1  PROT 7.7 6.0* 5.6*  ALBUMIN 2.9* 2.4* 2.2*   Recent Labs  Lab 05/01/22 0412  LIPASE 88*   No results for input(s): "AMMONIA" in the last 168 hours. Coagulation profile No results for input(s): "INR", "PROTIME" in the last 168 hours.  CBC: Recent Labs  Lab 04/30/22 1128 05/01/22 0412 05/02/22 0427 05/03/22 0459  WBC 16.5* 14.9* 11.1* 8.2  NEUTROABS 13.2*  --   --   --   HGB 16.3 14.3 13.0 12.8*  HCT 50.1 43.4 39.7 39.5  MCV 81.3 81.1 81.4 83.3  PLT 394 296 237 212   Cardiac Enzymes: Recent Labs  Lab 04/30/22 1128  CKTOTAL 24*   BNP: Invalid input(s): "POCBNP" CBG: Recent Labs  Lab 05/01/22 2015  GLUCAP 148*  D-Dimer No results for input(s): "DDIMER" in the last 72 hours. Hgb A1c No results for input(s): "HGBA1C" in the last 72 hours. Lipid Profile No results for input(s): "CHOL", "HDL", "LDLCALC", "TRIG", "CHOLHDL", "LDLDIRECT" in the last 72 hours. Thyroid function studies No results for input(s): "TSH", "T4TOTAL", "T3FREE", "THYROIDAB" in the last 72 hours.  Invalid input(s): "FREET3" Anemia work up No results for input(s): "VITAMINB12", "FOLATE", "FERRITIN", "TIBC", "IRON", "RETICCTPCT" in the last 72 hours. Microbiology No results found for this or any previous visit (from the past 240 hour(s)).   Discharge Instructions:   Discharge Instructions     Diet - low sodium heart healthy   Complete by: As directed    Increase activity slowly   Complete by: As directed       Allergies as of 05/03/2022       Reactions   Codeine Itching        Medication List     STOP taking these medications    gabapentin 800 MG tablet Commonly known as: NEURONTIN   Lurasidone HCl 120 MG Tabs   omeprazole 20 MG capsule Commonly known as: PRILOSEC Replaced by: pantoprazole 40 MG tablet       TAKE these medications    albuterol 108 (90 Base) MCG/ACT inhaler Commonly known as: VENTOLIN HFA Inhale 1 puff  into the lungs every 6 (six) hours as needed for wheezing or shortness of breath.   atenolol 50 MG tablet Commonly known as: TENORMIN Take 1 tablet (50 mg total) by mouth daily.   cyclobenzaprine 10 MG tablet Commonly known as: FLEXERIL Take 10 mg by mouth 3 (three) times daily as needed for muscle spasms.   diphenhydrAMINE 25 MG tablet Commonly known as: BENADRYL Take 50 mg by mouth every 8 (eight) hours as needed for itching or sleep.   finasteride 5 MG tablet Commonly known as: PROSCAR Take 5 mg by mouth daily.   hydrochlorothiazide 25 MG tablet Commonly known as: HYDRODIURIL Take 1 tablet (25 mg total) by mouth daily. Start taking on: May 09, 2022 What changed: These instructions start on May 09, 2022. If you are unsure what to do until then, ask your doctor or other care provider.   minocycline 100 MG capsule Commonly known as: MINOCIN Take 100 mg by mouth 3 (three) times daily.   oxyCODONE 15 MG immediate release tablet Commonly known as: ROXICODONE Take 15 mg by mouth every 8 (eight) hours.   pantoprazole 40 MG tablet Commonly known as: PROTONIX Take 1 tablet (40 mg total) by mouth 2 (two) times daily. Replaces: omeprazole 20 MG capsule   sertraline 50 MG tablet Commonly known as: Zoloft Take 3 tablets (150 mg total) by mouth daily.   tamsulosin 0.4 MG Caps capsule Commonly known as: FLOMAX Take 1 capsule (0.4 mg total) by mouth daily at 2 PM.   traZODone 100 MG tablet Commonly known as: DESYREL Take 1 tablet (100 mg total) by mouth at bedtime as needed for sleep.        Follow-up Information     [name], MD Follow up.   Specialty: Family Medicine Contact information: [place]  [phone]                  Time coordinating discharge: 45 min  Signed:  Geradine Girt DO  [facility] 05/03/2022, 8:47 AM

## 2022-05-03 NOTE — Plan of Care (Signed)

## 2022-05-03 NOTE — Plan of Care (Signed)
  Problem: Health Behavior/Discharge Planning: Goal: Ability to manage health-related needs will improve Outcome: Adequate for Discharge   Problem: Clinical Measurements: Goal: Will remain free from infection Outcome: Adequate for Discharge   Problem: Nutrition: Goal: Adequate nutrition will be maintained Outcome: Adequate for Discharge   Problem: Elimination: Goal: Will not experience complications related to bowel motility Outcome: Adequate for Discharge Goal: Will not experience complications related to urinary retention Outcome: Adequate for Discharge

## 2022-05-03 NOTE — Progress Notes (Signed)
Physical Therapy Treatment Patient Details Name: Christian Guerra MRN: [id number] DOB: 08/18/1960 Today's Date: 05/03/2022   History of Present Illness Pt is a 62 y.o. male admitted 04/30/22 with weakness; pt reports fasting for past 15 days for religious reasons. Workup for AKI. PMH includes HTN, CHF, Hep C, memory difficulties, obesity, anxiety, depression, bipolar disorder, panic disorder.    PT Comments    Pt independent with mobility within room. Continues to refuse to ambulate out in hallway and have some concerns that pt can be somewhat self limiting. Spent some time discussed how to set measurable goals at home and how to advance mobility as well as keeping records of his progress. He wants to be healthier and in better shape and received this well. Pt's father recently had a stroke and just came home from the hospital. It sounds like pt may have transportation issues getting to outpt PT as his brother will be the only driver in the home. Changing recommendation to HHPT for this reason. PT will continue to follow.   Recommendations for follow up therapy are one component of a multi-disciplinary discharge planning process, led by the attending physician.  Recommendations may be updated based on patient status, additional functional criteria and insurance authorization.  Follow Up Recommendations  Home health PT     Assistance Recommended at Discharge PRN  Patient can return home with the following Assistance with cooking/housework;Assist for transportation;Help with stairs or ramp for entrance   Equipment Recommendations  None recommended by PT    Recommendations for Other Services       Precautions / Restrictions Precautions Precautions: Fall Restrictions Weight Bearing Restrictions: No     Mobility  Bed Mobility Overal bed mobility: Modified Independent             General bed mobility comments: from [facility] flat, increased time needed and vc's for back precautions to  help manage discomfort. No physical assist    Transfers Overall transfer level: Independent Equipment used: None                    Ambulation/Gait Ambulation/Gait assistance: Independent Gait Distance (Feet): 50 Feet Assistive device: None Gait Pattern/deviations: Step-through pattern, Decreased stride length, Wide base of support Gait velocity: Decreased Gait velocity interpretation: <1.8 ft/sec, indicate of risk for recurrent falls   General Gait Details: pt continues to refuse ambulation in the hallway so remained in room but educated pt on how to work on increasing ambulation tolerance, setting measurable goals, and increasing distances in small increments.   Stairs             Wheelchair Mobility    Modified Rankin (Stroke Patients Only)       Balance Overall balance assessment: Needs assistance Sitting-balance support: No upper extremity supported, Feet supported Sitting balance-Leahy Scale: Good     Standing balance support: No upper extremity supported, During functional activity Standing balance-Leahy Scale: Good Standing balance comment: limitations evident but pt can perform motion within BOS without LOB                            Cognition Arousal/Alertness: Awake/alert Behavior During Therapy: WFL for tasks assessed/performed Overall Cognitive Status: Within Functional Limits for tasks assessed  Exercises      General Comments General comments (skin integrity, edema, etc.): HR 120's with ambulation, SPO2 in 90's on RA      Pertinent Vitals/Pain Pain Assessment Pain Assessment: Faces Faces Pain Scale: Hurts little more Pain Location: back Pain Descriptors / Indicators: Discomfort, Grimacing, Guarding Pain Intervention(s): Limited activity within patient's tolerance, Monitored during session    Home Living                          Prior Function             PT Goals (current goals can now be found in the care plan section) Acute Rehab PT Goals Patient Stated Goal: get healthier PT Goal Formulation: With patient Time For Goal Achievement: 05/16/22 Potential to Achieve Goals: Good Progress towards PT goals: Progressing toward goals    Frequency    Min 3X/week      PT Plan Discharge plan needs to be updated    Co-evaluation              AM-PAC PT "6 Clicks" Mobility   Outcome Measure  Help needed turning from your back to your side while in a flat bed without using bedrails?: None Help needed moving from lying on your back to sitting on the side of a flat bed without using bedrails?: None Help needed moving to and from a bed to a chair (including a wheelchair)?: None Help needed standing up from a chair using your arms (e.g., wheelchair or bedside chair)?: None Help needed to walk in hospital room?: None Help needed climbing 3-5 steps with a railing? : A Little 6 Click Score: 23    End of Session Equipment Utilized During Treatment: Gait belt Activity Tolerance: Patient tolerated treatment well Patient left: in bed;with call bell/phone within reach Nurse Communication: Mobility status PT Visit Diagnosis: Other abnormalities of gait and mobility (R26.89);Pain Pain - part of body:  (back)     Time: 9826-4158 PT Time Calculation (min) (ACUTE ONLY): 21 min  Charges:  $Gait Training: 8-22 mins                     Leighton Roach, PT  Acute Rehab Services Secure chat preferred Office [place] 05/03/2022, 11:26 AM

## 2022-05-11 ENCOUNTER — Telehealth (INDEPENDENT_AMBULATORY_CARE_PROVIDER_SITE_OTHER): Payer: Medicaid Other | Admitting: Physician Assistant

## 2022-05-11 ENCOUNTER — Encounter (HOSPITAL_COMMUNITY): Payer: Self-pay | Admitting: Physician Assistant

## 2022-05-11 DIAGNOSIS — F410 Panic disorder [episodic paroxysmal anxiety] without agoraphobia: Secondary | ICD-10-CM

## 2022-05-11 DIAGNOSIS — F5105 Insomnia due to other mental disorder: Secondary | ICD-10-CM | POA: Diagnosis not present

## 2022-05-11 DIAGNOSIS — F411 Generalized anxiety disorder: Secondary | ICD-10-CM | POA: Diagnosis not present

## 2022-05-11 DIAGNOSIS — F99 Mental disorder, not otherwise specified: Secondary | ICD-10-CM

## 2022-05-11 DIAGNOSIS — F3130 Bipolar disorder, current episode depressed, mild or moderate severity, unspecified: Secondary | ICD-10-CM | POA: Diagnosis not present

## 2022-05-11 MED ORDER — TRAZODONE HCL 100 MG PO TABS: 100.0000 mg | ORAL_TABLET | Freq: Every evening | ORAL | 1 refills | Status: DC | PRN

## 2022-05-11 MED ORDER — LURASIDONE HCL 40 MG PO TABS: 40.0000 mg | ORAL_TABLET | Freq: Every day | ORAL | 0 refills | Status: DC

## 2022-05-11 MED ORDER — ESCITALOPRAM OXALATE 10 MG PO TABS: 10.0000 mg | ORAL_TABLET | Freq: Every day | ORAL | 2 refills | Status: DC

## 2022-05-11 MED ORDER — LURASIDONE HCL 20 MG PO TABS: ORAL_TABLET | ORAL | 0 refills | Status: DC

## 2022-05-11 NOTE — Progress Notes (Signed)
BH MD/PA/NP OP Progress Note  Virtual Visit via Video Note  I connected with Christian Guerra on 05/11/22 at  3:30 PM EST by a video enabled telemedicine application and verified that I am speaking with the correct person using two identifiers.  Location: Patient: Home Provider: Clinic   I discussed the limitations of evaluation and management by telemedicine and the availability of in person appointments. The patient expressed understanding and agreed to proceed.  Follow Up Instructions:  I discussed the assessment and treatment plan with the patient. The patient was provided an opportunity to ask questions and all were answered. The patient agreed with the plan and demonstrated an understanding of the instructions.   The patient was advised to call back or seek an in-person evaluation if the symptoms worsen or if the condition fails to improve as anticipated.  I provided 23 minutes of non-face-to-face time during this encounter.  [name], PA    05/11/2022 5:37 PM Christian Guerra  MRN:  [id number]  Chief Complaint:  Chief Complaint  Patient presents with   Follow-up   Medication Management   HPI:   Christian Guerra is a 62 year old, Caucasian male with a past psychiatric history significant for insomnia, generalized anxiety disorder, panic disorder, and bipolar I disorder who presents to [facility] via virtual video visit for follow-up and medication management.  Patient was last seen by this provider on 11/29/2021.  During his last encounter, patient was being managed on the following psychiatric medications:  Lurasidone 120 mg daily Sertraline 150 mg daily Trazodone 100 mg bedtime  Patient reports that he has recently received new insurance and states that he should be able to get his Latuda from the [facility].  In regards to his medications, patient states that he would like to restart from scratch because he  has been taking sertraline incorrectly and has not taken his sertraline for roughly a week.  Instead of taking sertraline at 150 mg daily, patient states that he was taking sertraline at 100 mg 3 times daily.  Patient continues to endorse depression and rates his depression at 10 out of 10 with 10 being most severe.  Patient endorses depressive symptoms every day.  Patient's depressive episodes are characterized by the following symptoms: bad/negative thoughts, decreased concentration, and low mood.  Patient reports that he has had a real bad couple of months stating that he has had many unfortunate events befall him.  He reports that he recently had 11 teeth removed while visiting the dentist.  Patient also reports that he has been sick since December 20th.  Patient also states that his father recently had a stroke.  Patient endorses anxiety and rates his anxiety at 10 out of 10.  Patient's main stressor is his current health.  A PHQ-9 screen was performed with the patient scoring a 16.  A GAD-7 screen was also performed with the patient scoring a 20.  Patient is alert and oriented x 4, calm, cooperative, and fully engaged in conversation during the encounter.  Patient endorses depressed mood, anxiety, and hopelessness.  Patient denies suicidal or homicidal ideations.  He further denies auditory or visual hallucinations and does not appear to be responding to internal/external stimuli.  Patient endorses good sleep and receives on average 7 to 8 hours of sleep each night.  Patient endorses good appetite and eats on average 3 meals per day.  Patient denies alcohol consumption.  Patient endorses tobacco use in  the form of dip.  Patient denies illicit drug use.  Visit Diagnosis:    ICD-10-CM   1. Insomnia due to other mental disorder  F51.05 traZODone (DESYREL) 100 MG tablet   F99     2. Generalized anxiety disorder  F41.1 escitalopram (LEXAPRO) 10 MG tablet    3. Bipolar I disorder, most recent episode  depressed (HCC)  F31.30 lurasidone (LATUDA) 20 MG TABS tablet    lurasidone (LATUDA) 40 MG TABS tablet    4. Panic disorder  F41.0 escitalopram (LEXAPRO) 10 MG tablet      Past Psychiatric History:  Panic disorder Generalized anxiety disorder Insomnia Bipolar disorder  Past Medical History:  Past Medical History:  Diagnosis Date   Anxiety    Bilateral swelling of feet and ankles    Bruised kidney 09/10/2016   Chronic lower back pain    Chronic pain    Daily headache    Diastolic dysfunction with chronic heart failure (HCC)    Fundic gland polyps of stomach, benign    GERD (gastroesophageal reflux disease)    Hepatitis C    "treated; got down to 0 load 6-8 years ago" (09/13/2016)   Hypertension    Memory difficulties    "in the last week or so" (09/13/2016)   Morbid obesity ([place])    Personal history of colonic adenoma 01/20/2013   Recurrent falls    "recently" (09/13/2016)   Sleep apnea    "wore mask at night before; couldn't tolerate it" (09/13/2016)   Wears glasses     Past Surgical History:  Procedure Laterality Date   ANKLE FRACTURE SURGERY Bilateral 2001   [place]   BIOPSY  11/11/2018   Procedure: BIOPSY;  Surgeon: Gatha Mayer, MD;  Location: [facility];  Service: Endoscopy;;   COLONOSCOPY     COLONOSCOPY WITH PROPOFOL N/A 11/11/2018   Procedure: COLONOSCOPY WITH PROPOFOL;  Surgeon: Gatha Mayer, MD;  Location: [facility];  Service: Endoscopy;  Laterality: N/A;   ESOPHAGOGASTRODUODENOSCOPY (EGD) WITH PROPOFOL N/A 11/11/2018   Procedure: ESOPHAGOGASTRODUODENOSCOPY (EGD) WITH PROPOFOL;  Surgeon: Gatha Mayer, MD;  Location: [facility];  Service: Endoscopy;  Laterality: N/A;   FOREARM SURGERY Left 2001   "chain saw injury"   POLYPECTOMY  11/11/2018   Procedure: POLYPECTOMY;  Surgeon: Gatha Mayer, MD;  Location: [facility];  Service: Endoscopy;;   TESTICLE TORSION REDUCTION  1980    Family Psychiatric History:  Patient denies a family  history of psychiatric illness   Family History:  Family History  Problem Relation Age of Onset   Hypertension Father        old age--27   Other Father        enlarged prostate   Colon cancer Neg Hx     Social History:  Social History   Socioeconomic History   Marital status: Single    Spouse name: Not on file   Number of children: 0   Years of education: Not on file   Highest education level: Not on file  Occupational History   Occupation: unemployeed  Tobacco Use   Smoking status: Never   Smokeless tobacco: Current    Types: Snuff  Vaping Use   Vaping Use: Never used  Substance and Sexual Activity   Alcohol use: Not Currently    Comment: 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, not now, and non-smoker   Social Determinants of Health   Financial Resource Strain: High Risk (08/30/2020)   Overall Financial Resource Strain (CARDIA)    Difficulty of Paying Living Expenses: Hard  Food Insecurity: Food Insecurity Present (08/30/2020)   Hunger Vital Sign    Worried About [place] in the Last Year: Sometimes true    Ran Out of Food in the Last Year: Sometimes true  Transportation Needs: No Transportation Needs (08/30/2020)   PRAPARE - Hydrologist (Medical): No    Lack of Transportation (Non-Medical): No  Physical Activity: Inactive (08/30/2020)   Exercise Vital Sign    Days of Exercise per Week: 0 days    Minutes of Exercise per Session: 0 min  Stress: Stress Concern Present (08/30/2020)   [place]    Feeling of Stress : Very much  Social Connections: Socially Isolated (08/30/2020)   Social Connection and Isolation Panel [NHANES]    Frequency of Communication with Friends and Family: More than three times a week    Frequency of Social Gatherings with Friends and Family: Never    Attends Religious Services: Never    Marine scientist or Organizations: No    Attends Archivist Meetings: Never    Marital Status: Never married    Allergies:  Allergies  Allergen Reactions   Codeine Itching    Metabolic Disorder Labs: Lab Results  Component Value Date   HGBA1C 5.5 03/03/2021   MPG 114 06/15/2016   MPG 108 09/23/2012   No results found for: "PROLACTIN" Lab Results  Component Value Date   CHOL 88 03/03/2021   TRIG 149 03/03/2021   HDL 16 (A) 03/03/2021   CHOLHDL 4.4 06/05/2018   VLDL 32 (H) 07/02/2016   LDLCALC 46 03/03/2021   LDLCALC 77 06/05/2018   Lab Results  Component Value Date   TSH 0.20 (A) 03/03/2021   TSH 1.970 06/05/2018    Therapeutic Level Labs: No results found for: "LITHIUM" No results found for: "VALPROATE" No results found for: "CBMZ"  Current Medications: Current Outpatient Medications  Medication Sig Dispense Refill   escitalopram (LEXAPRO) 10 MG tablet Take 1 tablet (10 mg total) by mouth daily. 30 tablet 2   lurasidone (LATUDA) 20 MG TABS tablet Take 1 tablet (20 mg total) by mouth daily with breakfast for 4 days, THEN 2 tablets (40 mg total) daily with breakfast. 56 tablet 0   [START ON 06/09/2022] lurasidone (LATUDA) 40 MG TABS tablet Take 1 tablet (40 mg total) by mouth daily with breakfast. 30 tablet 0   albuterol (VENTOLIN HFA) 108 (90 Base) MCG/ACT inhaler Inhale 1 puff into the lungs every 6 (six) hours as needed for wheezing or shortness of breath. 1 Inhaler 0   atenolol (TENORMIN) 50 MG tablet Take 1 tablet (50 mg total) by mouth daily. 30 tablet 2   cyclobenzaprine (FLEXERIL) 10 MG  tablet Take 10 mg by mouth 3 (three) times daily as needed for muscle spasms.     diphenhydrAMINE (BENADRYL) 25 MG tablet Take 50 mg by mouth every 8 (eight) hours as needed for  itching or sleep.     finasteride (PROSCAR) 5 MG tablet Take 5 mg by mouth daily. (Patient not taking: Reported on 04/30/2022)     hydrochlorothiazide (HYDRODIURIL) 25 MG tablet Take 1 tablet (25 mg total) by mouth daily.     minocycline (MINOCIN) 100 MG capsule Take 100 mg by mouth 3 (three) times daily.     oxyCODONE (ROXICODONE) 15 MG immediate release tablet Take 15 mg by mouth every 8 (eight) hours.     pantoprazole (PROTONIX) 40 MG tablet Take 1 tablet (40 mg total) by mouth 2 (two) times daily. 60 tablet 1   tamsulosin (FLOMAX) 0.4 MG CAPS capsule Take 1 capsule (0.4 mg total) by mouth daily at 2 PM. 30 capsule 6   traZODone (DESYREL) 100 MG tablet Take 1 tablet (100 mg total) by mouth at bedtime as needed for sleep. 30 tablet 1   No current facility-administered medications for this visit.     Musculoskeletal: Strength & Muscle Tone: Unable to assess due to telemedicine visit [place]: Unable to assess due to telemedicine visit Patient leans: Unable to assess due to telemedicine visit  Psychiatric Specialty Exam: Review of Systems  Psychiatric/Behavioral:  Negative for decreased concentration, dysphoric mood, hallucinations, self-injury, sleep disturbance and suicidal ideas. The patient is nervous/anxious. The patient is not hyperactive.     There were no vitals taken for this visit.There is no height or weight on file to calculate BMI.  General Appearance: Fairly Groomed  Eye Contact:  Good  Speech:  Clear and Coherent and Normal Rate  Volume:  Normal  Mood:  Anxious and Depressed  Affect:  Congruent  Thought Process:  Coherent, Goal Directed, and Descriptions of Associations: Intact  Orientation:  Full (Time, Place, and Person)  Thought Content: WDL   Suicidal Thoughts:  No  Homicidal Thoughts:   No  Memory:  Immediate;   Good Recent;   Good Remote;   Fair  Judgement:  Good  Insight:  Fair  Psychomotor Activity:  Normal  Concentration:  Concentration: Good and Attention Span: Good  Recall:  Good  Fund of Knowledge: Fair  Language: Good  Akathisia:  No  Handed:  Right  AIMS (if indicated): not done  Assets:  Communication Skills Desire for Improvement Housing Social Support  ADL's:  Impaired  Cognition: WNL  Sleep:  Good   Screenings: GAD-7    Flowsheet Row Video Visit from 05/11/2022 in [facility] Video Visit from 11/29/2021 in [facility] Video Visit from 09/21/2021 in [facility] Video Visit from 07/18/2021 in [facility] Video Visit from 06/06/2021 in [facility]  Total GAD-7 Score '20 16 10 13 16      '$ PHQ2-9    Flowsheet Row Video Visit from 05/11/2022 in [facility] Video Visit from 11/29/2021 in [facility] Video Visit from 09/21/2021 in [facility] Video Visit from 07/18/2021 in [facility] Video Visit from 06/06/2021 in [facility]  PHQ-2 Total Score '6 5 2 2 5  '$ PHQ-9 Total Score '16 17 15 8 18      '$ Flowsheet Row Video Visit from 05/11/2022 in [facility] ED to Hosp-Admission (Discharged) from 04/30/2022 in [place] Video Visit from 11/29/2021 in [place] No Risk Low  Risk        Assessment and Plan:   Christian Guerra is a 62 year old, Caucasian male with a past psychiatric history significant for insomnia, generalized anxiety disorder, panic disorder, and bipolar I disorder who presents to [facility] via virtual video  visit for follow-up and medication management.  Patient was last seen by this provider on 11/29/2021.  Patient continues to endorse depression and anxiety stating that he has had a number of stressful events happen to him since the beginning of the year.  In regards to his medications, patient would like to restart from scratch due to taking his medications incorrectly.  Patient to start Latuda 20 mg daily for 4 days followed by 40 mg daily for the management of his bipolar disorder.  Patient was recommended Lexapro 10 mg daily for the management of his anxiety and panic attacks.  Lastly, patient to continue taking trazodone 100 mg at bedtime for the management of his sleep.  Patient was agreeable to recommendations.  Patient's medications to be e-prescribed to pharmacy of choice.  Collaboration of Care: Collaboration of Care: Medication Management AEB provider managing patient's psychiatric medications and Psychiatrist AEB patient being followed by mental health provider  Patient/Guardian was advised Release of Information must be obtained prior to any record release in order to collaborate their care with an outside provider. Patient/Guardian was advised if they have not already done so to contact the registration department to sign all necessary forms in order for [place] to release information regarding their care.   Consent: Patient/Guardian gives verbal consent for treatment and assignment of benefits for services provided during this visit. Patient/Guardian expressed understanding and agreed to proceed.   1. Insomnia due to other mental disorder  - traZODone (DESYREL) 100 MG tablet; Take 1 tablet (100 mg total) by mouth at bedtime as needed for sleep.  Dispense: 30 tablet; Refill: 1  2. Generalized anxiety disorder  - escitalopram (LEXAPRO) 10 MG tablet; Take 1 tablet (10 mg total) by mouth daily.  Dispense: 30 tablet; Refill: 2  3. Bipolar I disorder, most recent episode depressed (HCC)  -  lurasidone (LATUDA) 20 MG TABS tablet; Take 1 tablet (20 mg total) by mouth daily with breakfast for 4 days, THEN 2 tablets (40 mg total) daily with breakfast.  Dispense: 56 tablet; Refill: 0 - lurasidone (LATUDA) 40 MG TABS tablet; Take 1 tablet (40 mg total) by mouth daily with breakfast.  Dispense: 30 tablet; Refill: 0  4. Panic disorder  - escitalopram (LEXAPRO) 10 MG tablet; Take 1 tablet (10 mg total) by mouth daily.  Dispense: 30 tablet; Refill: 2  Patient to follow-up in 6 weeks Provider spent a total of 23 minutes with the patient/reviewing patient's chart  [name], PA 05/11/2022, 5:37 PM

## 2022-06-21 ENCOUNTER — Telehealth: Payer: Self-pay | Admitting: Orthopaedic Surgery

## 2022-06-21 ENCOUNTER — Encounter (HOSPITAL_COMMUNITY): Payer: Self-pay

## 2022-06-21 ENCOUNTER — Telehealth (HOSPITAL_COMMUNITY): Payer: Medicaid Other | Admitting: Physician Assistant

## 2022-06-21 NOTE — Telephone Encounter (Signed)
Called pt 1X and left vm for pt to call and reschedule per Dr Lorin Mercy

## 2022-06-26 ENCOUNTER — Telehealth (INDEPENDENT_AMBULATORY_CARE_PROVIDER_SITE_OTHER): Payer: Medicaid Other | Admitting: Physician Assistant

## 2022-06-26 ENCOUNTER — Encounter (HOSPITAL_COMMUNITY): Payer: Self-pay | Admitting: Physician Assistant

## 2022-06-26 DIAGNOSIS — F5105 Insomnia due to other mental disorder: Secondary | ICD-10-CM

## 2022-06-26 DIAGNOSIS — F99 Mental disorder, not otherwise specified: Secondary | ICD-10-CM

## 2022-06-26 DIAGNOSIS — F3130 Bipolar disorder, current episode depressed, mild or moderate severity, unspecified: Secondary | ICD-10-CM | POA: Diagnosis not present

## 2022-06-26 DIAGNOSIS — F410 Panic disorder [episodic paroxysmal anxiety] without agoraphobia: Secondary | ICD-10-CM | POA: Diagnosis not present

## 2022-06-26 DIAGNOSIS — F411 Generalized anxiety disorder: Secondary | ICD-10-CM | POA: Diagnosis not present

## 2022-06-26 MED ORDER — ESCITALOPRAM OXALATE 10 MG PO TABS: 10.0000 mg | ORAL_TABLET | Freq: Every day | ORAL | 1 refills | Status: DC

## 2022-06-26 MED ORDER — TRAZODONE HCL 100 MG PO TABS: 100.0000 mg | ORAL_TABLET | Freq: Every evening | ORAL | 1 refills | Status: DC | PRN

## 2022-06-26 MED ORDER — LURASIDONE HCL 60 MG PO TABS: 60.0000 mg | ORAL_TABLET | Freq: Every day | ORAL | 1 refills | Status: DC

## 2022-06-26 NOTE — Progress Notes (Signed)
BH MD/PA/NP OP Progress Note  Virtual Visit via Video Note  I connected with Christian Guerra on 06/26/22 at 10:00 AM EST by a video enabled telemedicine application and verified that I am speaking with the correct person using two identifiers.  Location: Patient: Home Provider: Clinic   I discussed the limitations of evaluation and management by telemedicine and the availability of in person appointments. The patient expressed understanding and agreed to proceed.  Follow Up Instructions:  I discussed the assessment and treatment plan with the patient. The patient was provided an opportunity to ask questions and all were answered. The patient agreed with the plan and demonstrated an understanding of the instructions.   The patient was advised to call back or seek an in-person evaluation if the symptoms worsen or if the condition fails to improve as anticipated.  I provided 23 minutes of non-face-to-face time during this encounter.  [name], PA    06/26/2022 3:13 PM Christian Guerra  MRN:  [id number]  Chief Complaint:  Chief Complaint  Patient presents with   Follow-up   Medication Management   HPI:   Christian Guerra is a 62 year old, Caucasian male with a past psychiatric history significant for insomnia, generalized anxiety disorder, panic disorder, and bipolar I disorder who presents to [facility] via virtual video visit for follow-up and medication management.  Patient is currently being managed on the following psychiatric medications:  Lurasidone 40 mg daily Escitalopram 10 mg daily Trazodone 100 mg bedtime  Patient reports that he has been doing better in terms of his mood and continues to take his medications as prescribed.  Patient endorses stressors related to his current physical health.  He also reports that he has been having trouble with his insurance approving the use of medications prescribed to him by his  multiple providers.  Although patient endorses improvements in his mood he continues to endorse depression that is always present.  He states that with everything he has going on in regards to his physical health, he does not have enough time to be depressed.  Patient rates his depression at 2 out of 10 with 10 being most severe.  Patient also endorses anxiety and rates his anxiety as 5 out of 10.  Patient states that there dramatic changes in his life with one of the changes being how his father recently had a stroke.  He also reports that everything is upside down at his house and that he is slowly working things out. Patient notes that he is often out of breath easily and recently had a swab collected from his nose and throat to determine a problem.  A PHQ-9 screen was performed with the patient scoring of 13.  A GAD-7 screen was also performed the patient scoring an 11.  Patient is alert and oriented x 4, calm, cooperative, and fully engaged in conversation during the encounter.  Patient describes his mood as anxious and worried.  Patient's worrying is attributed to his upcoming medical appointment.  Patient denies suicidal or homicidal ideations.  He further denies auditory or visual hallucinations and does not appear to be responding to internal plus external stimuli.  Patient reports that he has been receiving poor sleep lately.  He reports that he has received a total of 4 hours in the last 3 days due to his back pain.  Whenever his back is not bothering him, patient states that he receives on average 6 to 8 hours of  sleep through the use of his trazodone.  Patient endorses fair appetite and eats on average 1-2 meals per day.  Patient denies alcohol consumption.  Patient endorses tobacco use in the form of snuff.  Patient denies illicit drug use.  Visit Diagnosis:    ICD-10-CM   1. Bipolar I disorder, most recent episode depressed (HCC)  F31.30 lurasidone 60 MG TABS    2. Insomnia due to other mental  disorder  F51.05 traZODone (DESYREL) 100 MG tablet   F99     3. Generalized anxiety disorder  F41.1 escitalopram (LEXAPRO) 10 MG tablet    4. Panic disorder  F41.0 escitalopram (LEXAPRO) 10 MG tablet      Past Psychiatric History:  Panic disorder Generalized anxiety disorder Insomnia Bipolar disorder  Past Medical History:  Past Medical History:  Diagnosis Date   Anxiety    Bilateral swelling of feet and ankles    Bruised kidney 09/10/2016   Chronic lower back pain    Chronic pain    Daily headache    Diastolic dysfunction with chronic heart failure (HCC)    Fundic gland polyps of stomach, benign    GERD (gastroesophageal reflux disease)    Hepatitis C    "treated; got down to 0 load 6-8 years ago" (09/13/2016)   Hypertension    Memory difficulties    "in the last week or so" (09/13/2016)   Morbid obesity ([place])    Personal history of colonic adenoma 01/20/2013   Recurrent falls    "recently" (09/13/2016)   Sleep apnea    "wore mask at night before; couldn't tolerate it" (09/13/2016)   Wears glasses     Past Surgical History:  Procedure Laterality Date   ANKLE FRACTURE SURGERY Bilateral 2001   [place]   BIOPSY  11/11/2018   Procedure: BIOPSY;  Surgeon: Gatha Mayer, MD;  Location: [facility];  Service: Endoscopy;;   COLONOSCOPY     COLONOSCOPY WITH PROPOFOL N/A 11/11/2018   Procedure: COLONOSCOPY WITH PROPOFOL;  Surgeon: Gatha Mayer, MD;  Location: [facility];  Service: Endoscopy;  Laterality: N/A;   ESOPHAGOGASTRODUODENOSCOPY (EGD) WITH PROPOFOL N/A 11/11/2018   Procedure: ESOPHAGOGASTRODUODENOSCOPY (EGD) WITH PROPOFOL;  Surgeon: Gatha Mayer, MD;  Location: [facility];  Service: Endoscopy;  Laterality: N/A;   FOREARM SURGERY Left 2001   "chain saw injury"   POLYPECTOMY  11/11/2018   Procedure: POLYPECTOMY;  Surgeon: Gatha Mayer, MD;  Location: [facility];  Service: Endoscopy;;   TESTICLE TORSION REDUCTION  1980    Family Psychiatric  History:  Patient denies a family history of psychiatric illness   Family History:  Family History  Problem Relation Age of Onset   Hypertension Father        old age--94   Other Father        enlarged prostate   Colon cancer Neg Hx     Social History:  Social History   Socioeconomic History   Marital status: Single    Spouse name: Not on file   Number of children: 0   Years of education: Not on file   Highest education level: Not on file  Occupational History   Occupation: unemployeed  Tobacco Use   Smoking status: Never   Smokeless tobacco: Current    Types: Snuff  Vaping Use   Vaping Use: Never used  Substance and Sexual Activity   Alcohol use: Not Currently    Comment: 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, not now, and non-smoker   Social Determinants of Health   Financial Resource Strain: High Risk (08/30/2020)   Overall Financial Resource Strain (CARDIA)    Difficulty of Paying Living Expenses: Hard  Food Insecurity: Food Insecurity Present (08/30/2020)   Hunger Vital Sign    Worried About [place] in the Last Year: Sometimes true    Ran Out of Food in the Last Year: Sometimes true  Transportation Needs: No Transportation Needs (08/30/2020)   PRAPARE - Hydrologist (Medical): No    Lack of Transportation (Non-Medical): No  Physical Activity: Inactive (08/30/2020)   Exercise Vital Sign    Days of Exercise per Week: 0 days    Minutes of Exercise per Session: 0 min  Stress: Stress Concern Present (08/30/2020)   Christian Guerra    Feeling of Stress : Very much  Social Connections: Socially Isolated (08/30/2020)   Social Connection and Isolation Panel [NHANES]    Frequency of Communication with Friends and Family: More than three times a week    Frequency of Social Gatherings with Friends and Family: Never    Attends Religious Services: Never    Marine scientist or Organizations: No    Attends Archivist Meetings: Never    Marital Status: Never married    Allergies:  Allergies  Allergen Reactions   Codeine Itching    Metabolic Disorder Labs: Lab Results  Component Value Date   HGBA1C 5.5 03/03/2021   MPG 114 06/15/2016   MPG 108 09/23/2012   No results found for: "PROLACTIN" Lab Results  Component Value Date   CHOL 88 03/03/2021   TRIG 149 03/03/2021   HDL 16 (A) 03/03/2021   CHOLHDL 4.4 06/05/2018   VLDL 32 (H) 07/02/2016   LDLCALC 46 03/03/2021   LDLCALC 77 06/05/2018   Lab Results  Component Value Date   TSH 0.20 (A) 03/03/2021   TSH 1.970 06/05/2018    Therapeutic Level Labs: No results found for: "LITHIUM" No results found for: "VALPROATE" No results found for: "CBMZ"  Current Medications: Current Outpatient Medications  Medication Sig Dispense Refill   albuterol (VENTOLIN HFA) 108 (90 Base) MCG/ACT inhaler Inhale 1 puff into the lungs every 6 (six) hours as needed for wheezing or shortness of breath. 1 Inhaler 0   atenolol (TENORMIN) 50 MG tablet Take 1 tablet (50 mg total) by mouth daily. 30 tablet 2   cyclobenzaprine (FLEXERIL) 10 MG tablet Take 10 mg by mouth 3 (three) times daily as needed for muscle spasms.     diphenhydrAMINE (BENADRYL) 25 MG tablet Take 50 mg by mouth every 8 (eight) hours as needed for itching or sleep.     escitalopram (LEXAPRO) 10 MG tablet Take 1 tablet (10 mg total) by mouth daily. 30 tablet 1   finasteride (PROSCAR) 5 MG tablet Take 5 mg by mouth daily. (Patient not  taking: Reported on 04/30/2022)     hydrochlorothiazide (HYDRODIURIL) 25 MG tablet Take 1 tablet (25 mg total)  by mouth daily.     lurasidone 60 MG TABS Take 1 tablet (60 mg total) by mouth daily with breakfast. 30 tablet 1   minocycline (MINOCIN) 100 MG capsule Take 100 mg by mouth 3 (three) times daily.     oxyCODONE (ROXICODONE) 15 MG immediate release tablet Take 15 mg by mouth every 8 (eight) hours.     pantoprazole (PROTONIX) 40 MG tablet Take 1 tablet (40 mg total) by mouth 2 (two) times daily. 60 tablet 1   tamsulosin (FLOMAX) 0.4 MG CAPS capsule Take 1 capsule (0.4 mg total) by mouth daily at 2 PM. 30 capsule 6   traZODone (DESYREL) 100 MG tablet Take 1 tablet (100 mg total) by mouth at bedtime as needed for sleep. 30 tablet 1   No current facility-administered medications for this visit.     Musculoskeletal: Strength & Muscle Tone: Unable to assess due to telemedicine visit [place]: Unable to assess due to telemedicine visit Patient leans: Unable to assess due to telemedicine visit  Psychiatric Specialty Exam: Review of Systems  Psychiatric/Behavioral:  Negative for decreased concentration, dysphoric mood, hallucinations, self-injury, sleep disturbance and suicidal ideas. The patient is nervous/anxious. The patient is not hyperactive.     There were no vitals taken for this visit.There is no height or weight on file to calculate BMI.  General Appearance: Fairly Groomed  Eye Contact:  Good  Speech:  Clear and Coherent and Normal Rate  Volume:  Normal  Mood:  Anxious and Depressed  Affect:  Congruent  Thought Process:  Coherent, Goal Directed, and Descriptions of Associations: Intact  Orientation:  Full (Time, Place, and Person)  Thought Content: WDL   Suicidal Thoughts:  No  Homicidal Thoughts:  No  Memory:  Immediate;   Good Recent;   Good Remote;   Fair  Judgement:  Good  Insight:  Fair  Psychomotor Activity:  Normal  Concentration:  Concentration: Good and  Attention Span: Good  Recall:  Good  Fund of Knowledge: Fair  Language: Good  Akathisia:  No  Handed:  Right  AIMS (if indicated): not done  Assets:  Communication Skills Desire for Improvement Housing Social Support  ADL's:  Impaired  Cognition: WNL  Sleep:  Fair   Screenings: GAD-7    Flowsheet Row Video Visit from 06/26/2022 in [facility] Video Visit from 05/11/2022 in [facility] Video Visit from 11/29/2021 in [facility] Video Visit from 09/21/2021 in [facility] Video Visit from 07/18/2021 in [facility]  Total GAD-7 Score '11 20 16 10 13      '$ PHQ2-9    Flowsheet Row Video Visit from 06/26/2022 in [facility] Video Visit from 05/11/2022 in [facility] Video Visit from 11/29/2021 in [facility] Video Visit from 09/21/2021 in [facility] Video Visit from 07/18/2021 in [facility]  PHQ-2 Total Score '3 6 5 2 2  '$ PHQ-9 Total Score '13 16 17 15 8      '$ Flowsheet Row Video Visit from 06/26/2022 in [facility] Video Visit from 05/11/2022 in [facility] ED to Hosp-Admission (Discharged) from 04/30/2022 in [place] Low Risk Low Risk No Risk        Assessment and Plan:   Christian Guerra is a 62 year old,  Caucasian male with a past psychiatric history significant for insomnia, generalized anxiety disorder, panic disorder, and bipolar I disorder who presents to [facility] via virtual video visit for follow-up and medication management.  Patient reports that his mood has improved some since taking his medications as prescribed.  Patient does endorse some fluctuations  in mood and anxiety attributed to stressors in his life including his health and the health of the father.  Provider recommended increasing patient's Latuda from 40 mg to 60 mg daily for the management of his depressive symptoms and for mood stability.  Patient to take all other medications as prescribed.  Patient was agreeable to recommendation.  Patient's medications to be e-prescribed to pharmacy of choice.  Collaboration of Care: Collaboration of Care: Medication Management AEB provider managing patient's psychiatric medications and Psychiatrist AEB patient being followed by mental health provider  Patient/Guardian was advised Release of Information must be obtained prior to any record release in order to collaborate their care with an outside provider. Patient/Guardian was advised if they have not already done so to contact the registration department to sign all necessary forms in order for [place] to release information regarding their care.   Consent: Patient/Guardian gives verbal consent for treatment and assignment of benefits for services provided during this visit. Patient/Guardian expressed understanding and agreed to proceed.   1. Bipolar I disorder, most recent episode depressed (HCC)  - lurasidone 60 MG TABS; Take 1 tablet (60 mg total) by mouth daily with breakfast.  Dispense: 30 tablet; Refill: 1  2. Insomnia due to other mental disorder  - traZODone (DESYREL) 100 MG tablet; Take 1 tablet (100 mg total) by mouth at bedtime as needed for sleep.  Dispense: 30 tablet; Refill: 1  3. Generalized anxiety disorder  - escitalopram (LEXAPRO) 10 MG tablet; Take 1 tablet (10 mg total) by mouth daily.  Dispense: 30 tablet; Refill: 1  4. Panic disorder  - escitalopram (LEXAPRO) 10 MG tablet; Take 1 tablet (10 mg total) by mouth daily.  Dispense: 30 tablet; Refill: 1  Patient to follow-up in 6 weeks Provider spent a total of 23 minutes with the patient/reviewing patient's chart  [name], PA 06/26/2022, 3:13 PM

## 2022-08-29 ENCOUNTER — Telehealth (INDEPENDENT_AMBULATORY_CARE_PROVIDER_SITE_OTHER): Payer: Medicaid Other | Admitting: Physician Assistant

## 2022-08-29 DIAGNOSIS — F410 Panic disorder [episodic paroxysmal anxiety] without agoraphobia: Secondary | ICD-10-CM

## 2022-08-29 DIAGNOSIS — F5105 Insomnia due to other mental disorder: Secondary | ICD-10-CM

## 2022-08-29 DIAGNOSIS — F3130 Bipolar disorder, current episode depressed, mild or moderate severity, unspecified: Secondary | ICD-10-CM

## 2022-08-29 DIAGNOSIS — F411 Generalized anxiety disorder: Secondary | ICD-10-CM | POA: Diagnosis not present

## 2022-08-29 MED ORDER — LURASIDONE HCL 80 MG PO TABS
80.0000 mg | ORAL_TABLET | Freq: Every day | ORAL | 2 refills | Status: DC
Start: 2022-08-29 — End: 2022-08-29

## 2022-08-29 MED ORDER — ESCITALOPRAM OXALATE 20 MG PO TABS
20.0000 mg | ORAL_TABLET | Freq: Every day | ORAL | 2 refills | Status: DC
Start: 2022-08-29 — End: 2022-10-31

## 2022-08-29 MED ORDER — LURASIDONE HCL 80 MG PO TABS
80.0000 mg | ORAL_TABLET | Freq: Every day | ORAL | 2 refills | Status: DC
Start: 2022-08-29 — End: 2022-10-31

## 2022-08-29 MED ORDER — TRAZODONE HCL 100 MG PO TABS: 100.0000 mg | ORAL_TABLET | Freq: Every evening | ORAL | 2 refills | Status: DC | PRN

## 2022-08-30 ENCOUNTER — Encounter (HOSPITAL_COMMUNITY): Payer: Self-pay | Admitting: Physician Assistant

## 2022-08-30 NOTE — Progress Notes (Signed)
BH MD/PA/NP OP Progress Note  Virtual Visit via Video Note  I connected with Christian Guerra on 08/30/22 at  2:30 PM EDT by a video enabled telemedicine application and verified that I am speaking with the correct person using two identifiers.  Location: Patient: Home Provider: Clinic   I discussed the limitations of evaluation and management by telemedicine and the availability of in person appointments. The patient expressed understanding and agreed to proceed.  Follow Up Instructions:  I discussed the assessment and treatment plan with the patient. The patient was provided an opportunity to ask questions and all were answered. The patient agreed with the plan and demonstrated an understanding of the instructions.   The patient was advised to call back or seek an in-person evaluation if the symptoms worsen or if the condition fails to improve as anticipated.  I provided 21 minutes of non-face-to-face time during this encounter.  Meta Hatchet, PA    08/30/2022 7:49 PM Mare Loan  MRN:  [id number]  Chief Complaint:  Chief Complaint  Patient presents with   Follow-up   Medication Management   HPI:   Christian Guerra is a 62 year old, Caucasian male with a past psychiatric history significant for insomnia, generalized anxiety disorder, panic disorder, and bipolar I disorder who presents to [facility] via virtual video visit for follow-up and medication management.  Patient is currently being managed on the following psychiatric medications:  Lurasidone 60 mg daily Escitalopram 10 mg daily Trazodone 100 mg bedtime  Patient presents to the encounter stating that his escitalopram and Latuda need to be increased.  He reports that he has been experiencing more low days than normal.  He also reports that he has been having issues with thinking about bad things and ruminating about the past.  He acknowledges that things are not as  bad as they could be but states that things are still not great.  He describes being consumed with negativity.  Patient endorses depression and rates his depression as 5 or 6 out of 10.  Patient endorses depressive episodes almost every day with symptoms coming and going throughout the day.  Patient endorses the following depressive symptoms: feelings of sadness, decreased concentration, irritability, feelings of guilt/worthlessness, and hopelessness.  Patient denies decreased motivation or crying spells.  Patient states that his anxiety is not as bad but is still present.  Patient rates his anxiety a 5 out of 10.  Patient's stressors include getting his health straightened out and thinking about death a lot.  A PHQ-9 screen was performed with the patient scoring a 16.  A GAD-7 screen was also performed with the patient scoring an 18.  Patient is alert and oriented x 4, calm, cooperative, and fully engaged in conversation during the encounter.  Patient describes his mood as upbeat and optimistic.  Patient denies suicidal or homicidal ideations.  He further denies auditory or visual hallucinations and does not appear to be responding to internal/external stimuli.  Patient endorses intermittent sleep and receives on average 8 to 9 hours of sleep per night.  Patient endorses good appetite and eats on average 3 meals per day.  Patient denies alcohol consumption or illicit drug use.  Patient endorses tobacco use in the form of dipping.  Visit Diagnosis:    ICD-10-CM   1. Bipolar I disorder, most recent episode depressed (HCC)  F31.30 lurasidone (LATUDA) 80 MG TABS tablet    DISCONTINUED: lurasidone (LATUDA) 80 MG TABS tablet  DISCONTINUED: lurasidone (LATUDA) 80 MG TABS tablet    2. Generalized anxiety disorder  F41.1 escitalopram (LEXAPRO) 20 MG tablet    3. Panic disorder  F41.0 escitalopram (LEXAPRO) 20 MG tablet    4. Insomnia due to other mental disorder  F51.05 traZODone (DESYREL) 100 MG tablet    F99       Past Psychiatric History:  Panic disorder Generalized anxiety disorder Insomnia Bipolar disorder  Past Medical History:  Past Medical History:  Diagnosis Date   Anxiety    Bilateral swelling of feet and ankles    Bruised kidney 09/10/2016   Chronic lower back pain    Chronic pain    Daily headache    Diastolic dysfunction with chronic heart failure (HCC)    Fundic gland polyps of stomach, benign    GERD (gastroesophageal reflux disease)    Hepatitis C    "treated; got down to 0 load 6-8 years ago" (09/13/2016)   Hypertension    Memory difficulties    "in the last week or so" (09/13/2016)   Morbid obesity (HCC)    Personal history of colonic adenoma 01/20/2013   Recurrent falls    "recently" (09/13/2016)   Sleep apnea    "wore mask at night before; couldn't tolerate it" (09/13/2016)   Wears glasses     Past Surgical History:  Procedure Laterality Date   ANKLE FRACTURE SURGERY Bilateral 2001   APPENDECTOMY  1975   BIOPSY  11/11/2018   Procedure: BIOPSY;  Surgeon: Iva Boop, MD;  Location: [facility];  Service: Endoscopy;;   COLONOSCOPY     COLONOSCOPY WITH PROPOFOL N/A 11/11/2018   Procedure: COLONOSCOPY WITH PROPOFOL;  Surgeon: Iva Boop, MD;  Location: [facility];  Service: Endoscopy;  Laterality: N/A;   ESOPHAGOGASTRODUODENOSCOPY (EGD) WITH PROPOFOL N/A 11/11/2018   Procedure: ESOPHAGOGASTRODUODENOSCOPY (EGD) WITH PROPOFOL;  Surgeon: Iva Boop, MD;  Location: [facility];  Service: Endoscopy;  Laterality: N/A;   FOREARM SURGERY Left 2001   "chain saw injury"   POLYPECTOMY  11/11/2018   Procedure: POLYPECTOMY;  Surgeon: Iva Boop, MD;  Location: [facility];  Service: Endoscopy;;   TESTICLE TORSION REDUCTION  1980    Family Psychiatric History:  Patient denies a family history of psychiatric illness   Family History:  Family History  Problem Relation Age of Onset   Hypertension Father        old age--31   Other Father         enlarged prostate   Colon cancer Neg Hx     Social History:  Social History   Socioeconomic History   Marital status: Single    Spouse name: Not on file   Number of children: 0   Years of education: Not on file   Highest education level: Not on file  Occupational History   Occupation: unemployeed  Tobacco Use   Smoking status: Never   Smokeless tobacco: Current    Types: Snuff  Vaping Use   Vaping Use: Never used  Substance and Sexual Activity   Alcohol use: Not Currently    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, not now, and non-smoker   Social Determinants of Health   Financial Resource Strain: High Risk (08/30/2020)   Overall Financial Resource Strain (CARDIA)    Difficulty of Paying Living Expenses: Hard  Food Insecurity: Food Insecurity Present (08/30/2020)   Hunger Vital Sign    Worried About Running Out of Food in the Last Year: Sometimes true    Ran Out of Food in the Last Year: Sometimes true  Transportation Needs: No Transportation Needs (08/30/2020)   PRAPARE - Administrator, Civil Service (Medical): No    Lack of Transportation (Non-Medical): No  Physical Activity: Inactive (08/30/2020)   Exercise Vital Sign    Days of Exercise per Week: 0 days    Minutes of Exercise per Session: 0 min  Stress: Stress Concern Present (08/30/2020)   [employer] of Occupational Health - Occupational Stress Questionnaire    Feeling of Stress : Very much  Social Connections: Socially Isolated (08/30/2020)   Social Connection and Isolation Panel [NHANES]     Frequency of Communication with Friends and Family: More than three times a week    Frequency of Social Gatherings with Friends and Family: Never    Attends Religious Services: Never    Database administrator or Organizations: No    Attends Banker Meetings: Never    Marital Status: Never married    Allergies:  Allergies  Allergen Reactions   Codeine Itching    Metabolic Disorder Labs: Lab Results  Component Value Date   HGBA1C 5.5 03/03/2021   MPG 114 06/15/2016   MPG 108 09/23/2012   No results found for: "PROLACTIN" Lab Results  Component Value Date   CHOL 88 03/03/2021   TRIG 149 03/03/2021   HDL 16 (A) 03/03/2021   CHOLHDL 4.4 06/05/2018   VLDL 32 (H) 07/02/2016   LDLCALC 46 03/03/2021   LDLCALC 77 06/05/2018   Lab Results  Component Value Date   TSH 0.20 (A) 03/03/2021   TSH 1.970 06/05/2018    Therapeutic Level Labs: No results found for: "LITHIUM" No results found for: "VALPROATE" No results found for: "CBMZ"  Current Medications: Current Outpatient Medications  Medication Sig Dispense Refill   albuterol (VENTOLIN HFA) 108 (90 Base) MCG/ACT inhaler Inhale 1 puff into the lungs every 6 (six) hours as needed for wheezing or shortness of breath. 1 Inhaler 0   atenolol (TENORMIN) 50 MG tablet Take 1 tablet (50 mg total) by mouth daily. 30 tablet 2   cyclobenzaprine (FLEXERIL) 10 MG tablet Take 10 mg by mouth 3 (three) times daily as needed for muscle spasms.     diphenhydrAMINE (BENADRYL) 25 MG tablet Take 50 mg by mouth every 8 (eight) hours as needed for itching or sleep.     escitalopram (LEXAPRO) 20 MG tablet Take 1 tablet (20 mg total) by mouth daily. 30 tablet 2   finasteride (PROSCAR) 5 MG tablet Take 5 mg by mouth daily. (Patient not taking: Reported on 04/30/2022)     hydrochlorothiazide (HYDRODIURIL) 25 MG tablet Take 1 tablet (25 mg total) by mouth daily.     lurasidone (LATUDA) 80 MG TABS tablet Take 1 tablet (80 mg total) by mouth  daily with breakfast. 30 tablet 2   minocycline (MINOCIN) 100 MG capsule Take 100 mg by mouth 3 (three) times daily.     oxyCODONE (ROXICODONE) 15 MG immediate release tablet Take 15  mg by mouth every 8 (eight) hours.     pantoprazole (PROTONIX) 40 MG tablet Take 1 tablet (40 mg total) by mouth 2 (two) times daily. 60 tablet 1   tamsulosin (FLOMAX) 0.4 MG CAPS capsule Take 1 capsule (0.4 mg total) by mouth daily at 2 PM. 30 capsule 6   traZODone (DESYREL) 100 MG tablet Take 1 tablet (100 mg total) by mouth at bedtime as needed for sleep. 30 tablet 2   No current facility-administered medications for this visit.     Musculoskeletal: Strength & Muscle Tone: Unable to assess due to telemedicine visit Gait & Station: Unable to assess due to telemedicine visit Patient leans: Unable to assess due to telemedicine visit  Psychiatric Specialty Exam: Review of Systems  Psychiatric/Behavioral:  Negative for decreased concentration, dysphoric mood, hallucinations, self-injury, sleep disturbance and suicidal ideas. The patient is nervous/anxious. The patient is not hyperactive.     There were no vitals taken for this visit.There is no height or weight on file to calculate BMI.  General Appearance: Fairly Groomed  Eye Contact:  Good  Speech:  Clear and Coherent and Normal Rate  Volume:  Normal  Mood:  Anxious and Depressed  Affect:  Congruent  Thought Process:  Coherent, Goal Directed, and Descriptions of Associations: Intact  Orientation:  Full (Time, Place, and Person)  Thought Content: WDL   Suicidal Thoughts:  No  Homicidal Thoughts:  No  Memory:  Immediate;   Good Recent;   Good Remote;   Fair  Judgement:  Good  Insight:  Fair  Psychomotor Activity:  Normal  Concentration:  Concentration: Good and Attention Span: Good  Recall:  Good  Fund of Knowledge: Fair  Language: Good  Akathisia:  No  Handed:  Right  AIMS (if indicated): not done  Assets:  Communication Skills Desire for  Improvement Housing Social Support  ADL's:  Impaired  Cognition: WNL  Sleep:  Fair   Screenings: GAD-7    Flowsheet Row Video Visit from 08/29/2022 in [facility] Video Visit from 06/26/2022 in [facility] Video Visit from 05/11/2022 in [facility] Video Visit from 11/29/2021 in [facility] Video Visit from 09/21/2021 in [facility]  Total GAD-7 Score 18 11 20 16 10       PHQ2-9    Flowsheet Row Video Visit from 08/29/2022 in [facility] Video Visit from 06/26/2022 in [facility] Video Visit from 05/11/2022 in [facility] Video Visit from 11/29/2021 in [facility] Video Visit from 09/21/2021 in [facility]  PHQ-2 Total Score 4 3 6 5 2   PHQ-9 Total Score 16 13 16 17 15       Flowsheet Row Video Visit from 08/29/2022 in [facility] Video Visit from [facility]/26/2022 in [facility] Video Visit from 05/11/2022 in [facility]  C-SSRS RISK CATEGORY No Risk Low Risk Low Risk        Assessment and Plan:   Cardon Trevorrow. Karpf is a 62 year old, Caucasian male with a past psychiatric history significant for insomnia, generalized anxiety disorder, panic disorder, and bipolar I disorder who presents to [facility] via virtual video visit for follow-up and medication management.  Patient continues to endorse ongoing depression and anxiety.  Patient is open to the idea of adjusting his escitalopram and  Latuda dosages.  Provider recommended increasing his Latuda from 60 mg to 80 mg daily for the management of his depressive symptoms and for mood stability.  Provider also recommended increasing his escitalopram from 10 mg to 20  mg daily for the management of his depressive symptoms and anxiety.  Patient was agreeable to recommendations.  Patient's medications to be e-prescribed to pharmacy of choice.  Collaboration of Care: Collaboration of Care: Medication Management AEB provider managing patient's psychiatric medications and Psychiatrist AEB patient being followed by mental health provider  Patient/Guardian was advised Release of Information must be obtained prior to any record release in order to collaborate their care with an outside provider. Patient/Guardian was advised if they have not already done so to contact the registration department to sign all necessary forms in order for [place] to release information regarding their care.   Consent: Patient/Guardian gives verbal consent for treatment and assignment of benefits for services provided during this visit. Patient/Guardian expressed understanding and agreed to proceed.   1. Bipolar I disorder, most recent episode depressed (HCC)  - lurasidone (LATUDA) 80 MG TABS tablet; Take 1 tablet (80 mg total) by mouth daily with breakfast.  Dispense: 30 tablet; Refill: 2  2. Generalized anxiety disorder  - escitalopram (LEXAPRO) 20 MG tablet; Take 1 tablet (20 mg total) by mouth daily.  Dispense: 30 tablet; Refill: 2  3. Panic disorder  - escitalopram (LEXAPRO) 20 MG tablet; Take 1 tablet (20 mg total) by mouth daily.  Dispense: 30 tablet; Refill: 2  4. Insomnia due to other mental disorder  - traZODone (DESYREL) 100 MG tablet; Take 1 tablet (100 mg total) by mouth at bedtime as needed for sleep.  Dispense: 30 tablet; Refill: 2  Patient to follow-up in 6 weeks Provider spent a total of 21 minutes with the patient/reviewing patient's chart  Meta Hatchet, PA 08/30/2022, 7:49 PM

## 2022-10-09 ENCOUNTER — Ambulatory Visit: Payer: Medicaid Other | Admitting: Orthopaedic Surgery

## 2022-10-10 ENCOUNTER — Ambulatory Visit: Payer: Medicaid Other | Admitting: Orthopaedic Surgery

## 2022-10-31 ENCOUNTER — Telehealth (HOSPITAL_COMMUNITY): Payer: Medicaid Other | Admitting: Physician Assistant

## 2022-10-31 DIAGNOSIS — F3130 Bipolar disorder, current episode depressed, mild or moderate severity, unspecified: Secondary | ICD-10-CM | POA: Diagnosis not present

## 2022-10-31 DIAGNOSIS — F411 Generalized anxiety disorder: Secondary | ICD-10-CM

## 2022-10-31 DIAGNOSIS — F410 Panic disorder [episodic paroxysmal anxiety] without agoraphobia: Secondary | ICD-10-CM | POA: Diagnosis not present

## 2022-10-31 DIAGNOSIS — F5105 Insomnia due to other mental disorder: Secondary | ICD-10-CM | POA: Diagnosis not present

## 2022-10-31 DIAGNOSIS — F99 Mental disorder, not otherwise specified: Secondary | ICD-10-CM

## 2022-10-31 MED ORDER — TRAZODONE HCL 150 MG PO TABS
150.0000 mg | ORAL_TABLET | Freq: Every evening | ORAL | 2 refills | Status: DC | PRN
Start: 2022-10-31 — End: 2023-01-02

## 2022-10-31 MED ORDER — ESCITALOPRAM OXALATE 20 MG PO TABS
20.0000 mg | ORAL_TABLET | Freq: Every day | ORAL | 2 refills | Status: DC
Start: 2022-10-31 — End: 2023-01-02

## 2022-10-31 MED ORDER — LURASIDONE HCL 80 MG PO TABS: 80.0000 mg | ORAL_TABLET | Freq: Every day | ORAL | 2 refills | Status: DC

## 2022-11-01 ENCOUNTER — Telehealth (HOSPITAL_COMMUNITY): Payer: Self-pay | Admitting: *Deleted

## 2022-11-01 ENCOUNTER — Encounter (HOSPITAL_COMMUNITY): Payer: Self-pay | Admitting: Physician Assistant

## 2022-11-01 NOTE — Progress Notes (Signed)
BH MD/PA/NP OP Progress Note  Virtual Visit via Video Note  I connected with Christian Guerra on 11/01/22 at  2:00 PM EDT by a video enabled telemedicine application and verified that I am speaking with the correct person using two identifiers.  Location: Patient: Home Provider: Clinic   I discussed the limitations of evaluation and management by telemedicine and the availability of in person appointments. The patient expressed understanding and agreed to proceed.  Follow Up Instructions:  I discussed the assessment and treatment plan with the patient. The patient was provided an opportunity to ask questions and all were answered. The patient agreed with the plan and demonstrated an understanding of the instructions.   The patient was advised to call back or seek an in-person evaluation if the symptoms worsen or if the condition fails to improve as anticipated.  I provided 14 minutes of non-face-to-face time during this encounter.  Meta Hatchet, PA    11/01/2022 8:51 PM Mare Loan  MRN:  [id number]  Chief Complaint:  Chief Complaint  Patient presents with   Follow-up   Medication Management   HPI:   Christian Chausse. Guerra is a 62 year old, Caucasian male with a past psychiatric history significant for insomnia, generalized anxiety disorder, panic disorder, and bipolar I disorder who presents to [facility] via virtual video visit for follow-up and medication management.  Patient is currently being managed on the following psychiatric medications:  Lurasidone 60 mg daily Escitalopram 10 mg daily Trazodone 100 mg bedtime  Patient presents to the encounter stating that he has been having a hard time sleeping and would like to increase his dosage of trazodone.  In regards to his mood, patient reports that he cried a couple of times last week, but attributed to his crying spell to a sad movie he had watched.  Patient reports that he has  been doing pretty well and endorses minimal depression.  Patient rates his depression at 1 out of 10 with 10 being most severe.  Patient continues to endorse anxiety but feels that it may never go away.  Patient's main stressor involves finding a new place to stay.  A PHQ-9 screen was performed with the patient scoring of 15.  A GAD-7 screen was also performed with the patient scoring a 12.  Patient is alert and oriented x 4, calm, cooperative, and fully engaged in conversation during the encounter.  Patient describes his mood as optimistic.  Patient denies suicidal or homicidal ideations.  He further denies auditory or visual hallucinations and does not appear to be responding to internal/external stimuli.  Patient endorses fair sleep and receives on average 4 to 6 hours of sleep per night.  Patient states there are times where he may go 2 days without adequate sleep and then pass out due to extreme fatigue.  Patient endorses good appetite and eats on average 2-3 meals per day.  Patient denies alcohol consumption and illicit drug use.  Patient endorses tobacco use in the form of snuff.  Visit Diagnosis:    ICD-10-CM   1. Bipolar I disorder, most recent episode depressed (HCC)  F31.30 lurasidone (LATUDA) 80 MG TABS tablet    2. Insomnia due to other mental disorder  F51.05 traZODone (DESYREL) 150 MG tablet   F99     3. Generalized anxiety disorder  F41.1 escitalopram (LEXAPRO) 20 MG tablet    4. Panic disorder  F41.0 escitalopram (LEXAPRO) 20 MG tablet      Past  Psychiatric History:  Panic disorder Generalized anxiety disorder Insomnia Bipolar disorder  Past Medical History:  Past Medical History:  Diagnosis Date   Anxiety    Bilateral swelling of feet and ankles    Bruised kidney 09/10/2016   Chronic lower back pain    Chronic pain    Daily headache    Diastolic dysfunction with chronic heart failure (HCC)    Fundic gland polyps of stomach, benign    GERD (gastroesophageal reflux  disease)    Hepatitis C    "treated; got down to 0 load 6-8 years ago" (09/13/2016)   Hypertension    Memory difficulties    "in the last week or so" (09/13/2016)   Morbid obesity (HCC)    Personal history of colonic adenoma 01/20/2013   Recurrent falls    "recently" (09/13/2016)   Sleep apnea    "wore mask at night before; couldn't tolerate it" (09/13/2016)   Wears glasses     Past Surgical History:  Procedure Laterality Date   ANKLE FRACTURE SURGERY Bilateral 2001   APPENDECTOMY  1975   BIOPSY  11/11/2018   Procedure: BIOPSY;  Surgeon: Iva Boop, MD;  Location: [facility];  Service: Endoscopy;;   COLONOSCOPY     COLONOSCOPY WITH PROPOFOL N/A 11/11/2018   Procedure: COLONOSCOPY WITH PROPOFOL;  Surgeon: Iva Boop, MD;  Location: [facility];  Service: Endoscopy;  Laterality: N/A;   ESOPHAGOGASTRODUODENOSCOPY (EGD) WITH PROPOFOL N/A 11/11/2018   Procedure: ESOPHAGOGASTRODUODENOSCOPY (EGD) WITH PROPOFOL;  Surgeon: Iva Boop, MD;  Location: [facility];  Service: Endoscopy;  Laterality: N/A;   FOREARM SURGERY Left 2001   "chain saw injury"   POLYPECTOMY  11/11/2018   Procedure: POLYPECTOMY;  Surgeon: Iva Boop, MD;  Location: [facility];  Service: Endoscopy;;   TESTICLE TORSION REDUCTION  1980    Family Psychiatric History:  Patient denies a family history of psychiatric illness   Family History:  Family History  Problem Relation Age of Onset   Hypertension Father        old age--58   Other Father        enlarged prostate   Colon cancer Neg Hx     Social History:  Social History   Socioeconomic History   Marital status: Single    Spouse name: Not on file   Number of children: 0   Years of education: Not on file   Highest education level: Not on file  Occupational History   Occupation: unemployeed  Tobacco Use   Smoking status: Never   Smokeless tobacco: Current    Types: Snuff  Vaping Use   Vaping status: Never Used  Substance and Sexual  Activity   Alcohol use: Not Currently    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, not now, and non-smoker   Social Determinants of Health   Financial Resource Strain: Not at Risk (07/09/2022)   Received from [employer]    Financial Resource Strain: 1  Food Insecurity: Not at Risk (07/09/2022)  Received from [employer]    Food: 1  Transportation Needs: Not at Risk (07/09/2022)   Received from [employer] [facility]    Transportation: 1  Physical Activity: Not on File (05/29/2022)   Received from [facility]   Physical Activity    Physical Activity: 0  Stress: Not on File (05/29/2022)   Received from [facility]   Stress    Stress: 0  Social Connections: Not on File (05/29/2022)   Received from [facility]   Social Connections    Social Connections and Isolation: 0    Allergies:  Allergies  Allergen Reactions   Codeine Itching    Metabolic Disorder Labs: Lab Results  Component Value Date   HGBA1C 5.5 03/03/2021   MPG 114 06/15/2016   MPG 108 09/23/2012   No results found for: "PROLACTIN" Lab Results  Component Value Date   CHOL 88 03/03/2021   TRIG 149 03/03/2021   HDL 16 (A) 03/03/2021   CHOLHDL 4.4 06/05/2018   VLDL 32 (H) 07/02/2016   LDLCALC 46 03/03/2021   LDLCALC 77 06/05/2018   Lab Results  Component Value Date   TSH 0.20 (A) 03/03/2021   TSH 1.970 06/05/2018    Therapeutic Level Labs: No results found for: "LITHIUM" No results found for: "VALPROATE" No results found for: "CBMZ"  Current Medications: Current  Outpatient Medications  Medication Sig Dispense Refill   albuterol (VENTOLIN HFA) 108 (90 Base) MCG/ACT inhaler Inhale 1 puff into the lungs every 6 (six) hours as needed for wheezing or shortness of breath. 1 Inhaler 0   atenolol (TENORMIN) 50 MG tablet Take 1 tablet (50 mg total) by mouth daily. 30 tablet 2   cyclobenzaprine (FLEXERIL) 10 MG tablet Take 10 mg by mouth 3 (three) times daily as needed for muscle spasms.     diphenhydrAMINE (BENADRYL) 25 MG tablet Take 50 mg by mouth every 8 (eight) hours as needed for itching or sleep.     escitalopram (LEXAPRO) 20 MG tablet Take 1 tablet (20 mg total) by mouth daily. 30 tablet 2   finasteride (PROSCAR) 5 MG tablet Take 5 mg by mouth daily. (Patient not taking: Reported on 04/30/2022)     hydrochlorothiazide (HYDRODIURIL) 25 MG tablet Take 1 tablet (25 mg total) by mouth daily.     lurasidone (LATUDA) 80 MG TABS tablet Take 1 tablet (80 mg total) by mouth daily with breakfast. 30 tablet 2   minocycline (MINOCIN) 100 MG capsule Take 100 mg by mouth 3 (three) times daily.     oxyCODONE (ROXICODONE) 15 MG immediate release tablet Take 15 mg by mouth every 8 (eight) hours.     pantoprazole (PROTONIX) 40 MG tablet Take 1 tablet (40 mg total) by mouth 2 (two) times daily. 60 tablet 1   tamsulosin (FLOMAX) 0.4 MG CAPS capsule Take 1 capsule (0.4 mg total) by mouth daily at 2 PM. 30 capsule 6   traZODone (DESYREL) 150 MG tablet Take 1 tablet (150 mg total) by mouth at bedtime as needed for sleep. 30 tablet 2   No current facility-administered medications for this visit.     Musculoskeletal: Strength & Muscle Tone: Unable to assess due to telemedicine visit Gait & Station: Unable to assess due to telemedicine visit Patient leans: Unable to assess due to telemedicine visit  Psychiatric Specialty Exam: Review of Systems  Psychiatric/Behavioral:  Negative for decreased concentration, dysphoric mood, hallucinations, self-injury, sleep disturbance and  suicidal ideas. The patient is nervous/anxious. The patient is not  hyperactive.     There were no vitals taken for this visit.There is no height or weight on file to calculate BMI.  General Appearance: Fairly Groomed  Eye Contact:  Good  Speech:  Clear and Coherent and Normal Rate  Volume:  Normal  Mood:  Anxious and Depressed  Affect:  Congruent  Thought Process:  Coherent, Goal Directed, and Descriptions of Associations: Intact  Orientation:  Full (Time, Place, and Person)  Thought Content: WDL   Suicidal Thoughts:  No  Homicidal Thoughts:  No  Memory:  Immediate;   Good Recent;   Good Remote;   Fair  Judgement:  Good  Insight:  Fair  Psychomotor Activity:  Normal  Concentration:  Concentration: Good and Attention Span: Good  Recall:  Good  Fund of Knowledge: Fair  Language: Good  Akathisia:  No  Handed:  Right  AIMS (if indicated): not done  Assets:  Communication Skills Desire for Improvement Housing Social Support  ADL's:  Impaired  Cognition: WNL  Sleep:  Fair   Screenings: GAD-7    Flowsheet Row Video Visit from 10/31/2022 in [facility] Video Visit from 08/29/2022 in [facility] Video Visit from 06/26/2022 in [facility] Video Visit from 05/11/2022 in [facility] Video Visit from 11/29/2021 in [facility]  Total GAD-7 Score 12 18 11 20 16       PHQ2-9    Flowsheet Row Video Visit from 10/31/2022 in [facility] Video Visit from 08/29/2022 in [facility] Video Visit from 06/26/2022 in [facility] Video Visit from 05/11/2022 in [facility] Video Visit from 11/29/2021 in [facility]  PHQ-2 Total Score 4 4 3 6 5   PHQ-9 Total Score 15 16 13 16 17       Flowsheet Row Video Visit from 10/31/2022 in  [facility] Video Visit from 08/29/2022 in [facility] Video Visit from 06/26/2022 in [facility]  C-SSRS RISK CATEGORY No Risk No Risk Low Risk        Assessment and Plan:   Christian Guerra. Canal is a 62 year old, Caucasian male with a past psychiatric history significant for insomnia, generalized anxiety disorder, panic disorder, and bipolar I disorder who presents to [facility] via virtual video visit for follow-up and medication management.  Patient reports that he continues to have a difficult time with his sleep.  Patient states that there are times where he may go days without sleep and then passed out due to extreme fatigue.  Patient is interested in increasing his trazodone dosage.  Provider recommended increasing his trazodone dosage from 100 mg to 150 mg at bedtime for the management of his sleep.  Patient was agreeable to recommendation.  Although patient scored a 15 on his PHQ-9 screen, he reports that his depressive symptoms have been minimal as of late.  Patient endorses stability through the use of his current dosage of Latuda and Lexapro and denies the need for dosage adjustments at this time.  Patient continues to endorse anxiety, but believes that this is a symptom that will continue to stay with him.  Patient to continue taking his other medications as prescribed.  Patient's medications to be e-prescribed to pharmacy of choice.  Collaboration of Care: Collaboration of Care: Medication Management AEB provider managing patient's psychiatric medications  and Psychiatrist AEB patient being followed by mental health provider  Patient/Guardian was advised Release of Information must be obtained prior to any record release in order to collaborate their care with an outside provider. Patient/Guardian was advised if they have not already done so to contact the registration  department to sign all necessary forms in order for [place] to release information regarding their care.   Consent: Patient/Guardian gives verbal consent for treatment and assignment of benefits for services provided during this visit. Patient/Guardian expressed understanding and agreed to proceed.   1. Bipolar I disorder, most recent episode depressed (HCC)  - lurasidone (LATUDA) 80 MG TABS tablet; Take 1 tablet (80 mg total) by mouth daily with breakfast.  Dispense: 30 tablet; Refill: 2  2. Insomnia due to other mental disorder  - traZODone (DESYREL) 150 MG tablet; Take 1 tablet (150 mg total) by mouth at bedtime as needed for sleep.  Dispense: 30 tablet; Refill: 2  3. Generalized anxiety disorder  - escitalopram (LEXAPRO) 20 MG tablet; Take 1 tablet (20 mg total) by mouth daily.  Dispense: 30 tablet; Refill: 2  4. Panic disorder  - escitalopram (LEXAPRO) 20 MG tablet; Take 1 tablet (20 mg total) by mouth daily.  Dispense: 30 tablet; Refill: 2  Patient to follow-up in 6 weeks Provider spent a total of 14 minutes with the patient/reviewing patient's chart  Meta Hatchet, PA 11/01/2022, 8:51 PM

## 2022-11-01 NOTE — Telephone Encounter (Signed)
Fax received for approval of Lurasidone 80mg  until 10/31/23. Called to notify pharmacy.

## 2023-01-02 ENCOUNTER — Telehealth (INDEPENDENT_AMBULATORY_CARE_PROVIDER_SITE_OTHER): Payer: Medicaid Other | Admitting: Physician Assistant

## 2023-01-02 ENCOUNTER — Encounter (HOSPITAL_COMMUNITY): Payer: Self-pay | Admitting: Physician Assistant

## 2023-01-02 DIAGNOSIS — F410 Panic disorder [episodic paroxysmal anxiety] without agoraphobia: Secondary | ICD-10-CM | POA: Diagnosis not present

## 2023-01-02 DIAGNOSIS — F411 Generalized anxiety disorder: Secondary | ICD-10-CM

## 2023-01-02 DIAGNOSIS — F3130 Bipolar disorder, current episode depressed, mild or moderate severity, unspecified: Secondary | ICD-10-CM

## 2023-01-02 DIAGNOSIS — F5105 Insomnia due to other mental disorder: Secondary | ICD-10-CM

## 2023-01-02 DIAGNOSIS — F99 Mental disorder, not otherwise specified: Secondary | ICD-10-CM

## 2023-01-02 MED ORDER — ESCITALOPRAM OXALATE 20 MG PO TABS
20.0000 mg | ORAL_TABLET | Freq: Every day | ORAL | 2 refills | Status: DC
Start: 2023-01-02 — End: 2023-03-09

## 2023-01-02 MED ORDER — TRAZODONE HCL 150 MG PO TABS
150.0000 mg | ORAL_TABLET | Freq: Every evening | ORAL | 2 refills | Status: DC | PRN
Start: 2023-01-02 — End: 2023-03-09

## 2023-01-02 MED ORDER — LURASIDONE HCL 20 MG PO TABS
ORAL_TABLET | ORAL | 0 refills | Status: DC
Start: 2023-01-02 — End: 2023-08-31

## 2023-01-02 NOTE — Progress Notes (Signed)
BH MD/PA/NP OP Progress Note  Virtual Visit via Video Note  I connected with Christian Guerra on 01/02/23 at  1:00 PM EDT by a video enabled telemedicine application and verified that I am speaking with the correct person using two identifiers.  Location: Patient: Home Provider: Clinic   I discussed the limitations of evaluation and management by telemedicine and the availability of in person appointments. The patient expressed understanding and agreed to proceed.  Follow Up Instructions:  I discussed the assessment and treatment plan with the patient. The patient was provided an opportunity to ask questions and all were answered. The patient agreed with the plan and demonstrated an understanding of the instructions.   The patient was advised to call back or seek an in-person evaluation if the symptoms worsen or if the condition fails to improve as anticipated.  I provided 23 minutes of non-face-to-face time during this encounter.  Meta Hatchet, PA    01/02/2023 6:11 PM Christian Guerra  MRN:  [id number]  Chief Complaint:  Chief Complaint  Patient presents with   Follow-up   Medication Management   HPI:   Christian Guerra is a 62 year old, Caucasian male with a past psychiatric history significant for insomnia, generalized anxiety disorder, panic disorder, and bipolar I disorder who presents to [facility] via virtual video visit for follow-up and medication management.  Patient is currently being managed on the following psychiatric medications:  Lurasidone 80 mg daily Escitalopram 20 mg daily Trazodone 150 mg bedtime  Patient presents to the encounter stating that he has been feeling rough due to not sleeping well.  He also states that he has been unable to receive his [place] since his insurance will not cover the medication.  Patient reports that he is not taking his Latuda prescription since the last encounter.  Although  patient believes that the brand name, Kasandra Knudsen, is more effective, patient is interested in taking the generic form of the medication (lurasidone).  Patient endorses minimal depression stating that he has had a couple of bad days, but after falling asleep, he feels much better.  Patient endorses elevated anxiety and rates his anxiety at 9 out of 10.  Patient's only stressor revolves around his mother recently falling down the stairs.  He reports that she is okay at this time.  A PHQ-9 screen was performed with the patient scoring a 14.  GAD-7 screen was also performed with the patient scoring a 15.  Patient is alert and oriented x 4, calm, cooperative, and fully engaged in conversation during the encounter.  Despite not having access to [facility], patient describes his mood as upbeat.  Patient denies suicidal or homicidal ideations.  He further denies auditory or visual hallucinations and does not appear to be responding to internal/external stimuli.  Patient endorses fair sleep and receives on average 4 to 5 hours of sleep per night.  Patient endorses good appetite and eats on average 2 meals per day.  Patient endorses alcohol consumption sparingly.  Patient endorses tobacco use in the form of dip.  Patient denies illicit drug use.  Visit Diagnosis:    ICD-10-CM   1. Bipolar I disorder, most recent episode depressed (HCC)  F31.30 lurasidone (LATUDA) 20 MG TABS tablet    2. Insomnia due to other mental disorder  F51.05 traZODone (DESYREL) 150 MG tablet   F99     3. Generalized anxiety disorder  F41.1 escitalopram (LEXAPRO) 20 MG tablet    4. Panic  disorder  F41.0 escitalopram (LEXAPRO) 20 MG tablet      Past Psychiatric History:  Panic disorder Generalized anxiety disorder Insomnia Bipolar disorder  Past Medical History:  Past Medical History:  Diagnosis Date   Anxiety    Bilateral swelling of feet and ankles    Bruised kidney 09/10/2016   Chronic lower back pain    Chronic pain    Daily  headache    Diastolic dysfunction with chronic heart failure (HCC)    Fundic gland polyps of stomach, benign    GERD (gastroesophageal reflux disease)    Hepatitis C    "treated; got down to 0 load 6-8 years ago" (09/13/2016)   Hypertension    Memory difficulties    "in the last week or so" (09/13/2016)   Morbid obesity (HCC)    Personal history of colonic adenoma 01/20/2013   Recurrent falls    "recently" (09/13/2016)   Sleep apnea    "wore mask at night before; couldn't tolerate it" (09/13/2016)   Wears glasses     Past Surgical History:  Procedure Laterality Date   ANKLE FRACTURE SURGERY Bilateral 2001   APPENDECTOMY  1975   BIOPSY  11/11/2018   Procedure: BIOPSY;  Surgeon: Iva Boop, MD;  Location: [facility];  Service: Endoscopy;;   COLONOSCOPY     COLONOSCOPY WITH PROPOFOL N/A 11/11/2018   Procedure: COLONOSCOPY WITH PROPOFOL;  Surgeon: Iva Boop, MD;  Location: [facility];  Service: Endoscopy;  Laterality: N/A;   ESOPHAGOGASTRODUODENOSCOPY (EGD) WITH PROPOFOL N/A 11/11/2018   Procedure: ESOPHAGOGASTRODUODENOSCOPY (EGD) WITH PROPOFOL;  Surgeon: Iva Boop, MD;  Location: [facility];  Service: Endoscopy;  Laterality: N/A;   FOREARM SURGERY Left 2001   "chain saw injury"   POLYPECTOMY  11/11/2018   Procedure: POLYPECTOMY;  Surgeon: Iva Boop, MD;  Location: [facility];  Service: Endoscopy;;   TESTICLE TORSION REDUCTION  1980    Family Psychiatric History:  Patient denies a family history of psychiatric illness   Family History:  Family History  Problem Relation Age of Onset   Hypertension Father        old age--60   Other Father        enlarged prostate   Colon cancer Neg Hx     Social History:  Social History   Socioeconomic History   Marital status: Single    Spouse name: Not on file   Number of children: 0   Years of education: Not on file   Highest education level: Not on file  Occupational History   Occupation: unemployeed   Tobacco Use   Smoking status: Never   Smokeless tobacco: Current    Types: Snuff  Vaping Use   Vaping status: Never Used  Substance and Sexual Activity   Alcohol use: Not Currently    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, not now, and non-smoker   Social Determinants of Health   Financial Resource Strain: Not at Risk (07/09/2022)   Received from [employer], [place]  Strain    Financial Resource Strain: 1  Food Insecurity: Not at Risk (07/09/2022)   Received from [place], [place]   Food Insecurity    Food: 1  Transportation Needs: Not at Risk (07/09/2022)   Received from [place], [employer] Needs    Transportation: 1  Physical Activity: Not on File (05/29/2022)   Received from [place], [place]   Physical Activity    Physical Activity: 0  Stress: Not on File (05/29/2022)   Received from [facility], [place]   Stress    Stress: 0  Social Connections: Not on File (05/29/2022)   Received from [place], [place]   Social Connections    Social Connections and Isolation: 0    Allergies:  Allergies  Allergen Reactions   Codeine Itching    Metabolic Disorder Labs: Lab Results  Component Value Date   HGBA1C 5.5 03/03/2021   MPG 114 06/15/2016   MPG 108 09/23/2012   No results found for: "PROLACTIN" Lab Results  Component Value Date   CHOL 88 03/03/2021   TRIG 149 03/03/2021   HDL 16 (A) 03/03/2021   CHOLHDL 4.4 06/05/2018   VLDL 32 (H) 07/02/2016   LDLCALC 46 03/03/2021   LDLCALC 77 06/05/2018   Lab Results  Component Value Date   TSH 0.20 (A) 03/03/2021    TSH 1.970 06/05/2018    Therapeutic Level Labs: No results found for: "LITHIUM" No results found for: "VALPROATE" No results found for: "CBMZ"  Current Medications: Current Outpatient Medications  Medication Sig Dispense Refill   albuterol (VENTOLIN HFA) 108 (90 Base) MCG/ACT inhaler Inhale 1 puff into the lungs every 6 (six) hours as needed for wheezing or shortness of breath. 1 Inhaler 0   atenolol (TENORMIN) 50 MG tablet Take 1 tablet (50 mg total) by mouth daily. 30 tablet 2   cyclobenzaprine (FLEXERIL) 10 MG tablet Take 10 mg by mouth 3 (three) times daily as needed for muscle spasms.     diphenhydrAMINE (BENADRYL) 25 MG tablet Take 50 mg by mouth every 8 (eight) hours as needed for itching or sleep.     escitalopram (LEXAPRO) 20 MG tablet Take 1 tablet (20 mg total) by mouth daily. 30 tablet 2   finasteride (PROSCAR) 5 MG tablet Take 5 mg by mouth daily. (Patient not taking: Reported on 04/30/2022)     hydrochlorothiazide (HYDRODIURIL) 25 MG tablet Take 1 tablet (25 mg total) by mouth daily.     lurasidone (LATUDA) 20 MG TABS tablet Take 1 tablet (20 mg total) by mouth daily with breakfast for 6 days, THEN 2 tablets (40 mg total) daily with breakfast for 6 days, THEN 4 tablets (80 mg total) daily with breakfast for 20 days. 98 tablet 0   minocycline (MINOCIN) 100 MG capsule Take 100 mg by mouth 3 (three) times daily.     oxyCODONE (ROXICODONE) 15 MG immediate release tablet Take 15 mg by mouth every 8 (eight) hours.     pantoprazole (PROTONIX) 40 MG tablet Take 1 tablet (40 mg total) by mouth 2 (two) times daily. 60 tablet 1   tamsulosin (FLOMAX) 0.4 MG CAPS capsule Take 1 capsule (0.4 mg total) by mouth daily at 2 PM. 30 capsule 6   traZODone (DESYREL) 150 MG tablet Take 1 tablet (150 mg total) by mouth at bedtime as needed for sleep. 30 tablet 2   No current facility-administered medications for this visit.     Musculoskeletal: Strength & Muscle Tone: Unable to assess due to  telemedicine visit Gait &  Station: Unable to assess due to telemedicine visit Patient leans: Unable to assess due to telemedicine visit  Psychiatric Specialty Exam: Review of Systems  Psychiatric/Behavioral:  Positive for sleep disturbance. Negative for decreased concentration, dysphoric mood, hallucinations, self-injury and suicidal ideas. The patient is nervous/anxious. The patient is not hyperactive.     There were no vitals taken for this visit.There is no height or weight on file to calculate BMI.  General Appearance: Fairly Groomed  Eye Contact:  Good  Speech:  Clear and Coherent and Normal Rate  Volume:  Normal  Mood:  Anxious and Depressed  Affect:  Appropriate  Thought Process:  Coherent, Goal Directed, and Descriptions of Associations: Intact  Orientation:  Full (Time, Place, and Person)  Thought Content: WDL   Suicidal Thoughts:  No  Homicidal Thoughts:  No  Memory:  Immediate;   Good Recent;   Good Remote;   Fair  Judgement:  Good  Insight:  Fair  Psychomotor Activity:  Normal  Concentration:  Concentration: Good and Attention Span: Good  Recall:  Good  Fund of Knowledge: Fair  Language: Good  Akathisia:  No  Handed:  Right  AIMS (if indicated): not done  Assets:  Communication Skills Desire for Improvement Housing Social Support  ADL's:  Impaired  Cognition: WNL  Sleep:  Fair   Screenings: GAD-7    Flowsheet Row Video Visit from 01/02/2023 in [facility] Video Visit from 10/31/2022 in [facility] Video Visit from 08/29/2022 in [facility] Video Visit from 06/26/2022 in [facility] Video Visit from 05/11/2022 in [facility]  Total GAD-7 Score 15 12 18 11 20       PHQ2-9    Flowsheet Row Video Visit from 01/02/2023 in [facility] Video Visit from 10/31/2022 in [facility] Video Visit from 08/29/2022 in [facility] Video Visit from 06/26/2022 in [facility] Video Visit from 05/11/2022 in [facility]  PHQ-2 Total Score 3 4 4 3 6   PHQ-9 Total Score 14 15 16 13 16       Flowsheet Row Video Visit from 01/02/2023 in [facility] Video Visit from 10/31/2022 in [facility] Video Visit from 08/29/2022 in [facility]  C-SSRS RISK CATEGORY No Risk No Risk No Risk        Assessment and Plan:   Christian Guerra is a 62 year old, Caucasian male with a past psychiatric history significant for insomnia, generalized anxiety disorder, panic disorder, and bipolar I disorder who presents to [facility] via virtual video visit for follow-up and medication management.  Patient presents today encounter stating that he has not been on his Latuda prescription since the last encounter.  Patient's reason for not taking Kasandra Knudsen is due to his insurance not covering the medication.  Although patient reports that Kasandra Knudsen has been effective in managing his symptoms, patient is okay with taking the generic form of the medication (lurasidone).  Patient endorses some depressive symptoms but states that his symptoms have not been as bad after getting a good night's rest.  Patient continues to endorse ongoing anxiety as well as issues with sleep.  Provider to restart patient on lurasidone 20 mg for 6 days, followed by 40 mg for 6 days, then 80 mg daily for the management of his depressive  symptoms and for mood stability.  Provider also recommended increasing patient's trazodone dosage from 100 mg to 150 mg at bedtime for the management of his sleep.  Patient was agreeable to recommendations.  Patient's medications to be e-prescribed to pharmacy of choice.  Collaboration of Care: Collaboration of  Care: Medication Management AEB provider managing patient's psychiatric medications and Psychiatrist AEB patient being followed by mental health provider  Patient/Guardian was advised Release of Information must be obtained prior to any record release in order to collaborate their care with an outside provider. Patient/Guardian was advised if they have not already done so to contact the registration department to sign all necessary forms in order for [place] to release information regarding their care.   Consent: Patient/Guardian gives verbal consent for treatment and assignment of benefits for services provided during this visit. Patient/Guardian expressed understanding and agreed to proceed.   1. Bipolar I disorder, most recent episode depressed (HCC)  - lurasidone (LATUDA) 20 MG TABS tablet; Take 1 tablet (20 mg total) by mouth daily with breakfast for 6 days, THEN 2 tablets (40 mg total) daily with breakfast for 6 days, THEN 4 tablets (80 mg total) daily with breakfast for 20 days.  Dispense: 98 tablet; Refill: 0  2. Insomnia due to other mental disorder  - traZODone (DESYREL) 150 MG tablet; Take 1 tablet (150 mg total) by mouth at bedtime as needed for sleep.  Dispense: 30 tablet; Refill: 2  3. Generalized anxiety disorder  - escitalopram (LEXAPRO) 20 MG tablet; Take 1 tablet (20 mg total) by mouth daily.  Dispense: 30 tablet; Refill: 2  4. Panic disorder  - escitalopram (LEXAPRO) 20 MG tablet; Take 1 tablet (20 mg total) by mouth daily.  Dispense: 30 tablet; Refill: 2  Patient to follow-up in 2 months Provider spent a total of 23 minutes with the patient/reviewing patient's chart  Meta Hatchet, PA 01/02/2023, 6:11 PM

## 2023-01-04 ENCOUNTER — Telehealth (HOSPITAL_COMMUNITY): Payer: Self-pay | Admitting: *Deleted

## 2023-01-04 NOTE — Telephone Encounter (Signed)
Fax received for Prior authorization of Lurasidone 20mg . Submitted with case ID [id number].

## 2023-01-29 ENCOUNTER — Telehealth: Payer: Self-pay | Admitting: Internal Medicine

## 2023-01-29 NOTE — Telephone Encounter (Signed)
Pt stated that he has been having dark stool for 3 weeks now and has seen his PCP twice in the last 3 weeks. Pt stated that his WBC is 19,000. Pt stated that his PCP is requesting his to see his GI Dr. Rock Nephew was scheduled to see Dr. Leone Payor on 02/05/2023 at 10:10 AM. Pt made aware. Pt verbalized understanding with all questions answered.

## 2023-01-29 NOTE — Telephone Encounter (Signed)
Christian Guerra pt please route to the appropriate nurse.

## 2023-01-29 NOTE — Telephone Encounter (Signed)
Inbound call from patient stating he recently saw his PCP and his white blood cell count is very high. Patient states he is also having black stools and abdominal pain. Patient is requesting a call back to discuss further. Please advise, thank you.

## 2023-01-30 ENCOUNTER — Encounter: Payer: Self-pay | Admitting: Family Medicine

## 2023-01-30 ENCOUNTER — Other Ambulatory Visit: Payer: Self-pay | Admitting: Family Medicine

## 2023-01-30 DIAGNOSIS — G629 Polyneuropathy, unspecified: Secondary | ICD-10-CM

## 2023-01-30 DIAGNOSIS — R208 Other disturbances of skin sensation: Secondary | ICD-10-CM

## 2023-02-05 ENCOUNTER — Ambulatory Visit: Payer: Medicaid Other | Admitting: Internal Medicine

## 2023-02-05 ENCOUNTER — Telehealth: Payer: Self-pay | Admitting: Hematology

## 2023-02-05 NOTE — Telephone Encounter (Signed)
Scheduled appointments per referral. Patient is aware of the appointment time and date as well as the address. Patient was informed to arrive 10-15 minutes prior with updated insurance information. All questions were answered.

## 2023-02-07 ENCOUNTER — Other Ambulatory Visit: Payer: Self-pay | Admitting: Family Medicine

## 2023-02-07 ENCOUNTER — Ambulatory Visit
Admission: RE | Admit: 2023-02-07 | Discharge: 2023-02-07 | Disposition: A | Discharge status: Home / Self Care | Payer: Medicaid Other | Source: Ambulatory Visit | Attending: Family Medicine | Admitting: Family Medicine

## 2023-02-07 DIAGNOSIS — R208 Other disturbances of skin sensation: Secondary | ICD-10-CM

## 2023-02-07 DIAGNOSIS — G629 Polyneuropathy, unspecified: Secondary | ICD-10-CM

## 2023-02-13 ENCOUNTER — Other Ambulatory Visit (HOSPITAL_COMMUNITY): Payer: Self-pay | Admitting: Physician Assistant

## 2023-02-13 DIAGNOSIS — F3130 Bipolar disorder, current episode depressed, mild or moderate severity, unspecified: Secondary | ICD-10-CM

## 2023-03-04 ENCOUNTER — Inpatient Hospital Stay: Payer: Medicaid Other

## 2023-03-04 ENCOUNTER — Inpatient Hospital Stay: Payer: Medicaid Other | Attending: Hematology | Admitting: Hematology

## 2023-03-04 VITALS — BP 141/74 | HR 75 | Temp 97.7°F | Resp 18

## 2023-03-04 DIAGNOSIS — M25559 Pain in unspecified hip: Secondary | ICD-10-CM | POA: Insufficient documentation

## 2023-03-04 DIAGNOSIS — D75839 Thrombocytosis, unspecified: Secondary | ICD-10-CM

## 2023-03-04 DIAGNOSIS — R590 Localized enlarged lymph nodes: Secondary | ICD-10-CM | POA: Insufficient documentation

## 2023-03-04 DIAGNOSIS — G4730 Sleep apnea, unspecified: Secondary | ICD-10-CM | POA: Insufficient documentation

## 2023-03-04 DIAGNOSIS — M549 Dorsalgia, unspecified: Secondary | ICD-10-CM | POA: Insufficient documentation

## 2023-03-04 DIAGNOSIS — I509 Heart failure, unspecified: Secondary | ICD-10-CM | POA: Diagnosis not present

## 2023-03-04 DIAGNOSIS — R1031 Right lower quadrant pain: Secondary | ICD-10-CM | POA: Insufficient documentation

## 2023-03-04 DIAGNOSIS — D72829 Elevated white blood cell count, unspecified: Secondary | ICD-10-CM | POA: Diagnosis not present

## 2023-03-04 DIAGNOSIS — R700 Elevated erythrocyte sedimentation rate: Secondary | ICD-10-CM | POA: Insufficient documentation

## 2023-03-04 LAB — CBC WITH DIFFERENTIAL (CANCER CENTER ONLY)
Abs Immature Granulocytes: 0.1 10*3/uL — ABNORMAL HIGH (ref 0.00–0.07)
Basophils Absolute: 0 10*3/uL (ref 0.0–0.1)
Basophils Relative: 0 %
Eosinophils Absolute: 0 10*3/uL (ref 0.0–0.5)
Eosinophils Relative: 0 %
HCT: 39.1 % (ref 39.0–52.0)
Hemoglobin: 12.6 g/dL — ABNORMAL LOW (ref 13.0–17.0)
Immature Granulocytes: 1 %
Lymphocytes Relative: 9 %
Lymphs Abs: 1.4 10*3/uL (ref 0.7–4.0)
MCH: 26.5 pg (ref 26.0–34.0)
MCHC: 32.2 g/dL (ref 30.0–36.0)
MCV: 82.1 fL (ref 80.0–100.0)
Monocytes Absolute: 0.7 10*3/uL (ref 0.1–1.0)
Monocytes Relative: 5 %
Neutro Abs: 13.4 10*3/uL — ABNORMAL HIGH (ref 1.7–7.7)
Neutrophils Relative %: 85 %
Platelet Count: 423 10*3/uL — ABNORMAL HIGH (ref 150–400)
RBC: 4.76 MIL/uL (ref 4.22–5.81)
RDW: 14.7 % (ref 11.5–15.5)
WBC Count: 15.6 10*3/uL — ABNORMAL HIGH (ref 4.0–10.5)
nRBC: 0 % (ref 0.0–0.2)

## 2023-03-04 LAB — CMP (CANCER CENTER ONLY)
ALT: 11 U/L (ref 0–44)
AST: 10 U/L — ABNORMAL LOW (ref 15–41)
Albumin: 3.7 g/dL (ref 3.5–5.0)
Alkaline Phosphatase: 88 U/L (ref 38–126)
Anion gap: 10 (ref 5–15)
BUN: 10 mg/dL (ref 8–23)
CO2: 36 mmol/L — ABNORMAL HIGH (ref 22–32)
Calcium: 9.5 mg/dL (ref 8.9–10.3)
Chloride: 94 mmol/L — ABNORMAL LOW (ref 98–111)
Creatinine: 1.11 mg/dL (ref 0.61–1.24)
GFR, Estimated: >60 mL/min (ref >60)
Glucose, Bld: 120 mg/dL — ABNORMAL HIGH (ref 70–99)
Potassium: 3.8 mmol/L (ref 3.5–5.1)
Sodium: 140 mmol/L (ref 135–145)
Total Bilirubin: 0.5 mg/dL (ref <1.2)
Total Protein: 7.9 g/dL (ref 6.5–8.1)

## 2023-03-04 LAB — SEDIMENTATION RATE: Sed Rate: 76 mm/h — ABNORMAL HIGH (ref 0–16)

## 2023-03-04 LAB — LACTATE DEHYDROGENASE: LDH: 146 U/L (ref 98–192)

## 2023-03-04 NOTE — Progress Notes (Signed)
HEMATOLOGY/ONCOLOGY CONSULTATION NOTE  Date of Service: 03/04/2023  Patient Care Team: Jearld Lesch, MD as PCP - General (Family Medicine)  CHIEF COMPLAINTS/PURPOSE OF CONSULTATION:  Neutrophilic Leukocytosis  HISTORY OF PRESENTING ILLNESS:   Christian Guerra is a wonderful 62 y.o. male who has been referred to [place] by Dr. Sherron Flemings for evaluation and management of Neutrophilic Leukocytosis.   CBC from 07/09/2022 showed elevated WBC at 15.0 K, lab range from 3.4 to 10.8, elevated neutrophils of 12.5, lab range from 1.4 to 7.0. CBC from 10/29/2022 showed elevated WBC at 14.1 K with elevated neutrophils of 9.5. CBC from 01/17/2023 shpwed elevated WBC of 19.0 K, elevated platelets of 455 K, and elevated neutrophils of 16.1. CBC from 01/29/2023 showed elevated WBC at 19.5 K, elevated platelets of 498 K, and elevated neutrophils of 16.5. WBC, platelets, and neutrophils have been elevated for at least a year.   Patient is accompanied by his brother during this visit. Patient notes that the first time he was told of elevated WBC, platelets, and neutrophils was around one year ago.   Patient notes he has pinched nerves which has been causing him back pain and hip pain. He also complains of loss of sensation on his legs, which affects his walking.   Patient notes he takes Albuterol as needed.  He has past medical history of sleep apnea and he used to use Cpap machine. He currently does not use Cpap machine because he lost the machine.   Patient notes he has past medical history of cystic acne throughout his back.   Patient notes he has fungal/yeast infection around his groin area and bilateral armpit, he uses a anti-fungal ointment everyday. He notes he currently showers every 2-3 days.   He complains of chronic occasional right groin pain. He denies any past medical history of prostate issues or urine issues.   Patient complains of fatigue and lethargy for the past year. Patient  notes he has also been losing weight due to weight loss medication.   He denies any new infection issues, fever, chills, night sweats, chest pain, abdominal pain, or new lump/bump. He denies any dental problems.  He denies any medical history of blood clots. He does have congestive heart failure and follows-up with his PCP. He has never been to a cardiologist.   Patient was treated for Hepatitis-C around 2000.  MEDICAL HISTORY:  Past Medical History:  Diagnosis Date   Anxiety    Bilateral swelling of feet and ankles    Bipolar 1 disorder (HCC)    Bruised kidney 09/10/2016   Chronic lower back pain    Chronic pain    Daily headache    Diastolic dysfunction with chronic heart failure (HCC)    Fundic gland polyps of stomach, benign    GERD (gastroesophageal reflux disease)    Hepatitis C    "treated; got down to 0 load 6-8 years ago" (09/13/2016)   Hypertension    Insomnia    Memory difficulties    "in the last week or so" (09/13/2016)   Morbid obesity (HCC)    Personal history of colonic adenoma 01/20/2013   Recurrent falls    "recently" (09/13/2016)   Sleep apnea    "wore mask at night before; couldn't tolerate it" (09/13/2016)   Wears glasses     SURGICAL HISTORY: Past Surgical History:  Procedure Laterality Date   ANKLE FRACTURE SURGERY Bilateral 2001   APPENDECTOMY  1975   BIOPSY  11/11/2018  Procedure: BIOPSY;  Surgeon: Iva Boop, MD;  Location: [facility];  Service: Endoscopy;;   COLONOSCOPY     COLONOSCOPY WITH PROPOFOL N/A 11/11/2018   Procedure: COLONOSCOPY WITH PROPOFOL;  Surgeon: Iva Boop, MD;  Location: [facility];  Service: Endoscopy;  Laterality: N/A;   ESOPHAGOGASTRODUODENOSCOPY (EGD) WITH PROPOFOL N/A 11/11/2018   Procedure: ESOPHAGOGASTRODUODENOSCOPY (EGD) WITH PROPOFOL;  Surgeon: Iva Boop, MD;  Location: [facility];  Service: Endoscopy;  Laterality: N/A;   FOREARM SURGERY Left 2001   "chain saw injury"   POLYPECTOMY  11/11/2018    Procedure: POLYPECTOMY;  Surgeon: Iva Boop, MD;  Location: [facility];  Service: Endoscopy;;   TESTICLE TORSION REDUCTION  1980    SOCIAL HISTORY: Social History   Socioeconomic History   Marital status: Single    Spouse name: Not on file   Number of children: 0   Years of education: Not on file   Highest education level: Not on file  Occupational History   Occupation: unemployeed  Tobacco Use   Smoking status: Never   Smokeless tobacco: Current    Types: Snuff  Vaping Use   Vaping status: Never Used  Substance and Sexual Activity   Alcohol use: Not Currently    Comment: 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, not now, and non-smoker   Social Determinants of Health   Financial Resource Strain: Not at Risk (07/09/2022)   Received from [employer], [employer]    Financial Resource Strain: 1  Food Insecurity: Not at Risk (07/09/2022)   Received from [place], [place]   Food Insecurity    Food: 1  Transportation Needs: Not at Risk (07/09/2022)   Received from [place], [employer] Needs    Transportation: 1  Physical Activity: Not on File (05/29/2022)   Received from [place], [place]   Physical Activity    Physical Activity: 0  Stress: Not on File (05/29/2022)   Received from [facility], [place]   Stress    Stress: 0  Social Connections: Not on File (01/01/2023)   Received from [facility]   Social Connections    Connectedness: 0  Intimate Partner Violence: Not At Risk (08/30/2020)   Humiliation, Afraid, Rape, and Kick questionnaire    Fear of Current or  Ex-Partner: No    Emotionally Abused: No    Physically Abused: No    Sexually Abused: No    FAMILY HISTORY: Family History  Problem Relation Age of Onset   Hypertension Father        old age--77   Other Father        enlarged prostate   Colon cancer Neg Hx     ALLERGIES:  is allergic to codeine.  MEDICATIONS:  Current Outpatient Medications  Medication Sig Dispense Refill   albuterol (VENTOLIN HFA) 108 (90 Base) MCG/ACT inhaler Inhale 1 puff into the lungs every 6 (six) hours as needed for wheezing or shortness of breath. 1 Inhaler 0   atenolol (TENORMIN) 50 MG tablet Take 1 tablet (50  mg total) by mouth daily. 30 tablet 2   cyclobenzaprine (FLEXERIL) 10 MG tablet Take 10 mg by mouth 3 (three) times daily as needed for muscle spasms.     diphenhydrAMINE (BENADRYL) 25 MG tablet Take 50 mg by mouth every 8 (eight) hours as needed for itching or sleep.     escitalopram (LEXAPRO) 20 MG tablet Take 1 tablet (20 mg total) by mouth daily. 30 tablet 2   finasteride (PROSCAR) 5 MG tablet Take 5 mg by mouth daily. (Patient not taking: Reported on 04/30/2022)     hydrochlorothiazide (HYDRODIURIL) 25 MG tablet Take 1 tablet (25 mg total) by mouth daily.     lurasidone (LATUDA) 20 MG TABS tablet Take 1 tablet (20 mg total) by mouth daily with breakfast for 6 days, THEN 2 tablets (40 mg total) daily with breakfast for 6 days, THEN 4 tablets (80 mg total) daily with breakfast for 20 days. 98 tablet 0   minocycline (MINOCIN) 100 MG capsule Take 100 mg by mouth 3 (three) times daily.     oxyCODONE (ROXICODONE) 15 MG immediate release tablet Take 15 mg by mouth every 8 (eight) hours.     pantoprazole (PROTONIX) 40 MG tablet Take 1 tablet (40 mg total) by mouth 2 (two) times daily. 60 tablet 1   tamsulosin (FLOMAX) 0.4 MG CAPS capsule Take 1 capsule (0.4 mg total) by mouth daily at 2 PM. 30 capsule 6   traZODone (DESYREL) 150 MG tablet Take 1 tablet (150 mg total) by mouth at bedtime as needed for sleep.  30 tablet 2   No current facility-administered medications for this visit.    REVIEW OF SYSTEMS:    10 Point review of Systems was done is negative except as noted above.  PHYSICAL EXAMINATION: ECOG PERFORMANCE STATUS: 1 - Symptomatic but completely ambulatory  . Vitals:   03/04/23 1449  BP: (!) 141/74  Pulse: 75  Resp: 18  Temp: 97.7 F (36.5 C)  SpO2: 100%   GENERAL:alert, in no acute distress and comfortable SKIN: no acute rashes, no significant lesions EYES: conjunctiva are pink and non-injected, sclera anicteric OROPHARYNX: MMM, no exudates, no oropharyngeal erythema or ulceration NECK: supple, no JVD LYMPH:  no palpable lymphadenopathy in the cervical, axillary or inguinal regions LUNGS: clear to auscultation b/l with normal respiratory effort HEART: regular rate & rhythm ABDOMEN:  normoactive bowel sounds , non tender, not distended. Extremity: no pedal edema PSYCH: alert & oriented x 3 with fluent speech NEURO: no focal motor/sensory deficits  LABORATORY DATA:  I have reviewed the data as listed  .    Latest Ref Rng & Units 03/04/2023    3:37 PM 05/03/2022    4:59 AM 05/02/2022    4:27 AM  CBC  WBC 4.0 - 10.5 K/uL 15.6  8.2  11.1   Hemoglobin 13.0 - 17.0 g/dL 40.9  81.1  91.4   Hematocrit 39.0 - 52.0 % 39.1  39.5  39.7   Platelets 150 - 400 K/uL 423  212  237     .    Latest Ref Rng & Units 03/04/2023    3:37 PM 05/03/2022    4:59 AM 05/02/2022    4:27 AM  CMP  Glucose 70 - 99 mg/dL 782  94  956   BUN 8 - 23 mg/dL 10  26  28    Creatinine 0.61 - 1.24 mg/dL 2.13  0.86  5.78   Sodium 135 - 145 mmol/L 140  134  132  Potassium 3.5 - 5.1 mmol/L 3.8  4.2  4.3   Chloride 98 - 111 mmol/L 94  94  93   CO2 22 - 32 mmol/L 36  32  31   Calcium 8.9 - 10.3 mg/dL 9.5  8.1  8.2   Total Protein 6.5 - 8.1 g/dL 7.9   5.6   Total Bilirubin <1.2 mg/dL 0.5   1.1   Alkaline Phos 38 - 126 U/L 88   179   AST 15 - 41 U/L 10   40   ALT 0 - 44 U/L 11   49       RADIOGRAPHIC STUDIES: I have personally reviewed the radiological images as listed and agreed with the findings in the report. US ARTERIAL ABI (SCREENING LOWER EXTREMITY)  Result Date: 02/07/2023 CLINICAL DATA:  Decreased sensation in bilateral lower extremities for 6 years with discoloration and difficulty standing Hypertension EXAM: NONINVASIVE PHYSIOLOGIC VASCULAR STUDY OF BILATERAL LOWER EXTREMITIES TECHNIQUE: Evaluation of both lower extremities were performed at rest, including calculation of ankle-brachial indices with single level pressure measurements and doppler and pulse volume recording. COMPARISON:  None available. FINDINGS: Right ABI:  1.07 Right TBI: 0.89 Left ABI:  1.08 LeftTBI: 0.92 Right Lower Extremity:  Normal arterial waveforms at the ankle. Left Lower Extremity:  Normal arterial waveforms at the ankle. 1.0-1.4 Normal IMPRESSION: Normal ABI examination of the lower extremities. Electronically Signed   By: Acquanetta Belling M.D.   On: 02/07/2023 14:34    ASSESSMENT & PLAN:   62 yo with   1) Persistent Neutrophilic leucocytosis ? Clonal vs reactive. 2) Axillary LNadenopathy with significantly elevated sed rate ?Hodgkins lymphoma PLAN: -Discussed with the patient what could cause Neutrophilic Leukocytosis.  -Discussed with the patient that the next plan is to have labs done after this visit and to get CT CAP scan. Patient agrees. -Schedule patient for CT chest/abdomen/pelvis scan in 1 week since the patient has enlarged lymph nodes.  -Recommend to follow-up with PCP regarding sleep apnea and  a new CPAP machine.  -Schedule Phone visit in 3-4 weeks.  -Answered patient's questions.    . Orders Placed This Encounter  Procedures   CT CHEST ABDOMEN PELVIS W CONTRAST    Standing Status:   Future    Standing Expiration Date:   03/03/2024    Order Specific Question:   If indicated for the ordered procedure, I authorize the administration of contrast media per Radiology  protocol    Answer:   Yes    Order Specific Question:   Does the patient have a contrast media/X-ray dye allergy?    Answer:   No    Order Specific Question:   Preferred imaging location?    Answer:   [facility]    Order Specific Question:   If indicated for the ordered procedure, I authorize the administration of oral contrast media per Radiology protocol    Answer:   Yes   CBC with Differential (Cancer Center Only)    Standing Status:   Future    Number of Occurrences:   1    Standing Expiration Date:   03/03/2024   CMP (Cancer Center only)    Standing Status:   Future    Number of Occurrences:   1    Standing Expiration Date:   03/03/2024   Lactate dehydrogenase    Standing Status:   Future    Number of Occurrences:   1    Standing Expiration Date:   03/03/2024  Sedimentation rate    Standing Status:   Future    Number of Occurrences:   1    Standing Expiration Date:   03/03/2024   JAK2 (including V617F and Exon 12), MPL, and CALR-Next Generation Sequencing    Standing Status:   Future    Number of Occurrences:   1    Standing Expiration Date:   03/03/2024   BCR ABL1 FISH ([facility])    Standing Status:   Future    Number of Occurrences:   1    Standing Expiration Date:   03/03/2024    FOLLOW-UP: Labs today CT chest/abd/pelvis in 1 week Phone visit with Dr Candise Che in 3-4 weeks  The total time spent in the appointment was 60 minutes* .  All of the patient's questions were answered with apparent satisfaction. The patient knows to call the clinic with any problems, questions or concerns.   Wyvonnia Lora MD MS AAHIVMS [facility] Hematology/Oncology Physician [facility]  .*Total Encounter Time as defined by the Centers for Medicare and Medicaid Services includes, in addition to the face-to-face time of a patient visit (documented in the note above) non-face-to-face time: obtaining and reviewing outside history, ordering and reviewing medications, tests or  procedures, care coordination (communications with other health care professionals or caregivers) and documentation in the medical record.   03/04/2023 12:28 PM   I,Param Shah,acting as a scribe for Wyvonnia Lora, MD.,have documented all relevant documentation on the behalf of Wyvonnia Lora, MD,as directed by  Wyvonnia Lora, MD while in the presence of Wyvonnia Lora, MD.  .I have reviewed the above documentation for accuracy and completeness, and I agree with the above. Johney Maine MD

## 2023-03-06 ENCOUNTER — Telehealth (HOSPITAL_COMMUNITY): Payer: Medicaid Other | Admitting: Physician Assistant

## 2023-03-06 DIAGNOSIS — F5105 Insomnia due to other mental disorder: Secondary | ICD-10-CM

## 2023-03-06 DIAGNOSIS — F3130 Bipolar disorder, current episode depressed, mild or moderate severity, unspecified: Secondary | ICD-10-CM

## 2023-03-06 DIAGNOSIS — F411 Generalized anxiety disorder: Secondary | ICD-10-CM | POA: Diagnosis not present

## 2023-03-06 DIAGNOSIS — F410 Panic disorder [episodic paroxysmal anxiety] without agoraphobia: Secondary | ICD-10-CM | POA: Diagnosis not present

## 2023-03-06 DIAGNOSIS — F99 Mental disorder, not otherwise specified: Secondary | ICD-10-CM

## 2023-03-07 ENCOUNTER — Telehealth: Payer: Self-pay | Admitting: Hematology

## 2023-03-07 NOTE — Telephone Encounter (Signed)
Patient is aware of scheduled appointment times/dates

## 2023-03-09 ENCOUNTER — Encounter (HOSPITAL_COMMUNITY): Payer: Self-pay | Admitting: Physician Assistant

## 2023-03-09 MED ORDER — ESCITALOPRAM OXALATE 20 MG PO TABS: 20.0000 mg | ORAL_TABLET | Freq: Every day | ORAL | 1 refills | Status: DC

## 2023-03-09 MED ORDER — TRAZODONE HCL 150 MG PO TABS: 150.0000 mg | ORAL_TABLET | Freq: Every evening | ORAL | 1 refills | Status: DC | PRN

## 2023-03-09 NOTE — Progress Notes (Cosign Needed Addendum)
BH MD/PA/NP OP Progress Note  Virtual Visit via Video Note  I connected with Christian Guerra on 03/06/23 at  1:00 PM EST by a video enabled telemedicine application and verified that I am speaking with the correct person using two identifiers.  Location: Patient: Home Provider: Clinic   I discussed the limitations of evaluation and management by telemedicine and the availability of in person appointments. The patient expressed understanding and agreed to proceed.  Follow Up Instructions:  I discussed the assessment and treatment plan with the patient. The patient was provided an opportunity to ask questions and all were answered. The patient agreed with the plan and demonstrated an understanding of the instructions.   The patient was advised to call back or seek an in-person evaluation if the symptoms worsen or if the condition fails to improve as anticipated.  I provided 32 minutes of non-face-to-face time during this encounter.  Meta Hatchet, PA    03/06/2023 4:29 PM Christian Guerra  MRN:  [id number]  Chief Complaint:  Chief Complaint  Patient presents with   Follow-up   Medication Refill   HPI:   Christian Guerra is a 62 year old, Caucasian male with a past psychiatric history significant for insomnia, generalized anxiety disorder, panic disorder, and bipolar I disorder who presents to [facility] via virtual video visit for follow-up and medication management.  Patient is currently being managed on the following psychiatric medications:  Lurasidone 80 mg daily Escitalopram 20 mg daily Trazodone 150 mg bedtime  Patient reports that he has not been taking his lurasidone for roughly a month because his pharmacy will not fill the medication for him.  Patient is currently receiving his medications from the pharmacy [facility].  Despite not being able to receive his lurasidone, patient reports that he is doing okay.  Patient  denies depression stating that his symptoms have been manageable as of late.  Patient also states that his anxiety has been manageable.  He reports that he still continues to take his escitalopram and trazodone regularly.  A PHQ-9 screen was performed with the patient scoring an 11.  A GAD-7 screen was also performed with the patient scoring a 15.  Patient is alert and oriented x 4, calm, cooperative, and fully engaged in conversation during the encounter.  Patient describes his mood as optimistic.  Patient denies suicidal or homicidal ideations.  He further denies auditory or visual hallucinations and does not appear to be responding to internal/external stimuli.  Patient endorses good sleep and receives on average 8 hours of sleep per night.  Patient endorses good appetite and eats on average 3 meals per day.  Patient denies alcohol consumption or illicit drug use.  Patient endorses tobacco use in the form of snuff.  Visit Diagnosis:    ICD-10-CM   1. Bipolar I disorder, most recent episode depressed (HCC)  F31.30     2. Generalized anxiety disorder  F41.1 escitalopram (LEXAPRO) 20 MG tablet    3. Panic disorder  F41.0 escitalopram (LEXAPRO) 20 MG tablet    4. Insomnia due to other mental disorder  F51.05 traZODone (DESYREL) 150 MG tablet   F99       Past Psychiatric History:  Panic disorder Generalized anxiety disorder Insomnia Bipolar disorder  Past Medical History:  Past Medical History:  Diagnosis Date   Anxiety    Bilateral swelling of feet and ankles    Bipolar 1 disorder (HCC)    Bruised kidney 09/10/2016  Chronic lower back pain    Chronic pain    Daily headache    Diastolic dysfunction with chronic heart failure (HCC)    Fundic gland polyps of stomach, benign    GERD (gastroesophageal reflux disease)    Hepatitis C    "treated; got down to 0 load 6-8 years ago" (09/13/2016)   Hypertension    Insomnia    Memory difficulties    "in the last week or so" (09/13/2016)    Morbid obesity (HCC)    Personal history of colonic adenoma 01/20/2013   Recurrent falls    "recently" (09/13/2016)   Sleep apnea    "wore mask at night before; couldn't tolerate it" (09/13/2016)   Wears glasses     Past Surgical History:  Procedure Laterality Date   ANKLE FRACTURE SURGERY Bilateral 2001   APPENDECTOMY  1975   BIOPSY  11/11/2018   Procedure: BIOPSY;  Surgeon: Iva Boop, MD;  Location: [facility];  Service: Endoscopy;;   COLONOSCOPY     COLONOSCOPY WITH PROPOFOL N/A 11/11/2018   Procedure: COLONOSCOPY WITH PROPOFOL;  Surgeon: Iva Boop, MD;  Location: [facility];  Service: Endoscopy;  Laterality: N/A;   ESOPHAGOGASTRODUODENOSCOPY (EGD) WITH PROPOFOL N/A 11/11/2018   Procedure: ESOPHAGOGASTRODUODENOSCOPY (EGD) WITH PROPOFOL;  Surgeon: Iva Boop, MD;  Location: [facility];  Service: Endoscopy;  Laterality: N/A;   FOREARM SURGERY Left 2001   "chain saw injury"   POLYPECTOMY  11/11/2018   Procedure: POLYPECTOMY;  Surgeon: Iva Boop, MD;  Location: [facility];  Service: Endoscopy;;   TESTICLE TORSION REDUCTION  1980    Family Psychiatric History:  Patient denies a family history of psychiatric illness   Family History:  Family History  Problem Relation Age of Onset   Hypertension Father        old age--33   Other Father        enlarged prostate   Colon cancer Neg Hx     Social History:  Social History   Socioeconomic History   Marital status: Single    Spouse name: Not on file   Number of children: 0   Years of education: Not on file   Highest education level: Not on file  Occupational History   Occupation: unemployeed  Tobacco Use   Smoking status: Never   Smokeless tobacco: Current    Types: Snuff  Vaping Use   Vaping status: Never Used  Substance and Sexual Activity   Alcohol use: Not Currently    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, not now, and non-smoker   Social Determinants of Health   Financial Resource Strain: Not at Risk (07/09/2022)   Received from [place], [employer]    Financial Resource Strain: 1  Food Insecurity: At Risk (03/07/2023)   Received from [facility] Insecurity    Food: 2  Transportation Needs: Not at Risk (03/07/2023)   Received from [employer] Needs    Transportation: 1  Physical Activity:  Not on File (05/29/2022)   Received from [place], [place]   Physical Activity    Physical Activity: 0  Stress: Not on File (05/29/2022)   Received from [facility], [place]   Stress    Stress: 0  Social Connections: Not on File (01/01/2023)   Received from [facility]   Social Connections    Connectedness: 0    Allergies:  Allergies  Allergen Reactions   Codeine Itching    Metabolic Disorder Labs: Lab Results  Component Value Date   HGBA1C 5.5 03/03/2021   MPG 114 06/15/2016   MPG 108 09/23/2012   No results found for: "PROLACTIN" Lab Results  Component Value Date   CHOL 88 03/03/2021   TRIG 149 03/03/2021   HDL 16 (A) 03/03/2021   CHOLHDL 4.4 06/05/2018   VLDL 32 (H) 07/02/2016   LDLCALC 46 03/03/2021   LDLCALC 77 06/05/2018   Lab Results  Component Value Date   TSH 0.20 (A) 03/03/2021   TSH 1.970 06/05/2018    Therapeutic Level Labs: No results found for: "LITHIUM" No results found for: "VALPROATE" No results found for: "CBMZ"  Current Medications: Current Outpatient Medications  Medication Sig Dispense Refill   albuterol (VENTOLIN HFA) 108 (90 Base) MCG/ACT inhaler Inhale 1 puff into the lungs every 6 (six) hours as  needed for wheezing or shortness of breath. 1 Inhaler 0   atenolol (TENORMIN) 50 MG tablet Take 1 tablet (50 mg total) by mouth daily. 30 tablet 2   cyclobenzaprine (FLEXERIL) 10 MG tablet Take 10 mg by mouth 3 (three) times daily as needed for muscle spasms.     diphenhydrAMINE (BENADRYL) 25 MG tablet Take 50 mg by mouth every 8 (eight) hours as needed for itching or sleep.     escitalopram (LEXAPRO) 20 MG tablet Take 1 tablet (20 mg total) by mouth daily. 30 tablet 1   finasteride (PROSCAR) 5 MG tablet Take 5 mg by mouth daily.     hydrochlorothiazide (HYDRODIURIL) 25 MG tablet Take 1 tablet (25 mg total) by mouth daily.     lurasidone (LATUDA) 20 MG TABS tablet Take 1 tablet (20 mg total) by mouth daily with breakfast for 6 days, THEN 2 tablets (40 mg total) daily with breakfast for 6 days, THEN 4 tablets (80 mg total) daily with breakfast for 20 days. 98 tablet 0   minocycline (MINOCIN) 100 MG capsule Take 100 mg by mouth 3 (three) times daily.     oxyCODONE (ROXICODONE) 15 MG immediate release tablet Take 15 mg by mouth every 8 (eight) hours.     pantoprazole (PROTONIX) 40 MG tablet Take 1 tablet (40 mg total) by mouth 2 (two) times daily. 60 tablet 1   tamsulosin (FLOMAX) 0.4 MG CAPS capsule Take 1 capsule (0.4 mg total) by mouth daily at 2 PM. 30 capsule 6   traZODone (DESYREL) 150 MG tablet Take 1 tablet (150 mg total) by mouth at bedtime as needed for sleep. 30 tablet 1   No current facility-administered medications for this visit.     Musculoskeletal: Strength & Muscle Tone: Unable to assess due to telemedicine visit Gait & Station: Unable to assess due to telemedicine visit Patient leans: Unable to assess due to telemedicine visit  Psychiatric Specialty Exam: Review of Systems  Psychiatric/Behavioral:  Negative for decreased concentration, dysphoric mood, hallucinations, self-injury, sleep disturbance and suicidal ideas. The patient is nervous/anxious. The patient is not  hyperactive.     There were no vitals taken for this visit.There is no height  or weight on file to calculate BMI.  General Appearance: Fairly Groomed  Eye Contact:  Good  Speech:  Clear and Coherent and Normal Rate  Volume:  Normal  Mood:  Anxious and Depressed  Affect:  Appropriate  Thought Process:  Coherent, Goal Directed, and Descriptions of Associations: Intact  Orientation:  Full (Time, Place, and Person)  Thought Content: WDL   Suicidal Thoughts:  No  Homicidal Thoughts:  No  Memory:  Immediate;   Good Recent;   Good Remote;   Fair  Judgement:  Good  Insight:  Fair  Psychomotor Activity:  Normal  Concentration:  Concentration: Good and Attention Span: Good  Recall:  Good  Fund of Knowledge: Fair  Language: Good  Akathisia:  No  Handed:  Right  AIMS (if indicated): not done  Assets:  Communication Skills Desire for Improvement Housing Social Support  ADL's:  Impaired  Cognition: WNL  Sleep:  Good   Screenings: GAD-7    Flowsheet Row Video Visit from 01/02/2023 in [facility] Video Visit from 10/31/2022 in [facility] Video Visit from 08/29/2022 in [facility] Video Visit from 06/26/2022 in [facility] Video Visit from 05/11/2022 in [facility]  Total GAD-7 Score 15 12 18 11 20       PHQ2-9    Flowsheet Row Video Visit from 03/06/2023 in [facility] Video Visit from 01/02/2023 in [facility] Video Visit from 10/31/2022 in [facility] Video Visit from 08/29/2022 in [facility] Video Visit from 06/26/2022 in [facility]  PHQ-2 Total Score 2 3 4 4 3   PHQ-9 Total Score 11 14 15 16 13       Flowsheet Row Video Visit from 03/06/2023 in [facility] Video Visit from  01/02/2023 in [facility] Video Visit from 10/31/2022 in [facility]  C-SSRS RISK CATEGORY No Risk No Risk No Risk        Assessment and Plan:   Christian Guerra is a 62 year old, Caucasian male with a past psychiatric history significant for insomnia, generalized anxiety disorder, panic disorder, and bipolar I disorder who presents to [facility] via virtual video visit for follow-up and medication management.  Patient presents today encounter stating that his pharmacy will not prescribe him his lurasidone.  Patient reports that he has been without lurasidone for roughly a month.  Despite being without his medication, patient reports that he is doing fairly well and is taking his other medications as prescribed (escitalopram and trazodone).  Patient states that his depressive symptoms and anxiety have been manageable at this time.  Provider to provide patient with samples for Latuda.  Provider informed patient to take Latuda 20 mg for 6 days followed by 40 mg (half tablet of 80 mg) for 6 days, followed by 80 mg (full tablet) daily for mood stability.  Patient vocalized understanding.  Patient's other medications to be e-prescribed to pharmacy of choice.  Collaboration of Care: Collaboration of Care: Medication Management AEB provider managing patient's psychiatric medications and Psychiatrist AEB patient being followed by mental health provider  Patient/Guardian was advised Release of Information must be obtained prior to any record release in order to collaborate their care with an outside provider. Patient/Guardian was advised if they have not already done so to contact the registration  department to sign all necessary forms in order for [place] to release information regarding their care.   Consent: Patient/Guardian gives verbal consent for treatment and assignment of benefits for services provided during  this visit. Patient/Guardian expressed understanding and agreed to proceed.   1. Generalized anxiety disorder  - escitalopram (LEXAPRO) 20 MG tablet; Take 1 tablet (20 mg total) by mouth daily.  Dispense: 30 tablet; Refill: 1  2. Panic disorder  - escitalopram (LEXAPRO) 20 MG tablet; Take 1 tablet (20 mg total) by mouth daily.  Dispense: 30 tablet; Refill: 1  3. Insomnia due to other mental disorder  - traZODone (DESYREL) 150 MG tablet; Take 1 tablet (150 mg total) by mouth at bedtime as needed for sleep.  Dispense: 30 tablet; Refill: 1  4. Bipolar I disorder, most recent episode depressed (HCC) Patient to be provided samples of Latuda Patient was instructed to take Latuda 20 mg for 6 days, followed by Latuda 40 mg (80 mg half tablet) for 6 days, followed by 80 mg daily for mood stability  Patient to follow-up in 6 weeks Provider spent a total of 32 minutes with the patient/reviewing patient's chart  Meta Hatchet, PA 03/06/2023, 4:29 PM

## 2023-03-11 LAB — JAK2 (INCLUDING V617F AND EXON 12), MPL,& CALR-NEXT GEN SEQ

## 2023-03-11 LAB — BCR ABL1 FISH (GENPATH)

## 2023-03-13 ENCOUNTER — Ambulatory Visit (HOSPITAL_COMMUNITY)
Admission: RE | Admit: 2023-03-13 | Discharge: 2023-03-13 | Disposition: A | Discharge status: Home / Self Care | Payer: Medicaid Other | Source: Ambulatory Visit | Attending: Hematology | Admitting: Hematology

## 2023-03-13 DIAGNOSIS — R590 Localized enlarged lymph nodes: Secondary | ICD-10-CM | POA: Diagnosis present

## 2023-03-13 DIAGNOSIS — D75839 Thrombocytosis, unspecified: Secondary | ICD-10-CM | POA: Insufficient documentation

## 2023-03-13 DIAGNOSIS — D72829 Elevated white blood cell count, unspecified: Secondary | ICD-10-CM | POA: Diagnosis present

## 2023-03-13 MED ORDER — IOHEXOL 300 MG/ML  SOLN
100.0000 mL | Freq: Once | INTRAMUSCULAR | Status: AC | PRN
  Administered 2023-03-13: 100 mL via INTRAVENOUS

## 2023-04-01 ENCOUNTER — Inpatient Hospital Stay: Payer: Medicaid Other | Attending: Hematology | Admitting: Hematology

## 2023-04-01 DIAGNOSIS — D72829 Elevated white blood cell count, unspecified: Secondary | ICD-10-CM

## 2023-04-01 NOTE — Progress Notes (Signed)
HEMATOLOGY/ONCOLOGY TELE-MED VISIT NOTE  Date of Service: 04/01/2023  Patient Care Team: Dot Been, FNP as PCP - General (Family Medicine)  CHIEF COMPLAINTS/PURPOSE OF CONSULTATION:  Neutrophilic Leukocytosis  HISTORY OF PRESENTING ILLNESS:   Christian Guerra is a wonderful 62 y.o. male who has been referred to [place] by Dr. Sherron Flemings for evaluation and management of Neutrophilic Leukocytosis.   CBC from 07/09/2022 showed elevated WBC at 15.0 K, lab range from 3.4 to 10.8, elevated neutrophils of 12.5, lab range from 1.4 to 7.0. CBC from 10/29/2022 showed elevated WBC at 14.1 K with elevated neutrophils of 9.5. CBC from 01/17/2023 shpwed elevated WBC of 19.0 K, elevated platelets of 455 K, and elevated neutrophils of 16.1. CBC from 01/29/2023 showed elevated WBC at 19.5 K, elevated platelets of 498 K, and elevated neutrophils of 16.5. WBC, platelets, and neutrophils have been elevated for at least a year.   Patient is accompanied by his brother during this visit. Patient notes that the first time he was told of elevated WBC, platelets, and neutrophils was around one year ago.   Patient notes he has pinched nerves which has been causing him back pain and hip pain. He also complains of loss of sensation on his legs, which affects his walking.   Patient notes he takes Albuterol as needed.  He has past medical history of sleep apnea and he used to use Cpap machine. He currently does not use Cpap machine because he lost the machine.   Patient notes he has past medical history of cystic acne throughout his back.   Patient notes he has fungal/yeast infection around his groin area and bilateral armpit, he uses a anti-fungal ointment everyday. He notes he currently showers every 2-3 days.   He complains of chronic occasional right groin pain. He denies any past medical history of prostate issues or urine issues.   Patient complains of fatigue and lethargy for the past year. Patient  notes he has also been losing weight due to weight loss medication.   He denies any new infection issues, fever, chills, night sweats, chest pain, abdominal pain, or new lump/bump. He denies any dental problems.  He denies any medical history of blood clots. He does have congestive heart failure and follows-up with his PCP. He has never been to a cardiologist.   Patient was treated for Hepatitis-C around 2000.  INTERVAL HISTORY: Christian Guerra is a wonderful 62 y.o. male who is connected via phone for evaluation and management of Neutrophilic Leukocytosis.   .I connected with [name] on 04/01/2023 at  8:40 AM EST by telephone visit and verified that I am speaking with the correct person using two identifiers.   Patient notes he has been doing well overall since our last visit. He denies any new infection issues, fever, chills, night sweats, unexpected weight loss, abdominal pain, chest pain, back pain, or leg swelling.   He does complain of insomnia since he has not been using CPAP machine.   Discussed lab results and CT results from 03/04/2023 and 03/13/2023 in detail with the patient.    I discussed the limitations, risks, security and privacy concerns of performing an evaluation and management service by telemedicine and the availability of in-person appointments. I also discussed with the patient that there may be a patient responsible charge related to this service. The patient expressed understanding and agreed to proceed.   Other persons participating in the visit and their role in the encounter: None  Patient's location: Home  Provider's location: [facility]   Chief Complaint: Neutrophilic Leukocytosis    MEDICAL HISTORY:  Past Medical History:  Diagnosis Date   Anxiety    Bilateral swelling of feet and ankles    Bipolar 1 disorder (HCC)    Bruised kidney 09/10/2016   Chronic lower back pain    Chronic pain    Daily headache    Diastolic dysfunction with  chronic heart failure (HCC)    Fundic gland polyps of stomach, benign    GERD (gastroesophageal reflux disease)    Hepatitis C    "treated; got down to 0 load 6-8 years ago" (09/13/2016)   Hypertension    Insomnia    Memory difficulties    "in the last week or so" (09/13/2016)   Morbid obesity (HCC)    Personal history of colonic adenoma 01/20/2013   Recurrent falls    "recently" (09/13/2016)   Sleep apnea    "wore mask at night before; couldn't tolerate it" (09/13/2016)   Wears glasses     SURGICAL HISTORY: Past Surgical History:  Procedure Laterality Date   ANKLE FRACTURE SURGERY Bilateral 2001   APPENDECTOMY  1975   BIOPSY  11/11/2018   Procedure: BIOPSY;  Surgeon: Iva Boop, MD;  Location: [facility];  Service: Endoscopy;;   COLONOSCOPY     COLONOSCOPY WITH PROPOFOL N/A 11/11/2018   Procedure: COLONOSCOPY WITH PROPOFOL;  Surgeon: Iva Boop, MD;  Location: [facility];  Service: Endoscopy;  Laterality: N/A;   ESOPHAGOGASTRODUODENOSCOPY (EGD) WITH PROPOFOL N/A 11/11/2018   Procedure: ESOPHAGOGASTRODUODENOSCOPY (EGD) WITH PROPOFOL;  Surgeon: Iva Boop, MD;  Location: [facility];  Service: Endoscopy;  Laterality: N/A;   FOREARM SURGERY Left 2001   "chain saw injury"   POLYPECTOMY  11/11/2018   Procedure: POLYPECTOMY;  Surgeon: Iva Boop, MD;  Location: [facility];  Service: Endoscopy;;   TESTICLE TORSION REDUCTION  1980    SOCIAL HISTORY: Social History   Socioeconomic History   Marital status: Single    Spouse name: Not on file   Number of children: 0   Years of education: Not on file   Highest education level: Not on file  Occupational History   Occupation: unemployeed  Tobacco Use   Smoking status: Never   Smokeless tobacco: Current    Types: Snuff  Vaping Use   Vaping status: Never Used  Substance and Sexual Activity   Alcohol use: Not Currently    Comment: 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, not now, and non-smoker   Social Determinants of Health   Financial Resource Strain: Not at Risk (07/09/2022)   Received from [place] [employer]    Financial Resource Strain: 1  Food Insecurity: At Risk (03/07/2023)   Received from [facility] Insecurity    Food: 2  Transportation Needs: Not at Risk (03/07/2023)   Received from [employer] Needs    Transportation: 1  Physical Activity: Not on File (05/29/2022)   Received from  [facility], [place]   Physical Activity    Physical Activity: 0  Stress: Not on File (05/29/2022)   Received from [facility], [place]   Stress    Stress: 0  Social Connections: Not on File (01/01/2023)   Received from [facility]   Social Connections    Connectedness: 0  Intimate Partner Violence: Not At Risk (08/30/2020)   Humiliation, Afraid, Rape, and Kick questionnaire    Fear of Current or Ex-Partner: No    Emotionally Abused: No    Physically Abused: No    Sexually Abused: No    FAMILY HISTORY: Family History  Problem Relation Age of Onset   Hypertension Father        old age--39   Other Father        enlarged prostate   Colon cancer Neg Hx     ALLERGIES:  is allergic to codeine.  MEDICATIONS:  Current Outpatient Medications  Medication Sig Dispense Refill   albuterol (VENTOLIN HFA) 108 (90 Base) MCG/ACT inhaler Inhale 1 puff into the lungs every 6 (six) hours as needed for wheezing or shortness of breath. 1 Inhaler 0   atenolol (TENORMIN) 50 MG tablet Take 1 tablet (50 mg total) by mouth daily. 30 tablet 2   cyclobenzaprine (FLEXERIL) 10 MG tablet Take 10 mg by  mouth 3 (three) times daily as needed for muscle spasms.     diphenhydrAMINE (BENADRYL) 25 MG tablet Take 50 mg by mouth every 8 (eight) hours as needed for itching or sleep.     escitalopram (LEXAPRO) 20 MG tablet Take 1 tablet (20 mg total) by mouth daily. 30 tablet 1   finasteride (PROSCAR) 5 MG tablet Take 5 mg by mouth daily.     hydrochlorothiazide (HYDRODIURIL) 25 MG tablet Take 1 tablet (25 mg total) by mouth daily.     lurasidone (LATUDA) 20 MG TABS tablet Take 1 tablet (20 mg total) by mouth daily with breakfast for 6 days, THEN 2 tablets (40 mg total) daily with breakfast for 6 days, THEN 4 tablets (80 mg total) daily with breakfast for 20 days. 98 tablet 0   minocycline (MINOCIN) 100 MG capsule Take 100 mg by mouth 3 (three) times daily.     oxyCODONE (ROXICODONE) 15 MG immediate release tablet Take 15 mg by mouth every 8 (eight) hours.     pantoprazole (PROTONIX) 40 MG tablet Take 1 tablet (40 mg total) by mouth 2 (two) times daily. 60 tablet 1   tamsulosin (FLOMAX) 0.4 MG CAPS capsule Take 1 capsule (0.4 mg total) by mouth daily at 2 PM. 30 capsule 6   traZODone (DESYREL) 150 MG tablet Take 1 tablet (150 mg total) by mouth at bedtime as needed for sleep. 30 tablet 1   No current facility-administered medications for this visit.    REVIEW OF SYSTEMS:    10 Point review of Systems was done is negative except as noted above.  PHYSICAL EXAMINATION: TELE-MED VISIT  LABORATORY DATA:  I have reviewed the data as listed  .    Latest Ref Rng & Units 03/04/2023    3:37 PM 05/03/2022    4:59 AM 05/02/2022    4:27 AM  CBC  WBC 4.0 - 10.5 K/uL 15.6  8.2  11.1   Hemoglobin 13.0 - 17.0 g/dL 96.2  95.2  84.1   Hematocrit 39.0 - 52.0 % 39.1  39.5  39.7   Platelets 150 - 400 K/uL 423  212  237     .  Latest Ref Rng & Units 03/04/2023    3:37 PM 05/03/2022    4:59 AM 05/02/2022    4:27 AM  CMP  Glucose 70 - 99 mg/dL 213  94  086   BUN 8 - 23 mg/dL 10  26  28    Creatinine 0.61 -  1.24 mg/dL 5.78  4.69  6.29   Sodium 135 - 145 mmol/L 140  134  132   Potassium 3.5 - 5.1 mmol/L 3.8  4.2  4.3   Chloride 98 - 111 mmol/L 94  94  93   CO2 22 - 32 mmol/L 36  32  31   Calcium 8.9 - 10.3 mg/dL 9.5  8.1  8.2   Total Protein 6.5 - 8.1 g/dL 7.9   5.6   Total Bilirubin <1.2 mg/dL 0.5   1.1   Alkaline Phos 38 - 126 U/L 88   179   AST 15 - 41 U/L 10   40   ALT 0 - 44 U/L 11   49         RADIOGRAPHIC STUDIES: I have personally reviewed the radiological images as listed and agreed with the findings in the report. CT CHEST ABDOMEN PELVIS W CONTRAST  Result Date: 03/13/2023 CLINICAL DATA:  Lymphadenopathy, chest or axilla, concern for lymphoma. * Tracking Code: BO *. EXAM: CT CHEST, ABDOMEN, AND PELVIS WITH CONTRAST TECHNIQUE: Multidetector CT imaging of the chest, abdomen and pelvis was performed following the standard protocol during bolus administration of intravenous contrast. RADIATION DOSE REDUCTION: This exam was performed according to the departmental dose-optimization program which includes automated exposure control, adjustment of the mA and/or kV according to patient size and/or use of iterative reconstruction technique. CONTRAST:  OMNIPAQUE IOHEXOL 300 MG/ML  SOLN COMPARISON:  Multiple priors including CT April 26, 2021 MRI November 01, 2020 FINDINGS: CT CHEST FINDINGS Cardiovascular: Aortic atherosclerosis. Normal caliber thoracic aorta. No central pulmonary embolus on this nondedicated study. Normal size heart. Lipomatous hypertrophy of the intra-atrial septum. No significant pericardial effusion/thickening. Mediastinum/Nodes: No suspicious thyroid nodule. No pathologically enlarged mediastinal, hilar or axillary lymph nodes. The esophagus is grossly unremarkable. Lungs/Pleura: No suspicious pulmonary nodules or masses. No pleural effusion. No pneumothorax. Musculoskeletal: Gynecomastia. No aggressive lytic or blastic lesion of bone. Thoracic diffuse idiopathic skeletal  hyperostosis. CT ABDOMEN PELVIS FINDINGS Hepatobiliary: Diffuse hepatic steatosis. No suspicious hepatic lesion. Cholelithiasis without findings of acute cholecystitis. No biliary ductal dilation. Pancreas: No pancreatic ductal dilation or evidence of acute inflammation. Spleen: No splenomegaly. Adrenals/Urinary Tract: Bilateral adrenal glands appear normal. No hydronephrosis. Kidneys demonstrate symmetric enhancement. Urinary bladder is unremarkable for degree of distension. Stomach/Bowel: No radiopaque enteric contrast material was administered. Stomach is minimally distended limiting evaluation. No pathologic dilation of small or large bowel. No evidence of acute bowel inflammation. Vascular/Lymphatic: Normal caliber abdominal aorta. Smooth IVC contours. Scattered aortic atherosclerosis. The portal, splenic and superior mesenteric veins are patent. No pathologically enlarged abdominal or pelvic lymph nodes. Reproductive: Prostate is unremarkable. Other: Fat containing left inguinal hernia. Musculoskeletal: No aggressive lytic or blastic lesion of bone. Multilevel degenerative change of the spine with vacuum disc artifact. IMPRESSION: 1. No pathologically enlarged lymph nodes within the chest, abdomen or pelvis. 2. No splenomegaly. 3. Diffuse hepatic steatosis. 4. Cholelithiasis without findings of acute cholecystitis. 5. Fat containing left inguinal hernia. 6.  Aortic Atherosclerosis (ICD10-I70.0). Electronically Signed   By: Maudry Mayhew M.D.   On: 03/13/2023 13:18    ASSESSMENT & PLAN:   62 yo with  1) Persistent Neutrophilic leucocytosis ? Clonal vs reactive. 2) Axillary LNadenopathy with significantly elevated sed rate ?Hodgkins lymphoma  PLAN: -Discussed lab results from 03/04/2023 in detail with the patient. CBC shows elevated WBC of 15.6 K with elevated neutrophils at 13.4 K and slightly elevated platelets of 423 K.CMP shows slightly decreased AST of 10. LDH of 146. Elevated sedimentation rate  of 76.  -Discussed JAK-2 mutation results from 03/04/2023. Did not show JAK-2 mutation.  -Discussed bcr/abl mutation results. Did not show Bcr/Abl mutation.  -Discussed CT chest/abdomen/pelvis results from 03/13/2023 in detail with the patient. Showed No pathologically enlarged lymph nodes within the chest, abdomen or pelvis. No splenomegaly. Diffuse hepatic steatosis. -No signs of bone marrow disorder.  -Discussed that there is something causing chronic inflammation which could be causing reactive neutrophilic Leukocytosis. -Continue to follow-up with rheumatology regarding potential auto-immune disease.  -Recommend to use CPAP machine. -Continue to follow-up with PCP regarding age-related cancer screening. -Continue to follow-up with PCP regarding Neutrophilic Leukocytosis. Follow-up with [place] if WBC exceeds 25 K or other lab abnormalities.  -Discussed with the patient that if neutrophilic Leukocytosis worsens, the next step is bone marrow biopsy. -Answered patient's questions.    . No orders of the defined types were placed in this encounter.   FOLLOW-UP: RTC with PCP  The total time spent in the appointment was 21 minutes* .  All of the patient's questions were answered with apparent satisfaction. The patient knows to call the clinic with any problems, questions or concerns.   Wyvonnia Lora MD MS AAHIVMS [facility] Hematology/Oncology Physician [facility]  .*Total Encounter Time as defined by the Centers for Medicare and Medicaid Services includes, in addition to the face-to-face time of a patient visit (documented in the note above) non-face-to-face time: obtaining and reviewing outside history, ordering and reviewing medications, tests or procedures, care coordination (communications with other health care professionals or caregivers) and documentation in the medical record.   I,Param Shah,acting as a Neurosurgeon for Wyvonnia Lora, MD.,have documented all relevant documentation  on the behalf of Wyvonnia Lora, MD,as directed by  Wyvonnia Lora, MD while in the presence of Wyvonnia Lora, MD.  .I have reviewed the above documentation for accuracy and completeness, and I agree with the above. Johney Maine MD

## 2023-05-01 ENCOUNTER — Telehealth (HOSPITAL_COMMUNITY): Payer: Medicaid Other | Admitting: Physician Assistant

## 2023-05-01 ENCOUNTER — Encounter (HOSPITAL_COMMUNITY): Payer: Self-pay

## 2023-08-29 ENCOUNTER — Emergency Department (HOSPITAL_COMMUNITY)

## 2023-08-29 ENCOUNTER — Inpatient Hospital Stay (HOSPITAL_COMMUNITY)
Admission: EM | Admit: 2023-08-29 | Discharge: 2023-08-31 | DRG: 603 | Disposition: A | Discharge status: Home Health | Attending: Internal Medicine | Admitting: Internal Medicine

## 2023-08-29 DIAGNOSIS — Z87891 Personal history of nicotine dependence: Secondary | ICD-10-CM

## 2023-08-29 DIAGNOSIS — G4733 Obstructive sleep apnea (adult) (pediatric): Secondary | ICD-10-CM | POA: Diagnosis present

## 2023-08-29 DIAGNOSIS — F411 Generalized anxiety disorder: Secondary | ICD-10-CM | POA: Diagnosis present

## 2023-08-29 DIAGNOSIS — B372 Candidiasis of skin and nail: Secondary | ICD-10-CM | POA: Diagnosis present

## 2023-08-29 DIAGNOSIS — Z6841 Body Mass Index (BMI) 40.0 and over, adult: Secondary | ICD-10-CM

## 2023-08-29 DIAGNOSIS — Z885 Allergy status to narcotic agent status: Secondary | ICD-10-CM

## 2023-08-29 DIAGNOSIS — K7581 Nonalcoholic steatohepatitis (NASH): Secondary | ICD-10-CM | POA: Diagnosis present

## 2023-08-29 DIAGNOSIS — E876 Hypokalemia: Secondary | ICD-10-CM | POA: Diagnosis present

## 2023-08-29 DIAGNOSIS — B369 Superficial mycosis, unspecified: Secondary | ICD-10-CM

## 2023-08-29 DIAGNOSIS — G4700 Insomnia, unspecified: Secondary | ICD-10-CM | POA: Diagnosis present

## 2023-08-29 DIAGNOSIS — L03311 Cellulitis of abdominal wall: Secondary | ICD-10-CM | POA: Diagnosis not present

## 2023-08-29 DIAGNOSIS — F319 Bipolar disorder, unspecified: Secondary | ICD-10-CM | POA: Diagnosis present

## 2023-08-29 DIAGNOSIS — Z8249 Family history of ischemic heart disease and other diseases of the circulatory system: Secondary | ICD-10-CM

## 2023-08-29 DIAGNOSIS — R3589 Other polyuria: Secondary | ICD-10-CM

## 2023-08-29 DIAGNOSIS — D649 Anemia, unspecified: Secondary | ICD-10-CM | POA: Diagnosis present

## 2023-08-29 DIAGNOSIS — Z79899 Other long term (current) drug therapy: Secondary | ICD-10-CM

## 2023-08-29 DIAGNOSIS — B182 Chronic viral hepatitis C: Secondary | ICD-10-CM | POA: Diagnosis present

## 2023-08-29 DIAGNOSIS — I5032 Chronic diastolic (congestive) heart failure: Secondary | ICD-10-CM | POA: Diagnosis not present

## 2023-08-29 DIAGNOSIS — I110 Hypertensive heart disease with heart failure: Secondary | ICD-10-CM | POA: Diagnosis present

## 2023-08-29 DIAGNOSIS — I10 Essential (primary) hypertension: Secondary | ICD-10-CM | POA: Diagnosis present

## 2023-08-29 DIAGNOSIS — K219 Gastro-esophageal reflux disease without esophagitis: Secondary | ICD-10-CM | POA: Diagnosis present

## 2023-08-29 DIAGNOSIS — Z862 Personal history of diseases of the blood and blood-forming organs and certain disorders involving the immune mechanism: Secondary | ICD-10-CM

## 2023-08-29 LAB — URINALYSIS, W/ REFLEX TO CULTURE (INFECTION SUSPECTED)
Bacteria, UA: NONE SEEN
Bilirubin Urine: NEGATIVE
Glucose, UA: NEGATIVE mg/dL
Hgb urine dipstick: NEGATIVE
Ketones, ur: NEGATIVE mg/dL
Leukocytes,Ua: NEGATIVE
Nitrite: NEGATIVE
Protein, ur: NEGATIVE mg/dL
Specific Gravity, Urine: 1.009 (ref 1.005–1.030)
pH: 8 (ref 5.0–8.0)

## 2023-08-29 LAB — COMPREHENSIVE METABOLIC PANEL WITH GFR
ALT: 11 U/L (ref 0–44)
AST: 16 U/L (ref 15–41)
Albumin: 2.8 g/dL — ABNORMAL LOW (ref 3.5–5.0)
Alkaline Phosphatase: 94 U/L (ref 38–126)
Anion gap: 11 (ref 5–15)
BUN: 9 mg/dL (ref 8–23)
CO2: 31 mmol/L (ref 22–32)
Calcium: 8.7 mg/dL — ABNORMAL LOW (ref 8.9–10.3)
Chloride: 91 mmol/L — ABNORMAL LOW (ref 98–111)
Creatinine, Ser: 0.96 mg/dL (ref 0.61–1.24)
GFR, Estimated: >60 mL/min (ref >60)
Glucose, Bld: 94 mg/dL (ref 70–99)
Potassium: 3.2 mmol/L — ABNORMAL LOW (ref 3.5–5.1)
Sodium: 133 mmol/L — ABNORMAL LOW (ref 135–145)
Total Bilirubin: 0.6 mg/dL (ref 0.0–1.2)
Total Protein: 7.6 g/dL (ref 6.5–8.1)

## 2023-08-29 LAB — CBC WITH DIFFERENTIAL/PLATELET
Abs Immature Granulocytes: 0.26 10*3/uL — ABNORMAL HIGH (ref 0.00–0.07)
Basophils Absolute: 0.1 10*3/uL (ref 0.0–0.1)
Basophils Relative: 0 %
Eosinophils Absolute: 0 10*3/uL (ref 0.0–0.5)
Eosinophils Relative: 0 %
HCT: 38.5 % — ABNORMAL LOW (ref 39.0–52.0)
Hemoglobin: 12 g/dL — ABNORMAL LOW (ref 13.0–17.0)
Immature Granulocytes: 2 %
Lymphocytes Relative: 16 %
Lymphs Abs: 2.5 10*3/uL (ref 0.7–4.0)
MCH: 25.9 pg — ABNORMAL LOW (ref 26.0–34.0)
MCHC: 31.2 g/dL (ref 30.0–36.0)
MCV: 83.2 fL (ref 80.0–100.0)
Monocytes Absolute: 0.7 10*3/uL (ref 0.1–1.0)
Monocytes Relative: 5 %
Neutro Abs: 12.2 10*3/uL — ABNORMAL HIGH (ref 1.7–7.7)
Neutrophils Relative %: 77 %
Platelets: 493 10*3/uL — ABNORMAL HIGH (ref 150–400)
RBC: 4.63 MIL/uL (ref 4.22–5.81)
RDW: 16.1 % — ABNORMAL HIGH (ref 11.5–15.5)
WBC: 15.7 10*3/uL — ABNORMAL HIGH (ref 4.0–10.5)
nRBC: 0 % (ref 0.0–0.2)

## 2023-08-29 LAB — PROTIME-INR
INR: 1.1 (ref 0.8–1.2)
Prothrombin Time: 14.3 s (ref 11.4–15.2)

## 2023-08-29 LAB — I-STAT CG4 LACTIC ACID, ED: Lactic Acid, Venous: 1 mmol/L (ref 0.5–1.9)

## 2023-08-29 MED ORDER — VANCOMYCIN HCL IN DEXTROSE 1-5 GM/200ML-% IV SOLN
1000.0000 mg | INTRAVENOUS | Status: AC
  Administered 2023-08-29: 1000 mg via INTRAVENOUS
  Filled 2023-08-29: qty 200

## 2023-08-29 NOTE — ED Provider Notes (Signed)
 [place] EMERGENCY DEPARTMENT AT [facility] Provider Note   CSN: [id number] Arrival date & time: 08/29/23  1836     History  No chief complaint on file.   Christian Guerra Age is a 63 y.o. male.  HPI      63 year old male with a history of bipolar disorder, diastolic heart failure, OSA, chronic pain, ongoing abdominal wall cellulitis for which she has been seeing his primary care doctor, presents with concern for abdominal wall cellulitis that has not responded to oral medications.  Reports that he has been having at least 3 to 4 weeks of erythema of his abdomen.  He was given clindamycin 300 mg P03 times a day at his previous visit with his PCP, called wound care for follow-up but was scheduled for June 6.  He saw his PCP today who evaluated him and recommended he come to the emergency department for further care and IV antibiotics  He has also been using triple antibiotic ointment.  He previously was taken nystatin  triamcinolone  cream with concern for fungal infection  He denies fever, nausea, vomiting, abdominal pain Past Medical History:  Diagnosis Date   Anxiety    Bilateral swelling of feet and ankles    Bipolar 1 disorder (HCC)    Bruised kidney 09/10/2016   Chronic lower back pain    Chronic pain    Daily headache    Diastolic dysfunction with chronic heart failure (HCC)    Fundic gland polyps of stomach, benign    GERD (gastroesophageal reflux disease)    Hepatitis C    "treated; got down to 0 load 6-8 years ago" (09/13/2016)   Hypertension    Insomnia    Memory difficulties    "in the last week or so" (09/13/2016)   Morbid obesity (HCC)    Personal history of colonic adenoma 01/20/2013   Recurrent falls    "recently" (09/13/2016)   Sleep apnea    "wore mask at night before; couldn't tolerate it" (09/13/2016)   Wears glasses      Home Medications Prior to Admission medications   Medication Sig Start Date End Date Taking? Authorizing Provider   albuterol  (VENTOLIN  HFA) 108 (90 Base) MCG/ACT inhaler Inhale 1 puff into the lungs every 6 (six) hours as needed for wheezing or shortness of breath. 08/19/18   Lawrance Presume, MD  atenolol  (TENORMIN ) 50 MG tablet Take 1 tablet (50 mg total) by mouth daily. 03/25/17   Carin Charleston, FNP  cyclobenzaprine  (FLEXERIL ) 10 MG tablet Take 10 mg by mouth 3 (three) times daily as needed for muscle spasms.    [provider]  diphenhydrAMINE (BENADRYL) 25 MG tablet Take 50 mg by mouth every 8 (eight) hours as needed for itching or sleep.    [provider]  escitalopram  (LEXAPRO ) 20 MG tablet Take 1 tablet (20 mg total) by mouth daily. 03/09/23 03/08/24  Nwoko, Uchenna E, PA  finasteride  (PROSCAR ) 5 MG tablet Take 5 mg by mouth daily. 11/17/21   [provider]  hydrochlorothiazide  (HYDRODIURIL ) 25 MG tablet Take 1 tablet (25 mg total) by mouth daily. 05/09/22   Vann, Jessica U, DO  lurasidone  (LATUDA ) 20 MG TABS tablet Take 1 tablet (20 mg total) by mouth daily with breakfast for 6 days, THEN 2 tablets (40 mg total) daily with breakfast for 6 days, THEN 4 tablets (80 mg total) daily with breakfast for 20 days. 01/02/23 03/04/23  Nwoko, Uchenna E, PA  minocycline  (MINOCIN ) 100 MG capsule Take  100 mg by mouth 3 (three) times daily. 10/04/18   [provider]  oxyCODONE  (ROXICODONE ) 15 MG immediate release tablet Take 15 mg by mouth every 8 (eight) hours.    [provider]  pantoprazole  (PROTONIX ) 40 MG tablet Take 1 tablet (40 mg total) by mouth 2 (two) times daily. 05/03/22   Vann, Jessica U, DO  tamsulosin  (FLOMAX ) 0.4 MG CAPS capsule Take 1 capsule (0.4 mg total) by mouth daily at 2 PM. 12/11/18   Lawrance Presume, MD  traZODone  (DESYREL ) 150 MG tablet Take 1 tablet (150 mg total) by mouth at bedtime as needed for sleep. 03/09/23   Nwoko, Uchenna E, PA      Allergies    Codeine    Review of Systems   Review of Systems  Physical Exam Updated Vital  Signs BP (!) 134/41   Pulse 94   Temp 98.3 F (36.8 C) (Oral)   Resp 20   SpO2 100%  Physical Exam Vitals and nursing note reviewed.  Constitutional:      General: He is not in acute distress.    Appearance: He is well-developed. He is not diaphoretic.  HENT:     Head: Normocephalic and atraumatic.  Eyes:     Conjunctiva/sclera: Conjunctivae normal.  Cardiovascular:     Rate and Rhythm: Normal rate and regular rhythm.     Heart sounds: Normal heart sounds. No murmur heard.    No friction rub. No gallop.  Pulmonary:     Effort: Pulmonary effort is normal. No respiratory distress.     Breath sounds: Normal breath sounds. No wheezing or rales.  Abdominal:     General: There is no distension.     Palpations: Abdomen is soft.     Tenderness: There is no abdominal tenderness. There is no guarding.  Musculoskeletal:     Cervical back: Normal range of motion.  Skin:    General: Skin is warm and dry.     Findings: Erythema, lesion (multiple ulcerations) and rash present.  Neurological:     Mental Status: He is alert and oriented to person, place, and time.          ED Results / Procedures / Treatments   Labs (all labs ordered are listed, but only abnormal results are displayed) Labs Reviewed  COMPREHENSIVE METABOLIC PANEL WITH GFR - Abnormal; Notable for the following components:      Result Value   Sodium 133 (*)    Potassium 3.2 (*)    Chloride 91 (*)    Calcium 8.7 (*)    Albumin 2.8 (*)    All other components within normal limits  CBC WITH DIFFERENTIAL/PLATELET - Abnormal; Notable for the following components:   WBC 15.7 (*)    Hemoglobin 12.0 (*)    HCT 38.5 (*)    MCH 25.9 (*)    RDW 16.1 (*)    Platelets 493 (*)    Neutro Abs 12.2 (*)    Abs Immature Granulocytes 0.26 (*)    All other components within normal limits  CULTURE, BLOOD (ROUTINE X 2)  CULTURE, BLOOD (ROUTINE X 2)  PROTIME-INR  URINALYSIS, W/ REFLEX TO CULTURE (INFECTION SUSPECTED)  HIV  ANTIBODY (ROUTINE TESTING W REFLEX)  CBC  COMPREHENSIVE METABOLIC PANEL WITH GFR  I-STAT CG4 LACTIC ACID, ED  I-STAT CG4 LACTIC ACID, ED    EKG EKG Interpretation Date/Time:  Thursday Aug 29 2023 20:33:03 EDT Ventricular Rate:  87 PR Interval:  67 QRS Duration:  87  QT Interval:  363 QTC Calculation: 437 R Axis:   53  Text Interpretation: Sinus rhythm Short PR interval No significant change since last tracing Confirmed by Scarlette Currier ([id number]) on 08/30/2023 12:49:21 AM  Radiology DG Chest Port 1 View Result Date: 08/29/2023 CLINICAL DATA:  Questionable sepsis EXAM: PORTABLE CHEST 1 VIEW COMPARISON:  05/24/2017 FINDINGS: Heart and mediastinal contours are within normal limits. No focal opacities or effusions. No acute bony abnormality. IMPRESSION: No active disease. Electronically Signed   By: Janeece Mechanic M.D.   On: 08/29/2023 20:34    Procedures Procedures    Medications Ordered in ED Medications  vancomycin  (VANCOCIN ) IVPB 1000 mg/200 mL premix (1,000 mg Intravenous New Bag/Given 08/29/23 2357)  tamsulosin  (FLOMAX ) capsule 0.4 mg (has no administration in time range)  pantoprazole  (PROTONIX ) EC tablet 40 mg (has no administration in time range)  enoxaparin  (LOVENOX ) injection 40 mg (has no administration in time range)  fluconazole (DIFLUCAN) IVPB 200 mg (has no administration in time range)  sodium chloride  flush (NS) 0.9 % injection 3 mL (has no administration in time range)  sodium chloride  flush (NS) 0.9 % injection 3 mL (has no administration in time range)  sodium chloride  flush (NS) 0.9 % injection 3 mL (has no administration in time range)  0.9 %  sodium chloride  infusion (has no administration in time range)  acetaminophen  (TYLENOL ) tablet 650 mg (has no administration in time range)    Or  acetaminophen  (TYLENOL ) suppository 650 mg (has no administration in time range)  ondansetron  (ZOFRAN ) tablet 4 mg (has no administration in time range)    Or  ondansetron   (ZOFRAN ) injection 4 mg (has no administration in time range)  albuterol  (PROVENTIL ) (2.5 MG/3ML) 0.083% nebulizer solution 2.5 mg (has no administration in time range)  potassium chloride  SA (KLOR-CON  M) CR tablet 20 mEq (has no administration in time range)  ceFEPIme (MAXIPIME) 2 g in sodium chloride  0.9 % 100 mL IVPB (has no administration in time range)    ED Course/ Medical Decision Making/ A&P                                   63 year old male with a history of bipolar disorder, diastolic heart failure, OSA, chronic pain, ongoing abdominal wall cellulitis for which she has been seeing his primary care doctor, presents with concern for abdominal wall cellulitis that has not responded to oral medications.  Clinically have low suspicion for underlying abscess, necrotizing fasciitis.  Some areas do appear to be fungal etiology with more chronic wounds, but there is an area that looks more significantly consistent with cellulitis which has not responded to oral antibiotics.  Chest x-ray obtained and personally abided interpreted by me shows no evidence of pneumonia or edema.  Labs completed and personally evaluated interpreted by me show leukocytosis.  He has known leukocytosis for which she is seeing hematology however may be elevated at this time secondary to infection.  He has chronic anemia which is unchanged at 12.  No clinically significant electrolyte abnormalities-mild hypok.   Given presence of cellulitis with failure of oral oral antibiotics, will admit for IV antibiotics, and in discussion with hospitalist will include IV fungal.         Final Clinical Impression(s) / ED Diagnoses Final diagnoses:  Cellulitis of abdominal wall    Rx / DC Orders ED Discharge Orders     None  Scarlette Currier, MD 08/30/23 [phone]

## 2023-08-29 NOTE — ED Provider Triage Note (Signed)
 Emergency Medicine Provider Triage Evaluation Note  Kyndell Heward , a 63 y.o. male  was evaluated in triage.  Pt complains of 5 wk of abd wall cellulitis, failed OP tx and has Uti like sytmpoms. Reports chills.  Review of Systems  Positive: Abd pain Negative: fever  Physical Exam  BP (!) 98/57 (BP Location: Left Arm)   Pulse 92   Temp 98.2 F (36.8 C) (Oral)   Resp 20   SpO2 100%  Gen:   Awake, no distress   Resp:  Normal effort  MSK:   Moves extremities without difficulty  Other:  Cellulitis of low abd wall  Medical Decision Making  Medically screening exam initiated at 7:39 PM.  Appropriate orders placed.  Jarvin Cardona Burrous was informed that the remainder of the evaluation will be completed by another provider, this initial triage assessment does not replace that evaluation, and the importance of remaining in the ED until their evaluation is complete.     Eudora Heron, PA-C 08/29/23 1940

## 2023-08-29 NOTE — ED Triage Notes (Signed)
 Patient complains of redness and sores around his abdomen under his skin folds. Patient states that his PCP had been treating him for fungal infection to abdomen area and states wounds have no developed and it is getting worse per patient. Patient had been going to his PCP weekly for skin and wound treatment. Patient has just finished up oral antibiotics.

## 2023-08-29 NOTE — H&P (Incomplete)
 History and Physical    Christian Guerra [facility] ZOX:[id number] DOB: [birth year] DOA: 08/29/2023  PCP: Christian Lindau, FNP   Patient coming from: Home   Chief Complaint: No chief complaint on file.  ED TRIAGE note:  Patient complains of redness and sores around his abdomen under his skin folds. Patient states that his PCP had been treating him for fungal infection to abdomen area and states wounds have no developed and it is getting worse per patient. Patient had been going to his PCP weekly for skin and wound treatment. Patient has just finished up oral antibiotics.       HPI:  Christian Guerra is a 63 y.o. 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 At outpatient patient has been treated with oral clindamycin and topical fungal ointment without any improvement.   ED Course:  At presentation to ED patient found hypotensive blood pressure was 98/57 otherwise hemodynamically stable. CMP showing low sodium 133, low potassium 3.2, and low albumin 2.8. CBC showing leukocytosis 15.7 (which is chronic in nature), stable H&H and normal platelet count. Normal pro time INR. Blood cultures are in process. Lactic acid within normal range.  Chest x-ray no acute disease process.  In the ED patient has been treated with IV vancomycin.  Physical exam showed a satellite skin lesion concern for fungal infection as well as cellulitis  Hospitalist has been consulted for further evaluation  management of abdominal wall and inguinal fold cellulitis and fungal infection.     Significant labs in the ED:  Lab Orders         Blood Culture (routine x 2)         Comprehensive metabolic panel         CBC with Differential         Protime-INR         Urinalysis, w/ Reflex to Culture (Infection Suspected) -Urine, Clean Catch         I-Stat Lactic Acid, ED       Review of Systems:  ROS  Past Medical History:  Diagnosis Date  . Anxiety   . Bilateral swelling of feet and ankles   . Bipolar 1 disorder (HCC)   . Bruised kidney 09/10/2016  . Chronic lower back pain   . Chronic pain   . Daily headache   . Diastolic dysfunction with chronic heart failure (HCC)   . Fundic gland polyps of stomach, benign   . GERD (gastroesophageal reflux disease)   . Hepatitis C    "treated; got down to 0 load 6-8 years ago" (09/13/2016)  . Hypertension   . Insomnia   . Memory difficulties    "in the last week or so" (09/13/2016)  . Morbid obesity (HCC)   . Personal history of colonic adenoma 01/20/2013  . Recurrent falls    "recently" (09/13/2016)  . Sleep apnea    "wore mask at night before; couldn't tolerate it" (09/13/2016)  . Wears glasses     Past Surgical History:  Procedure Laterality Date  . ANKLE FRACTURE SURGERY Bilateral 2001  . APPENDECTOMY  1975  . BIOPSY  11/11/2018   Procedure: BIOPSY;  Surgeon:  Kenney Peacemaker, MD;  Location: [facility];  Service: Endoscopy;;  . COLONOSCOPY    . COLONOSCOPY WITH PROPOFOL  N/A 11/11/2018   Procedure: COLONOSCOPY WITH PROPOFOL ;  Surgeon: Kenney Peacemaker, MD;  Location: [facility];  Service: Endoscopy;  Laterality: N/A;  . ESOPHAGOGASTRODUODENOSCOPY (EGD) WITH PROPOFOL  N/A 11/11/2018   Procedure: ESOPHAGOGASTRODUODENOSCOPY (EGD) WITH PROPOFOL ;  Surgeon: Kenney Peacemaker, MD;  Location: [facility];  Service: Endoscopy;  Laterality: N/A;  . FOREARM SURGERY Left 2001   "chain saw injury"  . POLYPECTOMY  11/11/2018   Procedure:  POLYPECTOMY;  Surgeon: Kenney Peacemaker, MD;  Location: [facility];  Service: Endoscopy;;  . TESTICLE TORSION REDUCTION  1980     reports that he has never smoked. His smokeless tobacco use includes snuff. He reports that he does not currently use alcohol. He reports that he does not currently use drugs.  Allergies  Allergen Reactions  . Codeine Itching    Family History  Problem Relation Age of Onset  . Hypertension Father        old age--39  . Other Father        enlarged prostate  . Colon cancer Neg Hx     Prior to Admission medications   Medication Sig Start Date End Date Taking? Authorizing Provider  albuterol  (VENTOLIN  HFA) 108 (90 Base) MCG/ACT inhaler Inhale 1 puff into the lungs every 6 (six) hours as needed for wheezing or shortness of breath. 08/19/18   Lawrance Presume, MD  atenolol  (TENORMIN ) 50 MG tablet Take 1 tablet (50 mg total) by mouth daily. 03/25/17   Carin Charleston, FNP  cyclobenzaprine  (FLEXERIL ) 10 MG tablet Take 10 mg by mouth 3 (three) times daily as needed for muscle spasms.    [provider]  diphenhydrAMINE (BENADRYL) 25 MG tablet Take 50 mg by mouth every 8 (eight) hours as needed for itching or sleep.    [provider]  escitalopram  (LEXAPRO ) 20 MG tablet Take 1 tablet (20 mg total) by mouth daily. 03/09/23 03/08/24  Nwoko, Uchenna E, PA  finasteride  (PROSCAR ) 5 MG tablet Take 5 mg by mouth daily. 11/17/21   [provider]  hydrochlorothiazide  (HYDRODIURIL ) 25 MG tablet Take 1 tablet (25 mg total) by mouth daily. 05/09/22   Vann, Jessica U, DO  lurasidone  (LATUDA ) 20 MG TABS tablet Take 1 tablet (20 mg total) by mouth daily with breakfast for 6 days, THEN 2 tablets (40 mg total) daily with breakfast for 6 days, THEN 4 tablets (80 mg total) daily with breakfast for 20 days. 01/02/23 03/04/23  Nwoko, Uchenna E, PA  minocycline  (MINOCIN ) 100 MG capsule Take 100 mg by mouth 3 (three) times daily. 10/04/18   [provider]  oxyCODONE  (ROXICODONE ) 15 MG immediate release tablet Take 15 mg by mouth every 8 (eight) hours.    [provider]  pantoprazole  (PROTONIX ) 40 MG tablet Take 1 tablet (40 mg total) by mouth 2 (two) times daily. 05/03/22   Vann, Jessica U, DO  tamsulosin  (FLOMAX ) 0.4 MG CAPS capsule Take 1 capsule (0.4 mg total) by mouth daily at 2 PM. 12/11/18   Lawrance Presume, MD  traZODone  (DESYREL ) 150 MG tablet Take 1 tablet (150 mg total) by mouth at bedtime as needed for sleep. 03/09/23   Gates Kasal, PA     Physical Exam: Vitals:   08/29/23 1931 08/29/23 2002 08/29/23 2300 08/29/23 2345  BP: (!) 98/57 124/76 (!) 134/41   Pulse: 92 99 94  Resp: 20 16 20    Temp: 98.2 F (36.8 C)   98.3 F (36.8 C)  TempSrc: Oral   Oral  SpO2: 100% 100% 100%     Physical Exam   Labs on Admission: I have personally reviewed following labs and imaging studies  CBC: Recent Labs  Lab 08/29/23 2010  WBC 15.7*  NEUTROABS 12.2*  HGB 12.0*  HCT 38.5*  MCV 83.2  PLT 493*   Basic Metabolic Panel: Recent Labs  Lab 08/29/23 2010  NA 133*  K 3.2*  CL 91*  CO2 31  GLUCOSE 94  BUN 9  CREATININE 0.96  CALCIUM 8.7*   GFR: CrCl cannot be calculated (Unknown ideal weight.). Liver Function Tests: Recent Labs  Lab 08/29/23 2010  AST 16  ALT 11  ALKPHOS 94  BILITOT 0.6  PROT 7.6  ALBUMIN 2.8*   No results for input(s): "LIPASE", "AMYLASE" in the last 168 hours. No results for input(s): "AMMONIA" in the last 168 hours. Coagulation Profile: Recent Labs  Lab 08/29/23 2010  INR 1.1   Cardiac Enzymes: No results for input(s): "CKTOTAL", "CKMB", "CKMBINDEX", "TROPONINI", "TROPONINIHS" in the last 168 hours. BNP (last 3 results) No results for input(s): "BNP" in the last 8760 hours. HbA1C: No results for input(s): "HGBA1C" in the last 72 hours. CBG: No results for input(s): "GLUCAP" in the last 168 hours. Lipid Profile: No results for input(s): "CHOL", "HDL",  "LDLCALC", "TRIG", "CHOLHDL", "LDLDIRECT" in the last 72 hours. Thyroid Function Tests: No results for input(s): "TSH", "T4TOTAL", "FREET4", "T3FREE", "THYROIDAB" in the last 72 hours. Anemia Panel: No results for input(s): "VITAMINB12", "FOLATE", "FERRITIN", "TIBC", "IRON", "RETICCTPCT" in the last 72 hours. Urine analysis:    Component Value Date/Time   COLORURINE YELLOW 08/29/2023 2142   APPEARANCEUR CLEAR 08/29/2023 2142   APPEARANCEUR Clear 10/14/2018 1132   LABSPEC 1.009 08/29/2023 2142   PHURINE 8.0 08/29/2023 2142   GLUCOSEU NEGATIVE 08/29/2023 2142   HGBUR NEGATIVE 08/29/2023 2142   BILIRUBINUR NEGATIVE 08/29/2023 2142   BILIRUBINUR Negative 10/14/2018 1132   KETONESUR NEGATIVE 08/29/2023 2142   PROTEINUR NEGATIVE 08/29/2023 2142   UROBILINOGEN 1.0 06/06/2016 1012   UROBILINOGEN 0.2 12/04/2012 1755   NITRITE NEGATIVE 08/29/2023 2142   LEUKOCYTESUR NEGATIVE 08/29/2023 2142    Radiological Exams on Admission: I have personally reviewed images DG Chest Port 1 View Result Date: 08/29/2023 CLINICAL DATA:  Questionable sepsis EXAM: PORTABLE CHEST 1 VIEW COMPARISON:  05/24/2017 FINDINGS: Heart and mediastinal contours are within normal limits. No focal opacities or effusions. No acute bony abnormality. IMPRESSION: No active disease. Electronically Signed   By: Janeece Mechanic M.D.   On: 08/29/2023 20:34     EKG: My personal interpretation of EKG shows: ***    Assessment/Plan: Active Problems:   * No active hospital problems. *    Assessment and Plan: No notes have been filed under this hospital service. Service: Hospitalist      DVT prophylaxis:  {Blank single:19197::"Lovenox ","SQ Heparin ","IV heparin  gtts","Xarelto","Eliquis","Coumadin","SCDs","***"} Code Status:  {Blank single:19197::"Full Code","DNR with Intubation","DNR/DNI(Do NOT Intubate)","Comfort Care","***"} Diet:  Family Communication:  *** Family was present at bedside, at the time of interview.   Opportunity was given to ask question and all questions were answered satisfactorily.  Disposition Plan:  ***  Consults:  ***  Admission status:   {Blank single:19197::"Observation","Inpatient"}, {Blank single:19197::"Med-Surg","Telemetry bed","Step Down Unit"}  Severity of Illness: {Observation/Inpatient:21159}    Jetty Mort, MD [facility]  How to contact the [facility] Attending or Consulting provider 7A - 7P or covering provider  during after hours 7P -7A, for this patient.  Check the care team in [facility] and look for a) attending/consulting [facility] provider listed and b) the [facility] team listed Log into [email or web address] and use [place]'s universal password to access. If you do not have the password, please contact the hospital operator. Locate the [facility] provider you are looking for under [facility] and page to a number that you can be directly reached. If you still have difficulty reaching the provider, please page the [facility] (Director on Call) for the Hospitalists listed on amion for assistance.  08/29/2023, 11:51 PM

## 2023-08-29 NOTE — H&P (Signed)
 History and Physical    Christian Guerra [facility] GNF:[id number] DOB: 04/30/1960 DOA: 08/29/2023  PCP: Christian Lindau, FNP   Patient coming from: Home   Chief Complaint: No chief complaint on file.  ED TRIAGE note:  Patient complains of redness and sores around his abdomen under his skin folds. Patient states that his PCP had been treating him for fungal infection to abdomen area and states wounds have no developed and it is getting worse per patient. Patient had been going to his PCP weekly for skin and wound treatment. Patient has just finished up oral antibiotics.       HPI:  Christian Guerra is a 63 y.o. 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 At outpatient patient has been treated with oral clindamycin and topical fungal ointment without any improvement. During my evaluation at bedside patient denies any fever, chill, nausea and vomiting.  Reporting generalized abdominal skin wall discomfort and pain.  ED Course:  At presentation to ED patient found hypotensive blood pressure was 98/57 otherwise hemodynamically stable. CMP showing low sodium 133, low potassium 3.2, and low albumin 2.8. CBC showing leukocytosis 15.7 (which is chronic in nature), stable H&H and normal platelet count. Normal pro time INR. Blood cultures are in process. Lactic acid within normal range.  Chest x-ray no acute disease process.  In the ED patient has been treated with IV  vancomycin .  Physical exam showed a satellite skin lesion concern for fungal infection as well as cellulitis  Hospitalist has been consulted for further evaluation management of abdominal wall and inguinal fold cellulitis and fungal infection.     Significant labs in the ED: Lab Orders         Blood Culture (routine x 2)         Comprehensive metabolic panel         CBC with Differential         Protime-INR         Urinalysis, w/ Reflex to Culture (Infection Suspected) -Urine, Clean Catch         CBC         Comprehensive metabolic panel         HIV Antibody (routine testing w rflx)         I-Stat Lactic Acid, ED       Review of Systems:  Review of Systems  Constitutional:  Negative for chills, fever, malaise/fatigue and weight loss.  Respiratory:  Negative for cough, sputum production and shortness of breath.   Cardiovascular:  Negative for chest pain and palpitations.  Gastrointestinal:  Negative for abdominal pain, heartburn, nausea and vomiting.  Skin:  Positive for rash.       Abdominal skin fold erythema and itchiness.  Inguinal skin fold.  Trauma itchiness  Neurological:  Negative for dizziness and headaches.  Psychiatric/Behavioral:  The patient is nervous/anxious.     Past Medical History:  Diagnosis Date   Anxiety    Bilateral swelling of feet and ankles    Bipolar 1 disorder (HCC)    Bruised kidney 09/10/2016   Chronic lower back pain    Chronic pain    Daily headache  Diastolic dysfunction with chronic heart failure (HCC)    Fundic gland polyps of stomach, benign    GERD (gastroesophageal reflux disease)    Hepatitis C    "treated; got down to 0 load 6-8 years ago" (09/13/2016)   Hypertension    Insomnia    Memory difficulties    "in the last week or so" (09/13/2016)   Morbid obesity (HCC)    Personal history of colonic adenoma 01/20/2013   Recurrent falls    "recently" (09/13/2016)   Sleep apnea    "wore mask at night before; couldn't tolerate it"  (09/13/2016)   Wears glasses     Past Surgical History:  Procedure Laterality Date   ANKLE FRACTURE SURGERY Bilateral 2001   APPENDECTOMY  1975   BIOPSY  11/11/2018   Procedure: BIOPSY;  Surgeon: Kenney Peacemaker, MD;  Location: [facility];  Service: Endoscopy;;   COLONOSCOPY     COLONOSCOPY WITH PROPOFOL  N/A 11/11/2018   Procedure: COLONOSCOPY WITH PROPOFOL ;  Surgeon: Kenney Peacemaker, MD;  Location: [facility];  Service: Endoscopy;  Laterality: N/A;   ESOPHAGOGASTRODUODENOSCOPY (EGD) WITH PROPOFOL  N/A 11/11/2018   Procedure: ESOPHAGOGASTRODUODENOSCOPY (EGD) WITH PROPOFOL ;  Surgeon: Kenney Peacemaker, MD;  Location: [facility];  Service: Endoscopy;  Laterality: N/A;   FOREARM SURGERY Left 2001   "chain saw injury"   POLYPECTOMY  11/11/2018   Procedure: POLYPECTOMY;  Surgeon: Kenney Peacemaker, MD;  Location: [facility];  Service: Endoscopy;;   TESTICLE TORSION REDUCTION  1980     reports that he has never smoked. His smokeless tobacco use includes snuff. He reports that he does not currently use alcohol. He reports that he does not currently use drugs.  Allergies  Allergen Reactions   Codeine Itching    Family History  Problem Relation Age of Onset   Hypertension Father        old age--95   Other Father        enlarged prostate   Colon cancer Neg Hx     Prior to Admission medications   Medication Sig Start Date End Date Taking? Authorizing Provider  albuterol  (VENTOLIN  HFA) 108 (90 Base) MCG/ACT inhaler Inhale 1 puff into the lungs every 6 (six) hours as needed for wheezing or shortness of breath. 08/19/18   Lawrance Presume, MD  atenolol  (TENORMIN ) 50 MG tablet Take 1 tablet (50 mg total) by mouth daily. 03/25/17   Carin Charleston, FNP  cyclobenzaprine  (FLEXERIL ) 10 MG tablet Take 10 mg by mouth 3 (three) times daily as needed for muscle spasms.    [provider]  diphenhydrAMINE (BENADRYL) 25 MG tablet Take 50 mg by mouth every 8 (eight) hours as needed for  itching or sleep.    [provider]  escitalopram  (LEXAPRO ) 20 MG tablet Take 1 tablet (20 mg total) by mouth daily. 03/09/23 03/08/24  Nwoko, Uchenna E, PA  finasteride  (PROSCAR ) 5 MG tablet Take 5 mg by mouth daily. 11/17/21   [provider]  hydrochlorothiazide  (HYDRODIURIL ) 25 MG tablet Take 1 tablet (25 mg total) by mouth daily. 05/09/22   Vann, Jessica U, DO  lurasidone  (LATUDA ) 20 MG TABS tablet Take 1 tablet (20 mg total) by mouth daily with breakfast for 6 days, THEN 2 tablets (40 mg total) daily with breakfast for 6 days, THEN 4 tablets (80 mg total) daily with breakfast for 20 days. 01/02/23 03/04/23  Nwoko, Uchenna E, PA  minocycline  (MINOCIN ) 100 MG capsule Take 100 mg by mouth 3 (three) times  daily. 10/04/18   [provider]  oxyCODONE  (ROXICODONE ) 15 MG immediate release tablet Take 15 mg by mouth every 8 (eight) hours.    [provider]  pantoprazole  (PROTONIX ) 40 MG tablet Take 1 tablet (40 mg total) by mouth 2 (two) times daily. 05/03/22   Vann, Jessica U, DO  tamsulosin  (FLOMAX ) 0.4 MG CAPS capsule Take 1 capsule (0.4 mg total) by mouth daily at 2 PM. 12/11/18   Lawrance Presume, MD  traZODone  (DESYREL ) 150 MG tablet Take 1 tablet (150 mg total) by mouth at bedtime as needed for sleep. 03/09/23   Gates Kasal, PA     Physical Exam: Vitals:   08/29/23 2300 08/29/23 2345 08/30/23 0116 08/30/23 0137  BP: (!) 134/41  123/67   Pulse: 94  95   Resp: 20  18   Temp:  98.3 F (36.8 C) 98.1 F (36.7 C)   TempSrc:  Oral    SpO2: 100%  100%   Weight:    (!) 145.6 kg  Height:    5\' 6"  (1.676 m)    Physical Exam Constitutional:      Appearance: He is obese. He is not ill-appearing.  HENT:     Mouth/Throat:     Mouth: Mucous membranes are moist.  Cardiovascular:     Rate and Rhythm: Normal rate and regular rhythm.     Pulses: Normal pulses.     Heart sounds: Normal heart sounds.  Pulmonary:     Effort: Pulmonary effort is normal.      Breath sounds: Normal breath sounds.  Abdominal:     Palpations: Abdomen is soft.     Tenderness: There is no abdominal tenderness.     Comments: Abdominal wall generalized redness with scattered lesion and erythema  Musculoskeletal:     Cervical back: Normal range of motion and neck supple.  Skin:    General: Skin is warm.     Capillary Refill: Capillary refill takes less than 2 seconds.     Findings: Erythema, lesion and rash present. No bruising.     Comments: Abdominal skin and inguinal fold erythema and scattered lesions  Neurological:     Mental Status: He is alert and oriented to person, place, and time.  Psychiatric:        Mood and Affect: Mood normal.        Behavior: Behavior normal.        Thought Content: Thought content normal.      Media Information   Document Information  Photos    08/29/2023 23:17  Attached To:  Hospital Encounter on 08/29/23  Source Information  Scarlette Currier, MD  [facility]  Document History      Media Information   Document Information  Photos    08/29/2023 23:16  Attached To:  Hospital Encounter on 08/29/23  Source Information  Scarlette Currier, MD  [facility]  Document History     Media Information   Document Information  Photos    08/29/2023 23:16  Attached To:  Hospital Encounter on 08/29/23  Source Information  Scarlette Currier, MD  [facility]  Document History    Labs on Admission: I have personally reviewed following labs and imaging studies  CBC: Recent Labs  Lab 08/29/23 2010  WBC 15.7*  NEUTROABS 12.2*  HGB 12.0*  HCT 38.5*  MCV 83.2  PLT 493*   Basic Metabolic Panel: Recent Labs  Lab 08/29/23 2010  NA 133*  K 3.2*  CL 91*  CO2 31  GLUCOSE 94  BUN 9  CREATININE 0.96  CALCIUM 8.7*   GFR: Estimated Creatinine Clearance: 108.9 mL/min (by C-G formula based on SCr of 0.96 mg/dL). Liver Function Tests: Recent Labs  Lab 08/29/23 2010  AST 16  ALT 11  ALKPHOS  94  BILITOT 0.6  PROT 7.6  ALBUMIN 2.8*   No results for input(s): "LIPASE", "AMYLASE" in the last 168 hours. No results for input(s): "AMMONIA" in the last 168 hours. Coagulation Profile: Recent Labs  Lab 08/29/23 2010  INR 1.1   Cardiac Enzymes: No results for input(s): "CKTOTAL", "CKMB", "CKMBINDEX", "TROPONINI", "TROPONINIHS" in the last 168 hours. BNP (last 3 results) No results for input(s): "BNP" in the last 8760 hours. HbA1C: No results for input(s): "HGBA1C" in the last 72 hours. CBG: No results for input(s): "GLUCAP" in the last 168 hours. Lipid Profile: No results for input(s): "CHOL", "HDL", "LDLCALC", "TRIG", "CHOLHDL", "LDLDIRECT" in the last 72 hours. Thyroid Function Tests: No results for input(s): "TSH", "T4TOTAL", "FREET4", "T3FREE", "THYROIDAB" in the last 72 hours. Anemia Panel: No results for input(s): "VITAMINB12", "FOLATE", "FERRITIN", "TIBC", "IRON", "RETICCTPCT" in the last 72 hours. Urine analysis:    Component Value Date/Time   COLORURINE YELLOW 08/29/2023 2142   APPEARANCEUR CLEAR 08/29/2023 2142   APPEARANCEUR Clear 10/14/2018 1132   LABSPEC 1.009 08/29/2023 2142   PHURINE 8.0 08/29/2023 2142   GLUCOSEU NEGATIVE 08/29/2023 2142   HGBUR NEGATIVE 08/29/2023 2142   BILIRUBINUR NEGATIVE 08/29/2023 2142   BILIRUBINUR Negative 10/14/2018 1132   KETONESUR NEGATIVE 08/29/2023 2142   PROTEINUR NEGATIVE 08/29/2023 2142   UROBILINOGEN 1.0 06/06/2016 1012   UROBILINOGEN 0.2 12/04/2012 1755   NITRITE NEGATIVE 08/29/2023 2142   LEUKOCYTESUR NEGATIVE 08/29/2023 2142    Radiological Exams on Admission: I have personally reviewed images DG Chest Port 1 View Result Date: 08/29/2023 CLINICAL DATA:  Questionable sepsis EXAM: PORTABLE CHEST 1 VIEW COMPARISON:  05/24/2017 FINDINGS: Heart and mediastinal contours are within normal limits. No focal opacities or effusions. No acute bony abnormality. IMPRESSION: No active disease. Electronically Signed   By: Janeece Mechanic M.D.   On: 08/29/2023 20:34     EKG: My personal interpretation of EKG shows: Normal rhythm heart rate 87    Assessment/Plan: Principal Problem:   Cellulitis of abdominal wall Active Problems:   Abdominal wall cellulitis   Fungal infection of skin of abdomen   Chronic hepatitis C virus infection (HCC)   Essential hypertension   Generalized anxiety disorder   Chronic diastolic CHF (congestive heart failure) (HCC)   Hypokalemia   H/O leukocytosis   Bipolar disorder (HCC)   NASH (nonalcoholic steatohepatitis)    Assessment and Plan: Cellulitis of the abdominal wall and inguinal region Fungal infection of the abdominal wall and inguinal region -Patient presents emergency department worsening abdominal wall infection not improving with oral antibiotic and topical fungal ointment. - At presentation to ED patient found hypotensive otherwise hemodynamically stable.  CBC showing leukocytosis which is chronic in nature.  Normal lactic acid level. - Blood cultures are in process. - Physical exam showed abdominal skin fold beefy lesion and generalized erythema. - Concern for abdominal wall cellulitis likely secondary from underlying bacterial and fungal infection. -ED patient has been treated with IV vancomycin . - Continue IV vancomycin  and IV cefepime pharmacy consult - Starting IV fluconazole 200 mg daily. - Need to follow-up with blood culture result for appropriate antibiotic guidance. -Due to abdominal wall/skin discomfort and pain in the setting of infection continue Roxicodone  for  moderate and severe pain.   Essential hypertension Grade 2 diastolic heart failure preserved EF 55 to 60% -Pressure is borderline soft initially however which has been improved.  Holding home blood pressure regimen include atenolol  and hydrochlorothiazide .  Will resume once appropriate.  Generalized anxiety disorder Bipolar disorder -Patient reported significant anxiety requesting for some as  needed anxiety medication.  Starting Ativan  as needed. -Pending pharmacy verification of home medications.  History of chronic leukocytosis -Following up outpatient oncology concern for reactive leukocytosis.  Bipolar disorder  History of chronic hepatitis C infection History of [place] Chronic transaminitis - History of chronic hepatitis C status posttreatment around 2020.  Being followed by GI for nonalcoholic fatty liver and transaminitis.  History of OSA - Patient reported not using CPAP anymore.   DVT prophylaxis:  Lovenox  Code Status:  Full Code Diet: Heart healthy diet Disposition Plan: Continue broad-spectrum antibiotic coverage and can de-escalate based on the blood cultures result. Consults: None at this time Admission status:   Inpatient, Telemetry bed  Severity of Illness: The appropriate patient status for this patient is INPATIENT. Inpatient status is judged to be reasonable and necessary in order to provide the required intensity of service to ensure the patient's safety. The patient's presenting symptoms, physical exam findings, and initial radiographic and laboratory data in the context of their chronic comorbidities is felt to place them at high risk for further clinical deterioration. Furthermore, it is not anticipated that the patient will be medically stable for discharge from the hospital within 2 midnights of admission.   * I certify that at the point of admission it is my clinical judgment that the patient will require inpatient hospital care spanning beyond 2 midnights from the point of admission due to high intensity of service, high risk for further deterioration and high frequency of surveillance required.Aaron Aas    Forest Pruden, MD [facility]  How to contact the [facility] Attending or Consulting provider 7A - 7P or covering provider during after hours 7P -7A, for this patient.  Check the care team in [facility] and look for a) attending/consulting [facility] provider  listed and b) the [facility] team listed Log into [email or web address] and use [place] universal password to access. If you do not have the password, please contact the hospital operator. Locate the [facility] provider you are looking for under [facility] and page to a number that you can be directly reached. If you still have difficulty reaching the provider, please page the [facility] (Director on Call) for the Hospitalists listed on amion for assistance.  08/30/2023, 3:41 AM

## 2023-08-30 ENCOUNTER — Encounter (HOSPITAL_COMMUNITY): Payer: Self-pay | Admitting: Internal Medicine

## 2023-08-30 ENCOUNTER — Other Ambulatory Visit: Payer: Self-pay

## 2023-08-30 DIAGNOSIS — Z885 Allergy status to narcotic agent status: Secondary | ICD-10-CM | POA: Diagnosis not present

## 2023-08-30 DIAGNOSIS — B182 Chronic viral hepatitis C: Secondary | ICD-10-CM | POA: Diagnosis present

## 2023-08-30 DIAGNOSIS — K7581 Nonalcoholic steatohepatitis (NASH): Secondary | ICD-10-CM | POA: Insufficient documentation

## 2023-08-30 DIAGNOSIS — I110 Hypertensive heart disease with heart failure: Secondary | ICD-10-CM | POA: Diagnosis present

## 2023-08-30 DIAGNOSIS — I5032 Chronic diastolic (congestive) heart failure: Secondary | ICD-10-CM | POA: Diagnosis present

## 2023-08-30 DIAGNOSIS — Z79899 Other long term (current) drug therapy: Secondary | ICD-10-CM | POA: Diagnosis not present

## 2023-08-30 DIAGNOSIS — G4700 Insomnia, unspecified: Secondary | ICD-10-CM | POA: Diagnosis present

## 2023-08-30 DIAGNOSIS — K219 Gastro-esophageal reflux disease without esophagitis: Secondary | ICD-10-CM | POA: Diagnosis present

## 2023-08-30 DIAGNOSIS — E876 Hypokalemia: Secondary | ICD-10-CM | POA: Diagnosis present

## 2023-08-30 DIAGNOSIS — L03311 Cellulitis of abdominal wall: Secondary | ICD-10-CM | POA: Diagnosis present

## 2023-08-30 DIAGNOSIS — Z87891 Personal history of nicotine dependence: Secondary | ICD-10-CM | POA: Diagnosis not present

## 2023-08-30 DIAGNOSIS — G4733 Obstructive sleep apnea (adult) (pediatric): Secondary | ICD-10-CM | POA: Diagnosis present

## 2023-08-30 DIAGNOSIS — F319 Bipolar disorder, unspecified: Secondary | ICD-10-CM | POA: Insufficient documentation

## 2023-08-30 DIAGNOSIS — F411 Generalized anxiety disorder: Secondary | ICD-10-CM | POA: Diagnosis present

## 2023-08-30 DIAGNOSIS — Z6841 Body Mass Index (BMI) 40.0 and over, adult: Secondary | ICD-10-CM | POA: Diagnosis not present

## 2023-08-30 DIAGNOSIS — D649 Anemia, unspecified: Secondary | ICD-10-CM | POA: Diagnosis present

## 2023-08-30 DIAGNOSIS — B372 Candidiasis of skin and nail: Secondary | ICD-10-CM | POA: Diagnosis present

## 2023-08-30 DIAGNOSIS — Z8249 Family history of ischemic heart disease and other diseases of the circulatory system: Secondary | ICD-10-CM | POA: Diagnosis not present

## 2023-08-30 LAB — COMPREHENSIVE METABOLIC PANEL WITH GFR
ALT: 11 U/L (ref 0–44)
AST: 14 U/L — ABNORMAL LOW (ref 15–41)
Albumin: 2.7 g/dL — ABNORMAL LOW (ref 3.5–5.0)
Alkaline Phosphatase: 81 U/L (ref 38–126)
Anion gap: 12 (ref 5–15)
BUN: 8 mg/dL (ref 8–23)
CO2: 30 mmol/L (ref 22–32)
Calcium: 8.6 mg/dL — ABNORMAL LOW (ref 8.9–10.3)
Chloride: 94 mmol/L — ABNORMAL LOW (ref 98–111)
Creatinine, Ser: 0.72 mg/dL (ref 0.61–1.24)
GFR, Estimated: >60 mL/min (ref >60)
Glucose, Bld: 91 mg/dL (ref 70–99)
Potassium: 3.2 mmol/L — ABNORMAL LOW (ref 3.5–5.1)
Sodium: 136 mmol/L (ref 135–145)
Total Bilirubin: 0.5 mg/dL (ref 0.0–1.2)
Total Protein: 6.7 g/dL (ref 6.5–8.1)

## 2023-08-30 LAB — CBC
HCT: 35.8 % — ABNORMAL LOW (ref 39.0–52.0)
Hemoglobin: 11.1 g/dL — ABNORMAL LOW (ref 13.0–17.0)
MCH: 26.6 pg (ref 26.0–34.0)
MCHC: 31 g/dL (ref 30.0–36.0)
MCV: 85.6 fL (ref 80.0–100.0)
Platelets: 382 10*3/uL (ref 150–400)
RBC: 4.18 MIL/uL — ABNORMAL LOW (ref 4.22–5.81)
RDW: 16.1 % — ABNORMAL HIGH (ref 11.5–15.5)
WBC: 11.9 10*3/uL — ABNORMAL HIGH (ref 4.0–10.5)
nRBC: 0 % (ref 0.0–0.2)

## 2023-08-30 LAB — I-STAT CG4 LACTIC ACID, ED: Lactic Acid, Venous: 0.9 mmol/L (ref 0.5–1.9)

## 2023-08-30 LAB — HIV ANTIBODY (ROUTINE TESTING W REFLEX): HIV Screen 4th Generation wRfx: NONREACTIVE

## 2023-08-30 MED ORDER — ACETAMINOPHEN 650 MG RE SUPP: 650.0000 mg | Freq: Four times a day (QID) | RECTAL | Status: DC | PRN

## 2023-08-30 MED ORDER — NYSTATIN 100000 UNIT/GM EX POWD
Freq: Two times a day (BID) | CUTANEOUS | Status: DC
  Filled 2023-08-30: qty 15

## 2023-08-30 MED ORDER — ONDANSETRON HCL 4 MG PO TABS: 4.0000 mg | ORAL_TABLET | Freq: Four times a day (QID) | ORAL | Status: DC | PRN

## 2023-08-30 MED ORDER — SODIUM CHLORIDE 0.9% FLUSH
3.0000 mL | Freq: Two times a day (BID) | INTRAVENOUS | Status: DC
  Administered 2023-08-30 – 2023-08-31 (×4): 3 mL via INTRAVENOUS

## 2023-08-30 MED ORDER — LORAZEPAM 0.5 MG PO TABS: 0.5000 mg | ORAL_TABLET | Freq: Four times a day (QID) | ORAL | Status: DC | PRN

## 2023-08-30 MED ORDER — ACETAMINOPHEN 325 MG PO TABS: 650.0000 mg | ORAL_TABLET | Freq: Four times a day (QID) | ORAL | Status: DC | PRN

## 2023-08-30 MED ORDER — SODIUM CHLORIDE 0.9% FLUSH: 3.0000 mL | INTRAVENOUS | Status: DC | PRN

## 2023-08-30 MED ORDER — PANTOPRAZOLE SODIUM 40 MG PO TBEC
40.0000 mg | DELAYED_RELEASE_TABLET | Freq: Two times a day (BID) | ORAL | Status: DC
  Administered 2023-08-30 – 2023-08-31 (×4): 40 mg via ORAL
  Filled 2023-08-30 (×4): qty 1

## 2023-08-30 MED ORDER — VANCOMYCIN HCL 1.25 G IV SOLR
1250.0000 mg | Freq: Two times a day (BID) | INTRAVENOUS | Status: DC
  Administered 2023-08-30 – 2023-08-31 (×2): 1250 mg via INTRAVENOUS
  Filled 2023-08-30 (×3): qty 25

## 2023-08-30 MED ORDER — TAMSULOSIN HCL 0.4 MG PO CAPS
0.4000 mg | ORAL_CAPSULE | Freq: Every day | ORAL | Status: DC
  Administered 2023-08-30 – 2023-08-31 (×2): 0.4 mg via ORAL
  Filled 2023-08-30 (×2): qty 1

## 2023-08-30 MED ORDER — POTASSIUM CHLORIDE CRYS ER 20 MEQ PO TBCR
20.0000 meq | EXTENDED_RELEASE_TABLET | Freq: Once | ORAL | Status: AC
  Administered 2023-08-30: 20 meq via ORAL
  Filled 2023-08-30: qty 1

## 2023-08-30 MED ORDER — ONDANSETRON HCL 4 MG/2ML IJ SOLN
4.0000 mg | Freq: Four times a day (QID) | INTRAMUSCULAR | Status: DC | PRN
Start: 2023-08-30 — End: 2023-08-31

## 2023-08-30 MED ORDER — FLUCONAZOLE IN SODIUM CHLORIDE 200-0.9 MG/100ML-% IV SOLN
200.0000 mg | INTRAVENOUS | Status: DC
  Administered 2023-08-30 – 2023-08-31 (×2): 200 mg via INTRAVENOUS
  Filled 2023-08-30 (×2): qty 100

## 2023-08-30 MED ORDER — ALBUTEROL SULFATE (2.5 MG/3ML) 0.083% IN NEBU: 2.5000 mg | INHALATION_SOLUTION | Freq: Four times a day (QID) | RESPIRATORY_TRACT | Status: DC | PRN

## 2023-08-30 MED ORDER — MINOCYCLINE HCL 50 MG PO CAPS
100.0000 mg | ORAL_CAPSULE | Freq: Once | ORAL | Status: AC
  Administered 2023-08-30: 100 mg via ORAL
  Filled 2023-08-30: qty 2

## 2023-08-30 MED ORDER — SODIUM CHLORIDE 0.9 % IV SOLN: 250.0000 mL | INTRAVENOUS | Status: AC | PRN

## 2023-08-30 MED ORDER — ENOXAPARIN SODIUM 40 MG/0.4ML IJ SOSY
40.0000 mg | PREFILLED_SYRINGE | INTRAMUSCULAR | Status: DC
  Administered 2023-08-30 – 2023-08-31 (×2): 40 mg via SUBCUTANEOUS
  Filled 2023-08-30 (×2): qty 0.4

## 2023-08-30 MED ORDER — OXYCODONE HCL 5 MG PO TABS
5.0000 mg | ORAL_TABLET | Freq: Four times a day (QID) | ORAL | Status: DC | PRN
  Administered 2023-08-30 (×2): 5 mg via ORAL
  Filled 2023-08-30 (×2): qty 1

## 2023-08-30 MED ORDER — VANCOMYCIN HCL 1500 MG/300ML IV SOLN
1500.0000 mg | Freq: Once | INTRAVENOUS | Status: AC
  Administered 2023-08-30: 1500 mg via INTRAVENOUS
  Filled 2023-08-30: qty 300

## 2023-08-30 MED ORDER — SODIUM CHLORIDE 0.9 % IV SOLN
2.0000 g | Freq: Three times a day (TID) | INTRAVENOUS | Status: DC
  Administered 2023-08-30 – 2023-08-31 (×4): 2 g via INTRAVENOUS
  Filled 2023-08-30 (×4): qty 12.5

## 2023-08-30 MED ORDER — OXYCODONE HCL 5 MG PO TABS
5.0000 mg | ORAL_TABLET | Freq: Four times a day (QID) | ORAL | Status: DC | PRN
  Administered 2023-08-30: 5 mg via ORAL
  Filled 2023-08-30: qty 1

## 2023-08-30 MED ORDER — ALBUTEROL SULFATE HFA 108 (90 BASE) MCG/ACT IN AERS: 1.0000 | INHALATION_SPRAY | Freq: Four times a day (QID) | RESPIRATORY_TRACT | Status: DC | PRN

## 2023-08-30 MED ORDER — SODIUM CHLORIDE 0.9 % IV SOLN
2.0000 g | Freq: Once | INTRAVENOUS | Status: AC
  Administered 2023-08-30: 2 g via INTRAVENOUS
  Filled 2023-08-30: qty 12.5

## 2023-08-30 NOTE — Progress Notes (Signed)
 Discharge medication delivered to patient at bedside [facility]

## 2023-08-30 NOTE — Plan of Care (Signed)
  Problem: Pain Managment: Goal: General experience of comfort will improve and/or be controlled Outcome: Progressing   Problem: Skin Integrity: Goal: Risk for impaired skin integrity will decrease Outcome: Progressing   Problem: Skin Integrity: Goal: Skin integrity will improve Outcome: Progressing

## 2023-08-30 NOTE — Plan of Care (Signed)

## 2023-08-30 NOTE — Progress Notes (Signed)
 Pharmacy Antibiotic Note  Christian Guerra is a 63 y.o. male admitted on 08/29/2023 with cellulitis of abdominal wall.  Vancomycin  1g IV and Cefepime 2gm IV x 1 dose each given in the ED.  Pharmacy has been consulted for Vancomycin  and Cefepime dosing.  Plan: Give additional Vancomycin  1500mg  IV x 1 dose now (for total loading dose of 2500mg ) the continue with Vancomycin  1250 mg IV Q 12 hrs. Goal AUC 400-550.  Expected AUC: 535.3  SCr used: 0.96 Cefepime 2g IV q8h Fluconazole per MD Follow renal function F/u culture results & sensitivities  Height: 5\' 6"  (167.6 cm) Weight: (!) 145.6 kg (320 lb 15.8 oz) IBW/kg (Calculated) : 63.8  Temp (24hrs), Avg:98.2 F (36.8 C), Min:98.1 F (36.7 C), Max:98.3 F (36.8 C)  Recent Labs  Lab 08/29/23 2010 08/29/23 2026 08/30/23 0007  WBC 15.7*  --   --   CREATININE 0.96  --   --   LATICACIDVEN  --  1.0 0.9    Estimated Creatinine Clearance: 108.9 mL/min (by C-G formula based on SCr of 0.96 mg/dL).    Allergies  Allergen Reactions   Codeine Itching    Antimicrobials this admission: 5/8 Cefepime >>   5/8 Vancomycin  >>   5/9 Fluconazole >>  Dose adjustments this admission:    Microbiology results: 5/8 BCx:      Thank you for allowing pharmacy to be a part of this patient's care.  Rulon Councilman, PharmD 08/30/2023 1:57 AM

## 2023-08-30 NOTE — TOC Initial Note (Signed)
 Transition of Care [facility]) - Initial/Assessment Note    Patient Details  Name: Christian Guerra MRN: [id number] Date of Birth: [birth year]  [facility] CM/SW Contact:    Tessie Fila, RN Phone Number: 08/30/2023, 1:50 PM  Clinical Narrative:                 Met with pt and pt state that he has had some trouble in the past with affording food. Pt agrees to have resources added to AVS. Referrals sent out through [facility]. Pt states he does not believe that he can properly care for his wounds at home. Spoke about needing HH Nurse at discharge to educate and assist with wound care at home. TOC will continue to follow for any new recommendation or needs.   Expected Discharge Plan: Home/Self Care Barriers to Discharge: Continued Medical Work up   Patient Goals and CMS Choice Patient states their goals for this hospitalization and ongoing recovery are:: To return home CMS Medicare.gov Compare Post Acute Care list provided to:: Other (Comment Required) (NA) Choice offered to / list presented to : NA [place] ownership interest in [facility].provided to:: Parent NA    Expected Discharge Plan and Services In-house Referral: NA Discharge Planning Services: CM Consult Post Acute Care Choice: NA Living arrangements for the past 2 months: Single Family Home                 DME Arranged: N/A DME Agency: NA       HH Arranged: NA HH Agency: NA        Prior Living Arrangements/Services Living arrangements for the past 2 months: Single Family Home Lives with:: Siblings Patient language and need for interpreter reviewed:: Yes Do you feel safe going back to the place where you live?: Yes      Need for Family Participation in Patient Care: Yes (Comment) Care giver support system in place?: Yes (comment) Current home services: Other (comment) (No current DME) Criminal Activity/Legal Involvement Pertinent to Current Situation/Hospitalization: No - Comment as  needed  Activities of Daily Living   ADL Screening (condition at time of admission) Independently performs ADLs?: Yes (appropriate for developmental age) Is the patient deaf or have difficulty hearing?: No Does the patient have difficulty seeing, even when wearing glasses/contacts?: No Does the patient have difficulty concentrating, remembering, or making decisions?: No  Permission Sought/Granted Permission sought to share information with : Family Supports Permission granted to share information with : Yes, Release of Information Signed  Share Information with NAME: Arrick, Piskor (Brother)  [phone]           Emotional Assessment Appearance:: Appears stated age Attitude/Demeanor/Rapport: Engaged Affect (typically observed): Accepting, Appropriate Orientation: : Oriented to Self, Oriented to Place, Oriented to  Time, Oriented to Situation Alcohol / Substance Use: Not Applicable Psych Involvement: No (comment)  Admission diagnosis:  Cellulitis of abdominal wall [L03.311] Patient Active Problem List   Diagnosis Date Noted   Bipolar disorder (HCC) 08/30/2023   NASH (nonalcoholic steatohepatitis) 08/30/2023   Cellulitis of abdominal wall 08/30/2023   Abdominal wall cellulitis 08/29/2023   Fungal infection of skin of abdomen 08/29/2023   H/O leukocytosis 08/29/2023   AKI (acute kidney injury) (HCC) 04/30/2022   Hypokalemia 04/30/2022   Hyponatremia 04/30/2022   Impingement syndrome of right shoulder 11/21/2021   Insomnia due to other mental disorder 10/13/2020   Panic disorder 10/13/2020   Bipolar I disorder, most recent episode depressed (HCC) 08/30/2020   Gastric polyps  11/11/2018   Abdominal pain, epigastric    Change in bowel habits    Prediabetes 08/19/2018   Physical deconditioning 08/19/2018   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   Other chronic pain 07/05/2017   Cystic acne 07/05/2017   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    Herpes simplex type 1 antibody positive 05/15/2017   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   Current non-adherence to medical treatment 12/07/2016   Morbid obesity with BMI of 60.0-69.9, adult (HCC) 09/18/2016   Chronic diastolic CHF (congestive heart failure) (HCC)    Fall    Major depression 08/04/2013   Generalized anxiety disorder 05/21/2013   History of colonic polyps 01/20/2013   OSA (obstructive sleep apnea) 09/27/2012   Chronic back pain 09/19/2012   Chronic hepatitis C virus infection (HCC) 09/19/2012   GERD (gastroesophageal reflux disease) 09/19/2012   Essential hypertension 09/19/2012   PCP:  Harry Lindau, FNP Pharmacy:   [facility] [id number] Jonette Nestle, [place] - [address] [place] [address] Phone: [phone] Fax: [phone]  [facility] [place], [place] - [address] [address] Phone: [phone] Fax: [phone]     Social Drivers of Health (SDOH) Social History: SDOH Screenings   Food Insecurity: Food Insecurity Present (08/30/2023)  Housing: Low Risk  (08/30/2023)  Transportation Needs: No Transportation Needs (08/30/2023)  Utilities: Not At Risk (08/30/2023)  Alcohol Screen: Low Risk  (08/30/2020)  Depression (PHQ2-9): High Risk (03/06/2023)  Financial Resource Strain: Not at Risk (07/09/2022)   Received from [place], [place]  Physical Activity: Not on File (05/29/2022)   Received from [place], [place]  Social Connections: Not on File (01/01/2023)   Received from [facility]  Stress: Not on File (05/29/2022)   Received from [place], [place]  Tobacco Use: High Risk (08/30/2023)   SDOH Interventions:     Readmission Risk Interventions    08/30/2023    1:45 PM  Readmission Risk Prevention Plan  Transportation Screening Complete  PCP or Specialist Appt within 5-7 Days Complete  Home Care Screening Complete  Medication Review (RN CM) Complete

## 2023-08-30 NOTE — Consult Note (Signed)
 WOC Nurse Consult Note: patient has followed with primary MD for several weeks for skin breakdown underneath pannus  Reason for Consult: abdominal wall cellulitis  Wound type: full thickness wounds r/t intertriginous dermatitis and cellulitis  ICD-10 CM Codes for Irritant Dermatitis   L30.4  - Erythema intertrigo. Also used for abrasion of the hand, chafing of the skin, dermatitis due to sweating and friction, friction dermatitis, friction eczema, and genital/thigh intertrigo.  Pressure Injury POA: NA not related to pressure  Measurement:see nursing flowsheet, scattered areas of full thickness skin loss  Wound bed: scattered areas of full thickness loss that are largely red moist  Drainage (amount, consistency, odor)  see nursing flowsheet  Periwound: erythema  Dressing procedure/placement/frequency: Clean underneath pannus/abdominal folds (intact skin and wounds) with Vashe wound cleanser Timm Foot [phone]) do not rinse and allow to air dry. Apply silver hydrofiber (Aquacel AG Lawson #[id number]) to open wounds then apply Interdry AG to entire area (will likely take 2 sheets)  Order Timm Foot # [phone] Measure and cut length of InterDry to fit in skin folds that have skin breakdown Tuck InterDry fabric into skin folds in a single layer, allow for 2 inches of overhang from skin edges to allow for wicking to occur May remove to bathe; dry area thoroughly and then tuck into affected areas again Do not apply any creams or ointments when using InterDry DO NOT THROW AWAY FOR 5 DAYS unless soiled with stool DO NOT [facility] product, this will inactivate the silver in the material  New sheet of Interdry should be applied after 5 days of use if patient continues to have skin breakdown    POC discussed with bedside nurse. WOC team will not follow. Re-consult if further needs arise.   Thank you,    Ronni Colace MSN, RN-BC, [employer] [phone]

## 2023-08-30 NOTE — Progress Notes (Signed)
 Wound care performed without Interdry- not available.

## 2023-08-30 NOTE — Progress Notes (Signed)
 PROGRESS NOTE    Zadan Ensz [facility]  VOZ:[id number] DOB: August 11, 1960 DOA: 08/29/2023 PCP: Harry Lindau, FNP  Outpatient Specialists:     Brief Narrative:  Patient is a 63 year old male, morbidly obese, with past medical history significant for neutrophilic leukocytosis (follows with outpatient hematology team), chronic axillary lymphadenopathy (question Hodgkin's lymphoma), chronic hep C infection, essential hypertension, generalized anxiety, depression, diastolic CHF, bipolar, insomnia and NASH.  Patient was admitted with worsening Candida intertrigo involving lower abdominal area and/or cellulitis.  Skin changes are improving with current regimen.  08/30/2023: Patient seen.  No new complaints.  Candida intertrigo is slowly improving.   Assessment & Plan:   Principal Problem:   Cellulitis of abdominal wall Active Problems:   Chronic hepatitis C virus infection (HCC)   Essential hypertension   Generalized anxiety disorder   Chronic diastolic CHF (congestive heart failure) (HCC)   Hypokalemia   Abdominal wall cellulitis   Fungal infection of skin of abdomen   H/O leukocytosis   Bipolar disorder (HCC)   NASH (nonalcoholic steatohepatitis)   Candida intertrigo lower abdominal wall area/possible cellulitis: - Improving. - Continue fluconazole. - Keep area dry. - Continue IV vancomycin  and cefepime for now. - Low threshold to wean off antibiotics.  Hypertension: - Controlled. - Continue current regimen.  Morbid obesity: - Diet and exercise.  OSA: - No longer using CPAP machine.  Generalized anxiety disorder/bipolar disorder: - Stable.  Chronic leukocytosis: - WBC of 11.9. -Left shift noted.  NASH: History of chronic hep C: - Consider GI referral on discharge.   DVT prophylaxis: Subcu Lovenox  Code Status: Full code. Family Communication:  Disposition Plan: Inpatient.   Consultants:  None.  Procedures:  None.  Antimicrobials:  IV vancomycin . IV  cefepime. IV Diflucan. Nystatin  powder. Low threshold to de-escalate antibiotics.   Subjective: No complaints.  Objective: Vitals:   08/30/23 0137 08/30/23 0429 08/30/23 0955 08/30/23 1329  BP:  102/68 (!) 129/56 106/72  Pulse:  94 97 97  Resp:  18 16 16   Temp:  (!) 97.5 F (36.4 C) (!) 97.5 F (36.4 C) 97.9 F (36.6 C)  TempSrc:    Oral  SpO2:  100% 100% 100%  Weight: (!) 145.6 kg     Height: 5\' 6"  (1.676 m)       Intake/Output Summary (Last 24 hours) at 08/30/2023 1835 Last data filed at 08/30/2023 1713 Gross per 24 hour  Intake 1210.98 ml  Output 1505 ml  Net -294.02 ml   Filed Weights   08/30/23 0137  Weight: (!) 145.6 kg    Examination:  General exam: Appears calm and comfortable.  Patient is morbidly obese. Respiratory system: Decreased air entry.   Cardiovascular system: S1 & S2  Gastrointestinal system: Abdomen is morbidly obese.  Soft and nontender.   Central nervous system: Alert and oriented.  Extremities: Bilateral lower extremity edema, less chronic.. Skin:    Data Reviewed: I have personally reviewed following labs and imaging studies  CBC: Recent Labs  Lab 08/29/23 2010 08/30/23 0543  WBC 15.7* 11.9*  NEUTROABS 12.2*  --   HGB 12.0* 11.1*  HCT 38.5* 35.8*  MCV 83.2 85.6  PLT 493* 382   Basic Metabolic Panel: Recent Labs  Lab 08/29/23 2010 08/30/23 0543  NA 133* 136  K 3.2* 3.2*  CL 91* 94*  CO2 31 30  GLUCOSE 94 91  BUN 9 8  CREATININE 0.96 0.72  CALCIUM 8.7* 8.6*   GFR: Estimated Creatinine Clearance: 130.7 mL/min (by C-G formula based on  SCr of 0.72 mg/dL). Liver Function Tests: Recent Labs  Lab 08/29/23 2010 08/30/23 0543  AST 16 14*  ALT 11 11  ALKPHOS 94 81  BILITOT 0.6 0.5  PROT 7.6 6.7  ALBUMIN 2.8* 2.7*   No results for input(s): "LIPASE", "AMYLASE" in the last 168 hours. No results for input(s): "AMMONIA" in the last 168 hours. Coagulation Profile: Recent Labs  Lab 08/29/23 2010  INR 1.1   Cardiac  Enzymes: No results for input(s): "CKTOTAL", "CKMB", "CKMBINDEX", "TROPONINI" in the last 168 hours. BNP (last 3 results) No results for input(s): "PROBNP" in the last 8760 hours. HbA1C: No results for input(s): "HGBA1C" in the last 72 hours. CBG: No results for input(s): "GLUCAP" in the last 168 hours. Lipid Profile: No results for input(s): "CHOL", "HDL", "LDLCALC", "TRIG", "CHOLHDL", "LDLDIRECT" in the last 72 hours. Thyroid Function Tests: No results for input(s): "TSH", "T4TOTAL", "FREET4", "T3FREE", "THYROIDAB" in the last 72 hours. Anemia Panel: No results for input(s): "VITAMINB12", "FOLATE", "FERRITIN", "TIBC", "IRON", "RETICCTPCT" in the last 72 hours. Urine analysis:    Component Value Date/Time   COLORURINE YELLOW 08/29/2023 2142   APPEARANCEUR CLEAR 08/29/2023 2142   APPEARANCEUR Clear 10/14/2018 1132   LABSPEC 1.009 08/29/2023 2142   PHURINE 8.0 08/29/2023 2142   GLUCOSEU NEGATIVE 08/29/2023 2142   HGBUR NEGATIVE 08/29/2023 2142   BILIRUBINUR NEGATIVE 08/29/2023 2142   BILIRUBINUR Negative 10/14/2018 1132   KETONESUR NEGATIVE 08/29/2023 2142   PROTEINUR NEGATIVE 08/29/2023 2142   UROBILINOGEN 1.0 06/06/2016 1012   UROBILINOGEN 0.2 12/04/2012 1755   NITRITE NEGATIVE 08/29/2023 2142   LEUKOCYTESUR NEGATIVE 08/29/2023 2142   Sepsis Labs: @LABRCNTIP (procalcitonin:4,lacticidven:4)  ) Recent Results (from the past 240 hours)  Blood Culture (routine x 2)     Status: None (Preliminary result)   Collection Time: 08/29/23  8:10 PM   Specimen: BLOOD RIGHT ARM  Result Value Ref Range Status   Specimen Description   Final    BLOOD RIGHT ARM Performed at [facility], [address] [place], [place] [address]    Special Requests   Final    BOTTLES DRAWN AEROBIC AND ANAEROBIC Blood Culture results may not be optimal due to an inadequate volume of blood received in culture bottles Performed at [facility], [address] [place], [place]  [address]    Culture PENDING  Incomplete   Report Status PENDING  Incomplete  Blood Culture (routine x 2)     Status: None (Preliminary result)   Collection Time: 08/29/23  8:15 PM   Specimen: BLOOD  Result Value Ref Range Status   Specimen Description   Final    BLOOD RIGHT ANTECUBITAL Performed at [facility], [address], [place], [place] [address]    Special Requests   Final    BOTTLES DRAWN AEROBIC AND ANAEROBIC Blood Culture results may not be optimal due to an inadequate volume of blood received in culture bottles Performed at [facility], [address] [place], [place] [address]    Culture   Final    NO GROWTH < 12 HOURS Performed at [facility], [address], [place], [place] [address]    Report Status PENDING  Incomplete         Radiology Studies: DG Chest Port 1 View Result Date: 08/29/2023 CLINICAL DATA:  Questionable sepsis EXAM: PORTABLE CHEST 1 VIEW COMPARISON:  05/24/2017 FINDINGS: Heart and mediastinal contours are within normal limits. No focal opacities or effusions. No acute  bony abnormality. IMPRESSION: No active disease. Electronically Signed   By: Janeece Mechanic M.D.   On: 08/29/2023 20:34        Scheduled Meds:  enoxaparin  (LOVENOX ) injection  40 mg Subcutaneous Q24H   nystatin    Topical BID   pantoprazole   40 mg Oral BID   sodium chloride  flush  3 mL Intravenous Q12H   sodium chloride  flush  3 mL Intravenous Q12H   tamsulosin   0.4 mg Oral Q1400   Continuous Infusions:  sodium chloride      ceFEPime (MAXIPIME) IV 2 g (08/30/23 1010)   fluconazole (DIFLUCAN) IV 200 mg (08/30/23 0211)   vancomycin  1,250 mg (08/30/23 1529)     LOS: 0 days    Time spent: 55 minutes.    Fonnie Iba, MD  [facility] Pager #: [phone] 7PM-7AM contact night coverage as above

## 2023-08-31 DIAGNOSIS — L03311 Cellulitis of abdominal wall: Secondary | ICD-10-CM | POA: Diagnosis not present

## 2023-08-31 LAB — RENAL FUNCTION PANEL
Albumin: 2.6 g/dL — ABNORMAL LOW (ref 3.5–5.0)
Anion gap: 9 (ref 5–15)
BUN: 7 mg/dL — ABNORMAL LOW (ref 8–23)
CO2: 28 mmol/L (ref 22–32)
Calcium: 8.6 mg/dL — ABNORMAL LOW (ref 8.9–10.3)
Chloride: 97 mmol/L — ABNORMAL LOW (ref 98–111)
Creatinine, Ser: 0.89 mg/dL (ref 0.61–1.24)
GFR, Estimated: >60 mL/min (ref >60)
Glucose, Bld: 97 mg/dL (ref 70–99)
Phosphorus: 3.8 mg/dL (ref 2.5–4.6)
Potassium: 3.9 mmol/L (ref 3.5–5.1)
Sodium: 134 mmol/L — ABNORMAL LOW (ref 135–145)

## 2023-08-31 LAB — CBC WITH DIFFERENTIAL/PLATELET
Abs Immature Granulocytes: 0.16 10*3/uL — ABNORMAL HIGH (ref 0.00–0.07)
Basophils Absolute: 0.1 10*3/uL (ref 0.0–0.1)
Basophils Relative: 0 %
Eosinophils Absolute: 0 10*3/uL (ref 0.0–0.5)
Eosinophils Relative: 0 %
HCT: 35.1 % — ABNORMAL LOW (ref 39.0–52.0)
Hemoglobin: 11.3 g/dL — ABNORMAL LOW (ref 13.0–17.0)
Immature Granulocytes: 1 %
Lymphocytes Relative: 12 %
Lymphs Abs: 1.6 10*3/uL (ref 0.7–4.0)
MCH: 26.7 pg (ref 26.0–34.0)
MCHC: 32.2 g/dL (ref 30.0–36.0)
MCV: 83 fL (ref 80.0–100.0)
Monocytes Absolute: 0.6 10*3/uL (ref 0.1–1.0)
Monocytes Relative: 4 %
Neutro Abs: 11.3 10*3/uL — ABNORMAL HIGH (ref 1.7–7.7)
Neutrophils Relative %: 83 %
Platelets: 357 10*3/uL (ref 150–400)
RBC: 4.23 MIL/uL (ref 4.22–5.81)
RDW: 16.1 % — ABNORMAL HIGH (ref 11.5–15.5)
WBC: 13.8 10*3/uL — ABNORMAL HIGH (ref 4.0–10.5)
nRBC: 0 % (ref 0.0–0.2)

## 2023-08-31 MED ORDER — AMOXICILLIN-POT CLAVULANATE 875-125 MG PO TABS: 1.0000 | ORAL_TABLET | Freq: Two times a day (BID) | ORAL | 0 refills | Status: AC

## 2023-08-31 MED ORDER — NYSTATIN 100000 UNIT/GM EX POWD: Freq: Two times a day (BID) | CUTANEOUS | 0 refills | Status: AC

## 2023-08-31 MED ORDER — ACETAMINOPHEN 325 MG PO TABS: 650.0000 mg | ORAL_TABLET | Freq: Four times a day (QID) | ORAL | Status: AC | PRN

## 2023-08-31 MED ORDER — DOXYCYCLINE HYCLATE 100 MG PO TABS
100.0000 mg | ORAL_TABLET | Freq: Two times a day (BID) | ORAL | 0 refills | Status: AC
Start: 2023-08-31 — End: 2023-09-05

## 2023-08-31 MED ORDER — VANCOMYCIN HCL 1.25 G IV SOLR
1250.0000 mg | Freq: Two times a day (BID) | INTRAVENOUS | Status: DC
  Filled 2023-08-31 (×2): qty 25

## 2023-08-31 MED ORDER — TAMSULOSIN HCL 0.4 MG PO CAPS: 0.4000 mg | ORAL_CAPSULE | Freq: Every day | ORAL | 6 refills | Status: AC

## 2023-08-31 MED ORDER — ENOXAPARIN SODIUM 80 MG/0.8ML IJ SOSY: 70.0000 mg | PREFILLED_SYRINGE | INTRAMUSCULAR | Status: DC

## 2023-08-31 NOTE — Plan of Care (Signed)
  Problem: Clinical Measurements: Goal: Diagnostic test results will improve Outcome: Progressing   Problem: Nutrition: Goal: Adequate nutrition will be maintained Outcome: Progressing   Problem: Skin Integrity: Goal: Risk for impaired skin integrity will decrease Outcome: Progressing   

## 2023-08-31 NOTE — Progress Notes (Addendum)
 Patient given full bath and wound care completed. Pictures taken with patient's phone during each step for reference. Answered patient's questions. Supplies given for wound care at home. Area looks less red than yesterday.

## 2023-08-31 NOTE — TOC Transition Note (Signed)
 Transition of Care [facility]) - Discharge Note   Patient Details  Name: Christian Guerra MRN: [id number] Date of Birth: 1960/06/12  [facility]) CM/SW Contact:  Levie Ream, RN Phone Number: 08/31/2023, 2:58 PM   Clinical Narrative:    D/C orders received; pt notified unable to secure [facility] agency; pt /family to be educated by bedside RN; no TOC needs.   Final next level of care: Home/Self Care Barriers to Discharge: No Barriers Identified   Patient Goals and CMS Choice Patient states their goals for this hospitalization and ongoing recovery are:: To return home CMS Medicare.gov Compare Post Acute Care list provided to:: Other (Comment Required) (NA) Choice offered to / list presented to : NA [facility] ownership interest in [facility].provided to:: Parent NA    Discharge Placement                       Discharge Plan and Services Additional resources added to the After Visit Summary for   In-house Referral: NA Discharge Planning Services: CM Consult Post Acute Care Choice: NA          DME Arranged: N/A DME Agency: NA       HH Arranged: NA HH Agency: NA        Social Drivers of Health (SDOH) Interventions SDOH Screenings   Food Insecurity: Food Insecurity Present (08/30/2023)  Housing: Low Risk  (08/30/2023)  Transportation Needs: No Transportation Needs (08/30/2023)  Utilities: Not At Risk (08/30/2023)  Alcohol Screen: Low Risk  (08/30/2020)  Depression (PHQ2-9): High Risk (03/06/2023)  Financial Resource Strain: Not at Risk (07/09/2022)   Received from [place], [place]  Physical Activity: Not on File (05/29/2022)   Received from [place], [place]  Social Connections: Not on File (01/01/2023)   Received from [facility]  Stress: Not on File (05/29/2022)   [facility]  Tobacco Use: High Risk (08/30/2023)     Readmission Risk Interventions    08/30/2023    1:45 PM  Readmission Risk Prevention Plan  Transportation Screening Complete   PCP or Specialist Appt within 5-7 Days Complete  Home Care Screening Complete  Medication Review (RN CM) Complete

## 2023-08-31 NOTE — TOC Progression Note (Addendum)
 Transition of Care [facility]) - Progression Note    Patient Details  Name: Christian Guerra MRN: [id number] Date of Birth: 1960/12/20  Transition of Care [facility]) CM/SW Contact  Levie Ream, RN Phone Number: 08/31/2023, 1:41 PM  Clinical Narrative:    Orders received for [facility] for wound care and meds, and HH aide; spoke w/ pt in room, and he agrees to recc services; pt says he does not have an agency preference; spoke w/ Amy at [facility], and she says unable to accept; sioke w/ Randel Buss at [place]; he says has to see if referral can be accepted; awaiting return call.  -1352- notified by Randel Buss at [place] unable to accept referral; called Baruch Likens at [facility]; she will contact admin on call, and notify this RN, CM w/ decision; awaiting return call  -1400- LVM Glenda Mercer at [facility]; awaiting return call  -1403- spoke w/ Renetta Carter at [facility]; she says agency unable to accept referral  -1425- notified by Concepcion Deck at [facility] agency unable to accept referral  -1455- notified by Baruch Likens at [facility] agency unable to accept referral; Dr Sandria Cruise notified via secure chat; pt also notified.   Expected Discharge Plan: Home/Self Care Barriers to Discharge: Continued Medical Work up  Expected Discharge Plan and Services In-house Referral: NA Discharge Planning Services: CM Consult Post Acute Care Choice: NA Living arrangements for the past 2 months: Single Family Home Expected Discharge Date: 08/31/23               DME Arranged: N/A DME Agency: NA       HH Arranged: NA HH Agency: NA         Social Determinants of Health (SDOH) Interventions SDOH Screenings   Food Insecurity: Food Insecurity Present (08/30/2023)  Housing: Low Risk  (08/30/2023)  Transportation Needs: No Transportation Needs (08/30/2023)  Utilities: Not At Risk (08/30/2023)  Alcohol Screen: Low Risk  (08/30/2020)  Depression (PHQ2-9): High Risk (03/06/2023)  Financial Resource Strain: Not at Risk (07/09/2022)    Received from [place], [place]  Physical Activity: Not on File (05/29/2022)   Received from [place], [place]  Social Connections: Not on File (01/01/2023)   Received from [facility]  Stress: Not on File (05/29/2022)   [facility]  Tobacco Use: High Risk (08/30/2023)    Readmission Risk Interventions    08/30/2023    1:45 PM  Readmission Risk Prevention Plan  Transportation Screening Complete  PCP or Specialist Appt within 5-7 Days Complete  Home Care Screening Complete  Medication Review (RN CM) Complete

## 2023-08-31 NOTE — Discharge Summary (Signed)
 Physician Discharge Summary  Patient ID: Christian Guerra MRN: [id number] DOB/AGE: 09-18-1960 63 y.o.  Admit date: 08/29/2023 Discharge date: 08/31/2023  Admission Diagnoses:  Discharge Diagnoses:  Principal Problem:   Cellulitis of abdominal wall Active Problems:   Chronic hepatitis C virus infection (HCC)   Essential hypertension   Generalized anxiety disorder   Chronic diastolic CHF (congestive heart failure) (HCC)   Hypokalemia   Abdominal wall cellulitis   Fungal infection of skin of abdomen   H/O leukocytosis   Bipolar disorder (HCC)   NASH (nonalcoholic steatohepatitis)   Discharged Condition: stable  Hospital Course:  Patient is a 63 year old male, morbidly obese, with past medical history significant for neutrophilic leukocytosis (follows with outpatient hematology team), chronic axillary lymphadenopathy (question Hodgkin's lymphoma), chronic hep C infection, essential hypertension, generalized anxiety, depression, diastolic CHF, bipolar, insomnia and NASH.  Patient was admitted with worsening Candida intertrigo involving lower abdominal area and/or cellulitis.  Patient was admitted and treated with Nystatin  powder, IV fluconazole, IV cefepime and Vancomycin .  Skin changes have improved significantly.  Patient will be discharged on Nystatin  powder and oral antibiotics.   Candida intertrigo lower abdominal wall area/possible cellulitis: -See above documentation -Managed with Fluconazole, Nystatin  Powder. - Patient was also treated with IV vancomycin  and cefepime. -Patient will discharged on Nystatin  powder and oral antibiotics   Hypertension: - Controlled.   Morbid obesity: - Diet and exercise.   OSA: - No longer using CPAP machine.   Generalized anxiety disorder/bipolar disorder: - Stable.   Chronic leukocytosis: - WBC of 11.9. -Left shift noted.   NASH: History of chronic hep C: - Consider GI referral on discharge.    Consults: None  Significant  Diagnostic Studies:  PORTABLE CHEST 1 VIEW:   COMPARISON:  05/24/2017   FINDINGS: Heart and mediastinal contours are within normal limits. No focal opacities or effusions. No acute bony abnormality.   IMPRESSION: No active disease.     Electronically Signed   By: Janeece Mechanic M.D.   On: 08/29/2023 20:34  Treatments: Treated with Fluconazole, Antibiotics and Nystatin  powder.  Discharge Exam: Blood pressure (!) 121/51, pulse (!) 103, temperature 98 F (36.7 C), resp. rate 20, height 5\' 6"  (1.676 m), weight (!) 145.6 kg, SpO2 100%.   Disposition: Discharge disposition: 06-Home-Health Care Svc       Discharge Instructions     Diet - low sodium heart healthy   Complete by: As directed    Discharge wound care:   Complete by: As directed    Continue current wound care   Increase activity slowly   Complete by: As directed       Allergies as of 08/31/2023       Reactions   Codeine Itching   Quetiapine Hives        Medication List     STOP taking these medications    clindamycin 300 MG capsule Commonly known as: CLEOCIN   clotrimazole 1 % cream Commonly known as: LOTRIMIN   fluconazole 200 MG tablet Commonly known as: DIFLUCAN   lurasidone  20 MG Tabs tablet Commonly known as: LATUDA    minocycline  100 MG capsule Commonly known as: MINOCIN    nystatin -triamcinolone  cream Commonly known as: MYCOLOG II   traZODone  100 MG tablet Commonly known as: DESYREL        TAKE these medications    acetaminophen  325 MG tablet Commonly known as: TYLENOL  Take 2 tablets (650 mg total) by mouth every 6 (six) hours as needed for mild pain (pain score 1-3) (or  Fever >/= 101).   albuterol  108 (90 Base) MCG/ACT inhaler Commonly known as: VENTOLIN  HFA Inhale 1 puff into the lungs every 6 (six) hours as needed for wheezing or shortness of breath.   amoxicillin-clavulanate 875-125 MG tablet Commonly known as: AUGMENTIN Take 1 tablet by mouth 2 (two) times daily  for 5 days.   atenolol  50 MG tablet Commonly known as: TENORMIN  Take 1 tablet (50 mg total) by mouth daily.   doxycycline  100 MG tablet Commonly known as: VIBRA -TABS Take 1 tablet (100 mg total) by mouth 2 (two) times daily for 5 days.   gabapentin  800 MG tablet Commonly known as: NEURONTIN  Take 800 mg by mouth 3 (three) times daily.   hydrochlorothiazide  25 MG tablet Commonly known as: HYDRODIURIL  Take 1 tablet (25 mg total) by mouth daily.   hydrOXYzine  25 MG tablet Commonly known as: ATARAX  Take 25 mg by mouth 2 (two) times daily as needed for anxiety.   nystatin  powder Commonly known as: MYCOSTATIN /NYSTOP  Apply topically 2 (two) times daily for 14 days.   pantoprazole  40 MG tablet Commonly known as: PROTONIX  Take 1 tablet (40 mg total) by mouth 2 (two) times daily. What changed: when to take this   tamsulosin  0.4 MG Caps capsule Commonly known as: FLOMAX  Take 1 capsule (0.4 mg total) by mouth daily at 2 PM.   traMADol 50 MG tablet Commonly known as: ULTRAM Take 50 mg by mouth 4 (four) times daily as needed for moderate pain (pain score 4-6) or severe pain (pain score 7-10).   Wegovy 1.7 MG/0.75ML Soaj Generic drug: Semaglutide-Weight Management Inject 1.7 mg into the skin once a week. Friday               Discharge Care Instructions  (From admission, onward)           Start     Ordered   08/31/23 0000  Discharge wound care:       Comments: Continue current wound care   08/31/23 1248           Time spent: 35 Minutes.  SignedDoroteo Gasmen 08/31/2023, 12:48 PM

## 2023-08-31 NOTE — Plan of Care (Signed)

## 2023-09-02 ENCOUNTER — Ambulatory Visit

## 2023-09-03 LAB — CULTURE, BLOOD (ROUTINE X 2): Culture: NO GROWTH

## 2023-09-04 ENCOUNTER — Encounter (HOSPITAL_BASED_OUTPATIENT_CLINIC_OR_DEPARTMENT_OTHER): Attending: Internal Medicine | Admitting: Internal Medicine

## 2023-09-04 DIAGNOSIS — I110 Hypertensive heart disease with heart failure: Secondary | ICD-10-CM | POA: Diagnosis not present

## 2023-09-04 DIAGNOSIS — I5032 Chronic diastolic (congestive) heart failure: Secondary | ICD-10-CM | POA: Diagnosis not present

## 2023-09-04 DIAGNOSIS — X58XXXA Exposure to other specified factors, initial encounter: Secondary | ICD-10-CM | POA: Diagnosis not present

## 2023-09-04 DIAGNOSIS — B372 Candidiasis of skin and nail: Secondary | ICD-10-CM | POA: Diagnosis not present

## 2023-09-04 DIAGNOSIS — S31109A Unspecified open wound of abdominal wall, unspecified quadrant without penetration into peritoneal cavity, initial encounter: Secondary | ICD-10-CM | POA: Insufficient documentation

## 2023-09-04 DIAGNOSIS — L03311 Cellulitis of abdominal wall: Secondary | ICD-10-CM | POA: Diagnosis not present

## 2023-09-04 DIAGNOSIS — G4733 Obstructive sleep apnea (adult) (pediatric): Secondary | ICD-10-CM | POA: Insufficient documentation

## 2023-09-04 DIAGNOSIS — L304 Erythema intertrigo: Secondary | ICD-10-CM | POA: Diagnosis not present

## 2023-09-04 LAB — CULTURE, BLOOD (ROUTINE X 2)

## 2023-09-10 ENCOUNTER — Encounter (HOSPITAL_BASED_OUTPATIENT_CLINIC_OR_DEPARTMENT_OTHER): Admitting: Internal Medicine

## 2023-09-10 DIAGNOSIS — B3789 Other sites of candidiasis: Secondary | ICD-10-CM

## 2023-09-10 DIAGNOSIS — S31109D Unspecified open wound of abdominal wall, unspecified quadrant without penetration into peritoneal cavity, subsequent encounter: Secondary | ICD-10-CM

## 2023-09-10 DIAGNOSIS — S31109A Unspecified open wound of abdominal wall, unspecified quadrant without penetration into peritoneal cavity, initial encounter: Secondary | ICD-10-CM | POA: Diagnosis not present

## 2023-09-18 ENCOUNTER — Encounter (HOSPITAL_BASED_OUTPATIENT_CLINIC_OR_DEPARTMENT_OTHER): Admitting: Internal Medicine

## 2023-09-18 DIAGNOSIS — B3789 Other sites of candidiasis: Secondary | ICD-10-CM

## 2023-09-18 DIAGNOSIS — S31109A Unspecified open wound of abdominal wall, unspecified quadrant without penetration into peritoneal cavity, initial encounter: Secondary | ICD-10-CM | POA: Diagnosis not present

## 2023-09-18 DIAGNOSIS — S31109D Unspecified open wound of abdominal wall, unspecified quadrant without penetration into peritoneal cavity, subsequent encounter: Secondary | ICD-10-CM

## 2023-09-23 ENCOUNTER — Ambulatory Visit (HOSPITAL_BASED_OUTPATIENT_CLINIC_OR_DEPARTMENT_OTHER): Admitting: General Surgery

## 2023-09-24 ENCOUNTER — Encounter (HOSPITAL_BASED_OUTPATIENT_CLINIC_OR_DEPARTMENT_OTHER): Attending: Internal Medicine | Admitting: Internal Medicine

## 2023-09-24 DIAGNOSIS — I110 Hypertensive heart disease with heart failure: Secondary | ICD-10-CM | POA: Insufficient documentation

## 2023-09-24 DIAGNOSIS — X58XXXA Exposure to other specified factors, initial encounter: Secondary | ICD-10-CM | POA: Diagnosis not present

## 2023-09-24 DIAGNOSIS — I509 Heart failure, unspecified: Secondary | ICD-10-CM | POA: Insufficient documentation

## 2023-09-24 DIAGNOSIS — G4733 Obstructive sleep apnea (adult) (pediatric): Secondary | ICD-10-CM | POA: Insufficient documentation

## 2023-09-24 DIAGNOSIS — F319 Bipolar disorder, unspecified: Secondary | ICD-10-CM | POA: Diagnosis not present

## 2023-09-24 DIAGNOSIS — B3789 Other sites of candidiasis: Secondary | ICD-10-CM | POA: Insufficient documentation

## 2023-09-24 DIAGNOSIS — S31109A Unspecified open wound of abdominal wall, unspecified quadrant without penetration into peritoneal cavity, initial encounter: Secondary | ICD-10-CM | POA: Insufficient documentation

## 2023-10-01 ENCOUNTER — Encounter (HOSPITAL_BASED_OUTPATIENT_CLINIC_OR_DEPARTMENT_OTHER): Admitting: Internal Medicine

## 2023-10-01 DIAGNOSIS — S31109A Unspecified open wound of abdominal wall, unspecified quadrant without penetration into peritoneal cavity, initial encounter: Secondary | ICD-10-CM | POA: Diagnosis not present

## 2023-10-01 DIAGNOSIS — B3789 Other sites of candidiasis: Secondary | ICD-10-CM | POA: Diagnosis not present

## 2023-10-01 DIAGNOSIS — S31109D Unspecified open wound of abdominal wall, unspecified quadrant without penetration into peritoneal cavity, subsequent encounter: Secondary | ICD-10-CM

## 2023-10-15 ENCOUNTER — Encounter (HOSPITAL_BASED_OUTPATIENT_CLINIC_OR_DEPARTMENT_OTHER): Admitting: Internal Medicine

## 2023-10-15 DIAGNOSIS — B3789 Other sites of candidiasis: Secondary | ICD-10-CM | POA: Diagnosis not present

## 2023-10-15 DIAGNOSIS — S31109D Unspecified open wound of abdominal wall, unspecified quadrant without penetration into peritoneal cavity, subsequent encounter: Secondary | ICD-10-CM

## 2023-10-29 ENCOUNTER — Encounter (HOSPITAL_BASED_OUTPATIENT_CLINIC_OR_DEPARTMENT_OTHER): Attending: Internal Medicine | Admitting: Internal Medicine

## 2023-10-29 DIAGNOSIS — B3789 Other sites of candidiasis: Secondary | ICD-10-CM | POA: Diagnosis not present

## 2023-10-29 DIAGNOSIS — S31109D Unspecified open wound of abdominal wall, unspecified quadrant without penetration into peritoneal cavity, subsequent encounter: Secondary | ICD-10-CM | POA: Insufficient documentation

## 2023-11-12 ENCOUNTER — Encounter (HOSPITAL_BASED_OUTPATIENT_CLINIC_OR_DEPARTMENT_OTHER): Admitting: Internal Medicine

## 2023-11-12 DIAGNOSIS — S31109D Unspecified open wound of abdominal wall, unspecified quadrant without penetration into peritoneal cavity, subsequent encounter: Secondary | ICD-10-CM | POA: Diagnosis not present

## 2023-11-12 DIAGNOSIS — B3789 Other sites of candidiasis: Secondary | ICD-10-CM

## 2023-11-27 ENCOUNTER — Encounter (HOSPITAL_BASED_OUTPATIENT_CLINIC_OR_DEPARTMENT_OTHER): Attending: Internal Medicine | Admitting: Internal Medicine

## 2023-11-27 DIAGNOSIS — X58XXXA Exposure to other specified factors, initial encounter: Secondary | ICD-10-CM | POA: Diagnosis not present

## 2023-11-27 DIAGNOSIS — S31109D Unspecified open wound of abdominal wall, unspecified quadrant without penetration into peritoneal cavity, subsequent encounter: Secondary | ICD-10-CM | POA: Diagnosis not present

## 2023-11-27 DIAGNOSIS — S31109A Unspecified open wound of abdominal wall, unspecified quadrant without penetration into peritoneal cavity, initial encounter: Secondary | ICD-10-CM | POA: Insufficient documentation

## 2023-11-27 DIAGNOSIS — B3789 Other sites of candidiasis: Secondary | ICD-10-CM | POA: Diagnosis not present

## 2023-12-11 ENCOUNTER — Encounter (HOSPITAL_BASED_OUTPATIENT_CLINIC_OR_DEPARTMENT_OTHER): Admitting: Internal Medicine

## 2023-12-11 DIAGNOSIS — S31109D Unspecified open wound of abdominal wall, unspecified quadrant without penetration into peritoneal cavity, subsequent encounter: Secondary | ICD-10-CM | POA: Diagnosis not present

## 2023-12-11 DIAGNOSIS — B3789 Other sites of candidiasis: Secondary | ICD-10-CM | POA: Diagnosis not present

## 2024-01-13 ENCOUNTER — Encounter (HOSPITAL_BASED_OUTPATIENT_CLINIC_OR_DEPARTMENT_OTHER): Attending: Internal Medicine | Admitting: Internal Medicine

## 2024-01-13 DIAGNOSIS — S31109D Unspecified open wound of abdominal wall, unspecified quadrant without penetration into peritoneal cavity, subsequent encounter: Secondary | ICD-10-CM | POA: Diagnosis not present

## 2024-01-13 DIAGNOSIS — B3789 Other sites of candidiasis: Secondary | ICD-10-CM | POA: Diagnosis not present

## 2024-02-12 ENCOUNTER — Ambulatory Visit (HOSPITAL_BASED_OUTPATIENT_CLINIC_OR_DEPARTMENT_OTHER): Admitting: Internal Medicine

## 2024-02-24 ENCOUNTER — Encounter (HOSPITAL_BASED_OUTPATIENT_CLINIC_OR_DEPARTMENT_OTHER): Attending: Internal Medicine | Admitting: Internal Medicine

## 2024-02-24 DIAGNOSIS — B3789 Other sites of candidiasis: Secondary | ICD-10-CM | POA: Diagnosis not present

## 2024-02-24 DIAGNOSIS — S31109D Unspecified open wound of abdominal wall, unspecified quadrant without penetration into peritoneal cavity, subsequent encounter: Secondary | ICD-10-CM | POA: Diagnosis not present

## 2024-02-27 LAB — COLOGUARD: COLOGUARD: NEGATIVE

## 2024-03-23 ENCOUNTER — Encounter (HOSPITAL_BASED_OUTPATIENT_CLINIC_OR_DEPARTMENT_OTHER): Attending: Internal Medicine | Admitting: Internal Medicine

## 2024-03-23 DIAGNOSIS — S31109D Unspecified open wound of abdominal wall, unspecified quadrant without penetration into peritoneal cavity, subsequent encounter: Secondary | ICD-10-CM

## 2024-03-23 DIAGNOSIS — S31109A Unspecified open wound of abdominal wall, unspecified quadrant without penetration into peritoneal cavity, initial encounter: Secondary | ICD-10-CM | POA: Insufficient documentation

## 2024-03-23 DIAGNOSIS — B3789 Other sites of candidiasis: Secondary | ICD-10-CM | POA: Diagnosis not present

## 2024-03-23 DIAGNOSIS — G4733 Obstructive sleep apnea (adult) (pediatric): Secondary | ICD-10-CM | POA: Diagnosis not present

## 2024-03-23 DIAGNOSIS — X58XXXA Exposure to other specified factors, initial encounter: Secondary | ICD-10-CM | POA: Diagnosis not present

## 2024-03-23 DIAGNOSIS — I509 Heart failure, unspecified: Secondary | ICD-10-CM | POA: Diagnosis not present

## 2024-03-23 DIAGNOSIS — I110 Hypertensive heart disease with heart failure: Secondary | ICD-10-CM | POA: Diagnosis not present

## 2024-04-20 ENCOUNTER — Encounter (HOSPITAL_BASED_OUTPATIENT_CLINIC_OR_DEPARTMENT_OTHER): Admitting: Internal Medicine

## 2024-04-27 ENCOUNTER — Encounter (HOSPITAL_BASED_OUTPATIENT_CLINIC_OR_DEPARTMENT_OTHER): Admitting: Internal Medicine

## 2024-05-18 ENCOUNTER — Encounter (HOSPITAL_BASED_OUTPATIENT_CLINIC_OR_DEPARTMENT_OTHER): Admitting: Internal Medicine
